# Patient Record
Sex: Female | Born: 1945 | Race: White | Hispanic: No | Smoking: Never smoker
Health system: Southern US, Community
[De-identification: ages and names within clinical notes are randomized; demographics above are authoritative.]

## PROBLEM LIST (undated history)

## (undated) DIAGNOSIS — Z8719 Personal history of other diseases of the digestive system: Secondary | ICD-10-CM

## (undated) DIAGNOSIS — F32A Depression, unspecified: Secondary | ICD-10-CM

## (undated) DIAGNOSIS — F329 Major depressive disorder, single episode, unspecified: Secondary | ICD-10-CM

## (undated) DIAGNOSIS — M810 Age-related osteoporosis without current pathological fracture: Secondary | ICD-10-CM

## (undated) DIAGNOSIS — K219 Gastro-esophageal reflux disease without esophagitis: Secondary | ICD-10-CM

## (undated) DIAGNOSIS — Z86718 Personal history of other venous thrombosis and embolism: Secondary | ICD-10-CM

## (undated) DIAGNOSIS — T7840XA Allergy, unspecified, initial encounter: Secondary | ICD-10-CM

## (undated) DIAGNOSIS — Z860100 Personal history of colon polyps, unspecified: Secondary | ICD-10-CM

## (undated) DIAGNOSIS — R011 Cardiac murmur, unspecified: Secondary | ICD-10-CM

## (undated) DIAGNOSIS — E785 Hyperlipidemia, unspecified: Secondary | ICD-10-CM

## (undated) HISTORY — DX: Allergy, unspecified, initial encounter: T78.40XA

## (undated) HISTORY — DX: Hyperlipidemia, unspecified: E78.5

## (undated) HISTORY — DX: Age-related osteoporosis without current pathological fracture: M81.0

## (undated) HISTORY — DX: Depression, unspecified: F32.A

## (undated) HISTORY — DX: Major depressive disorder, single episode, unspecified: F32.9

## (undated) HISTORY — DX: Personal history of other diseases of the digestive system: Z87.19

## (undated) HISTORY — DX: Personal history of other venous thrombosis and embolism: Z86.718

## (undated) HISTORY — DX: Cardiac murmur, unspecified: R01.1

## (undated) HISTORY — PX: DILATION AND CURETTAGE OF UTERUS: SHX78

## (undated) HISTORY — PX: KNEE SURGERY: SHX244

## (undated) HISTORY — DX: Gastro-esophageal reflux disease without esophagitis: K21.9

## (undated) HISTORY — DX: Personal history of colon polyps, unspecified: Z86.0100

## (undated) HISTORY — PX: SHOULDER SURGERY: SHX246

## (undated) HISTORY — DX: Personal history of colonic polyps: Z86.010

---

## 1970-04-19 HISTORY — PX: APPENDECTOMY: SHX54

## 1999-04-20 HISTORY — PX: LEG SURGERY: SHX1003

## 2011-01-06 DIAGNOSIS — M79604 Pain in right leg: Secondary | ICD-10-CM | POA: Insufficient documentation

## 2011-01-06 DIAGNOSIS — I872 Venous insufficiency (chronic) (peripheral): Secondary | ICD-10-CM | POA: Insufficient documentation

## 2014-04-19 HISTORY — PX: HIATAL HERNIA REPAIR: SHX195

## 2015-02-20 ENCOUNTER — Ambulatory Visit (INDEPENDENT_AMBULATORY_CARE_PROVIDER_SITE_OTHER): Payer: Medicare Other | Admitting: Medical

## 2015-02-20 ENCOUNTER — Encounter: Payer: Self-pay | Admitting: Medical

## 2015-02-20 VITALS — BP 120/80 | HR 88 | Temp 98.3°F | Ht 63.0 in | Wt 213.0 lb

## 2015-02-20 DIAGNOSIS — S0101XD Laceration without foreign body of scalp, subsequent encounter: Secondary | ICD-10-CM | POA: Diagnosis not present

## 2015-02-20 DIAGNOSIS — Z4802 Encounter for removal of sutures: Secondary | ICD-10-CM | POA: Diagnosis not present

## 2015-02-20 NOTE — Progress Notes (Signed)
Subjective:    Patient ID: Suszanne ConnersShelia Valadez, female    DOB: 02/11/1946, 69 y.o.   MRN: [id number]  HPI   I have reviewed pt PMH, PSH, FH, Social History and Surgical History  Pt states moved here 2 yrs ago from [place].  States has some food allergies but not tested by allergist.  Depression- Pt is on Wellbutrin and Celexa. Pt states old primary care doctor has refilled past 2 years.   Hyperlipidemia- not on any meds. Never took any medications for. Pt thinks she does not need med.  Genella RifeGerd- very rare reflux. Under control for 5 yrs. Will get very rare occasional reflux.  Both parents died car accidents. Dad ate age 71 yo. Mom age 71 yo.   Pt had scalp laceration Tuesday a week ago. Pt fell in her bathroom. Cause of fall not known. She was little dizzy and work up for this. Kept overnight. Pt states work up in hospital was negative.  Pt her to follow up for removal of sutures. Pt was not clear how many days she was told to remove sutures. Pt states this was not on discharge.      Review of Systems  Constitutional: Negative for fever, chills, diaphoresis, activity change and fatigue.  Respiratory: Negative for cough, chest tightness and shortness of breath.   Cardiovascular: Negative for chest pain, palpitations and leg swelling.  Gastrointestinal: Negative for nausea, vomiting and abdominal pain.  Musculoskeletal: Negative for back pain, neck pain and neck stiffness.  Skin:       Scalp laceration.  Neurological: Negative for dizziness, syncope, facial asymmetry, speech difficulty, weakness and headaches.  Psychiatric/Behavioral: Negative for behavioral problems, confusion and agitation. The patient is not nervous/anxious.     Past Medical History  Diagnosis Date  . Allergy     food allergies and some additives. But she does not know what will cause.  . Depression   . GERD (gastroesophageal reflux disease)     had in past but now controlled. Has hiatal hernia.  .  Hyperlipidemia     Social History   Social History  . Marital Status: Divorced    Spouse Name: N/A  . Number of Children: N/A  . Years of Education: N/A   Occupational History  . Not on file.   Social History Main Topics  . Smoking status: Never Smoker   . Smokeless tobacco: Never Used  . Alcohol Use: 0.0 oz/week    0 Standard drinks or equivalent per week     Comment: pt  drinks one glass hard cidar at dinner each   . Drug Use: No  . Sexual Activity: No   Other Topics Concern  . Not on file   Social History Narrative  . No narrative on file    Past Surgical History  Procedure Laterality Date  . Knee surgery Bilateral   . Shoulder surgery Right     Family History  Problem Relation Age of Onset  . Diabetes Mother     No Known Allergies  No current outpatient prescriptions on file prior to visit.   No current facility-administered medications on file prior to visit.    BP 120/80 mmHg  Pulse 88  Temp(Src) 98.3 F (36.8 C) (Oral)  Ht 5\' 3"  (1.6 m)  Wt 213 lb (96.616 kg)  BMI 37.74 kg/m2  SpO2 98%       Objective:   Physical Exam   General Mental Status- Alert. General Appearance- Not in acute distress.  Skin General: Color- Normal Color. Moisture- Normal Moisture.  Neck Carotid Arteries- Normal color. Moisture- Normal Moisture. No carotid bruits. No JVD.  Chest and Lung Exam Auscultation: Breath Sounds:-Normal.  Cardiovascular Auscultation:Rythm- Regular. Murmurs & Other Heart Sounds:Auscultation of the heart reveals- No Murmurs.  Abdomen Inspection:-Inspeection Normal. Palpation/Percussion:Note:No mass. Palpation and Percussion of the abdomen reveal- Non Tender, Non Distended + BS, no rebound or guarding.    Neurologic Cranial Nerve exam:- CN III-XII intact(No nystagmus), symmetric smile. Finger to Nose:- Normal/Intact Strength:- 5/5 equal and symmetric strength both upper and lower extremities.     Assessment & Plan:    Removed your sutures today.(8 total)  Pat wash and dry area over next 3 days.  Area should not open up. But since I did not place sutures be cautious.  Follow up in 2-3 wks for and recommend come fasting in the am will get lipid panel and cmp.

## 2015-02-20 NOTE — Progress Notes (Signed)
Pre visit review using our clinic review tool, if applicable. No additional management support is needed unless otherwise documented below in the visit note. 

## 2015-02-20 NOTE — Patient Instructions (Signed)
Removed your sutures today.(8 total)  Pat wash and dry area over next 3 days.  Area should not open up. But since I did not place sutures be cautious.  Follow up in 2-3 wks for and recommend come fasting in the am will get lipid panel and cmp.

## 2015-02-21 ENCOUNTER — Ambulatory Visit: Payer: Self-pay | Admitting: Family

## 2015-03-07 ENCOUNTER — Ambulatory Visit: Payer: Self-pay | Admitting: Family

## 2015-04-23 ENCOUNTER — Telehealth: Payer: Self-pay | Admitting: *Deleted

## 2015-04-23 ENCOUNTER — Other Ambulatory Visit: Payer: Self-pay | Admitting: Family

## 2015-04-23 ENCOUNTER — Ambulatory Visit: Payer: Medicare Other | Admitting: Family

## 2015-04-23 MED ORDER — BUPROPION HCL ER (XL) 300 MG PO TB24: 300.0000 mg | ORAL_TABLET | ORAL | Status: DC

## 2015-04-23 MED ORDER — CITALOPRAM HYDROBROMIDE 40 MG PO TABS: 40.0000 mg | ORAL_TABLET | Freq: Every day | ORAL | Status: DC

## 2015-04-23 NOTE — Telephone Encounter (Signed)
OK 

## 2015-04-23 NOTE — Telephone Encounter (Signed)
30 days supply sent. Notified pt.

## 2015-04-23 NOTE — Telephone Encounter (Signed)
Pt came in to the office for her appointment today and did not realize that she cancelled her appt through the automated reminder system (has done this 3 times now). Pt has r/s to establish care with Peggyann Juba'Sullivan, NP for 05/01/14 but is need of refills on wellbutrin and celexa. Pt saw Ramon Dredgedward for an acute visit on 02/20/15. Ok to send 30 day supply to pharmacy?

## 2015-05-02 ENCOUNTER — Encounter: Payer: Self-pay | Admitting: Family

## 2015-05-02 ENCOUNTER — Ambulatory Visit (INDEPENDENT_AMBULATORY_CARE_PROVIDER_SITE_OTHER): Payer: Medicare Other | Admitting: Family

## 2015-05-02 VITALS — BP 143/73 | HR 78 | Temp 98.6°F | Resp 18 | Ht 63.0 in | Wt 213.0 lb

## 2015-05-02 DIAGNOSIS — E785 Hyperlipidemia, unspecified: Secondary | ICD-10-CM | POA: Insufficient documentation

## 2015-05-02 DIAGNOSIS — F32A Depression, unspecified: Secondary | ICD-10-CM

## 2015-05-02 DIAGNOSIS — F329 Major depressive disorder, single episode, unspecified: Secondary | ICD-10-CM

## 2015-05-02 DIAGNOSIS — Z86718 Personal history of other venous thrombosis and embolism: Secondary | ICD-10-CM | POA: Diagnosis not present

## 2015-05-02 DIAGNOSIS — L900 Lichen sclerosus et atrophicus: Secondary | ICD-10-CM | POA: Diagnosis not present

## 2015-05-02 DIAGNOSIS — K219 Gastro-esophageal reflux disease without esophagitis: Secondary | ICD-10-CM | POA: Insufficient documentation

## 2015-05-02 LAB — CBC WITH DIFFERENTIAL/PLATELET
BASOS ABS: 0 10*3/uL (ref 0.0–0.1)
Basophils Relative: 1 % (ref 0–1)
Eosinophils Absolute: 0.1 10*3/uL (ref 0.0–0.7)
Eosinophils Relative: 3 % (ref 0–5)
HEMATOCRIT: 39.5 % (ref 36.0–46.0)
HEMOGLOBIN: 12.9 g/dL (ref 12.0–15.0)
LYMPHS ABS: 1.6 10*3/uL (ref 0.7–4.0)
LYMPHS PCT: 34 % (ref 12–46)
MCH: 27.5 pg (ref 26.0–34.0)
MCHC: 32.7 g/dL (ref 30.0–36.0)
MCV: 84.2 fL (ref 78.0–100.0)
MONOS PCT: 6 % (ref 3–12)
MPV: 8.9 fL (ref 8.6–12.4)
Monocytes Absolute: 0.3 10*3/uL (ref 0.1–1.0)
NEUTROS ABS: 2.7 10*3/uL (ref 1.7–7.7)
NEUTROS PCT: 56 % (ref 43–77)
Platelets: 237 10*3/uL (ref 150–400)
RBC: 4.69 MIL/uL (ref 3.87–5.11)
RDW: 14.3 % (ref 11.5–15.5)
WBC: 4.8 10*3/uL (ref 4.0–10.5)

## 2015-05-02 LAB — LIPID PANEL
CHOLESTEROL: 234 mg/dL — AB (ref 125–200)
HDL: 52 mg/dL (ref >=46)
LDL Cholesterol: 157 mg/dL — ABNORMAL HIGH (ref <130)
TRIGLYCERIDES: 124 mg/dL (ref <150)
Total CHOL/HDL Ratio: 4.5 Ratio (ref <=5.0)
VLDL: 25 mg/dL (ref <30)

## 2015-05-02 MED ORDER — ASPIRIN EC 81 MG PO TBEC: 81.0000 mg | DELAYED_RELEASE_TABLET | Freq: Every day | ORAL | Status: DC

## 2015-05-02 MED ORDER — RANITIDINE HCL 150 MG PO TABS: 150.0000 mg | ORAL_TABLET | Freq: Two times a day (BID) | ORAL | Status: DC

## 2015-05-02 NOTE — Patient Instructions (Addendum)
Please complete lab work prior to leaving. Call your insurance and ask them if it would be less expensive for you to take the standard release wellbutrin 150mg  twice daily rather than the 300mg  of the extended release once daily.  Add aspirin 81mg  once daily to help prevent heart attack and stroke. Add zantac 150mg  twice daily (this is available over the counter).

## 2015-05-02 NOTE — Progress Notes (Signed)
Subjective:    Patient ID: Judy Weiss, female    DOB: 04/26/1945, 70 y.o.   MRN: [id number]  HPI  Judy Weiss is a 70 yr old female who presents today to establish care.  1) Depression- she is on citalopram and wellbutrin for "years."  She reports good mood on this regimen.  Wellbutrin became more expensive.  She denies anxiety. Recently moved from [place] and reports that she is adjusting.  She uses ambien or sleep prn.  Insurance will only pay for 90 tabs/12 months. Usually only takes 1/2 tab.    2) GERD- Reports hx of hiatal hernia.  Only bothers her if she overeats.  Tries to eat small amounts.  She describes some cough, upper airway wheeze and intermittent hoarseness.   3) Hyperlipidemia- she has never taken meds.    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  Later had vein stripping in that leg.  Also had remote episode of L leg cellulitis.  Food intolerances- Reports HA with MSG, swelling with too much salt.  Spices can maker flush and cause HA.  Reports occasional welts but not sure what causes the welts .  Denies hx of tongue/lip swelling. She has not seen allergist recently, declines referral at this time.   Lichen sclerosis- seeing dermatology. She is being treated intermittently with a steroid cream.    Review of Systems See HPI  Past Medical History  Diagnosis Date  . Allergy     food allergies and some additives. But she does not know what will cause.  . Depression   . GERD (gastroesophageal reflux disease)     had in past but now controlled. Has hiatal hernia.  . Hyperlipidemia   . Heart murmur   . History of colon polyps   . History of blood clots 1996?    left leg    Social History   Social History  . Marital Status: Divorced    Spouse Name: N/A  . Number of Children: N/A  . Years of Education: N/A   Occupational History  . Not on file.   Social History Main Topics  . Smoking status: Never Smoker   . Smokeless tobacco: Never Used  . Alcohol  Use: 4.2 oz/week    7 Standard drinks or equivalent per week     Comment: pt  drinks one glass of hard cidar at dinner each day  . Drug Use: No  . Sexual Activity: No   Other Topics Concern  . 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   Divorced   4 cats and a dog   Enjoys antiquing.  Buys and sells.  Enjoys shopping    Past Surgical History  Procedure Laterality Date  . Knee surgery Bilateral     hx of torn meniscus (arthroscopic surgery)  . Shoulder surgery Right     reports hx arthroscopic surgery  . Leg surgery Left 2001    Had veins stripped   . Appendectomy  1972    Family History  Problem Relation Age of Onset  . Diabetes Mother   . Hypothyroidism Mother     No Known Allergies  Current Outpatient Prescriptions on File Prior to Visit  Medication  Sig Dispense Refill  . buPROPion (WELLBUTRIN XL) 300 MG 24 hr tablet Take 1 tablet (300 mg total) by mouth every morning. 30 tablet 0  . citalopram (CELEXA) 40 MG tablet Take 1 tablet (40 mg total) by mouth daily. 30 tablet 0   No current facility-administered medications on file prior to visit.    BP 143/73 mmHg  Pulse 78  Temp(Src) 98.6 F (37 C) (Oral)  Resp 18  Ht 5\' 3"  (1.6 m)  Wt 213 lb (96.616 kg)  BMI 37.74 kg/m2  SpO2 97%       Objective:   Physical Exam  Constitutional: She is oriented to person, place, and time. She appears well-developed and well-nourished.  HENT:  Head: Normocephalic and atraumatic.  Cardiovascular: Normal rate, regular rhythm and normal heart sounds.   No murmur heard. Pulmonary/Chest: Effort normal and breath sounds normal. No respiratory distress. She has no wheezes.  Musculoskeletal: She exhibits no edema.  1+ bilateral LE edema  Neurological: She is alert  and oriented to person, place, and time.  Psychiatric: She has a normal mood and affect. Her behavior is normal. Judgment and thought content normal.          Assessment & Plan:  Pt does report hx of anemia and recent PIC (ice). Check CBC.

## 2015-05-02 NOTE — Progress Notes (Signed)
Pre visit review using our clinic review tool, if applicable. No additional management support is needed unless otherwise documented below in the visit note. 

## 2015-05-02 NOTE — Assessment & Plan Note (Signed)
She reports that this is now stable and is being treated by dermatology.

## 2015-05-02 NOTE — Assessment & Plan Note (Signed)
Occurred while on OCP.

## 2015-05-02 NOTE — Assessment & Plan Note (Signed)
Stable on current meds.  Continue same. 

## 2015-05-02 NOTE — Assessment & Plan Note (Signed)
Obtain lipid panel

## 2015-05-02 NOTE — Assessment & Plan Note (Signed)
Trial of zantac.  Hopefully this will help wheeze/cough and hoarsenss.

## 2015-05-03 ENCOUNTER — Encounter: Payer: Self-pay | Admitting: Family

## 2015-05-13 ENCOUNTER — Telehealth: Payer: Self-pay | Admitting: Family

## 2015-05-13 MED ORDER — ZOLPIDEM TARTRATE 10 MG PO TABS: 10.0000 mg | ORAL_TABLET | Freq: Every evening | ORAL | Status: DC | PRN

## 2015-05-13 NOTE — Telephone Encounter (Signed)
Rx called to pharmacy voicemail. 

## 2015-05-13 NOTE — Telephone Encounter (Signed)
OK to call in ambien 30 tabs.

## 2015-05-13 NOTE — Telephone Encounter (Signed)
Relation to ZO:XWRU Call back number: [phone] Pharmacy:  [id number] - [place], [place] - [address] [phone] (Phone) [phone] (Fax)         Reason for call:  Patient requesting a refill zolpidem (AMBIEN) 10 MG tablet. Patient states she contacted insurance as you advised and there is no difference in the cost of  buPROPion (WELLBUTRIN XL) 300 MG 24 hr tablet if its taking twice a day.

## 2015-05-13 NOTE — Telephone Encounter (Signed)
Spoke with pt. She is needing zolpidem now. She is new to [place] and we have not filled Rx before. Pt states she doesn't need wellbutrin at this time but checked with her insurance and co-pay is the same if she takes it once a day or twice a day.  Please advise?

## 2015-05-22 ENCOUNTER — Telehealth: Payer: Self-pay | Admitting: Family

## 2015-05-22 NOTE — Telephone Encounter (Signed)
Documented 05/22/15.  

## 2015-05-22 NOTE — Telephone Encounter (Signed)
Pt states she does not take the flu shot °

## 2015-06-05 ENCOUNTER — Other Ambulatory Visit: Payer: Self-pay | Admitting: Family

## 2015-06-05 NOTE — Telephone Encounter (Signed)
[id number] - [place], [place] - [address]  Pt would like 3 month supply of each med. Out of citalopram. Has 4 left of buPROPion .

## 2015-06-05 NOTE — Telephone Encounter (Signed)
Last OV 05/02/15  Zolpidem, wellbutrin, and citalopram were all last filled 05/13/15 #30 with 0. Pt is requesting for each med to be filled for 90 days.   Please advise if ok?

## 2015-06-06 MED ORDER — BUPROPION HCL ER (XL) 300 MG PO TB24: 300.0000 mg | ORAL_TABLET | ORAL | Status: DC

## 2015-06-06 MED ORDER — CITALOPRAM HYDROBROMIDE 40 MG PO TABS: 40.0000 mg | ORAL_TABLET | Freq: Every day | ORAL | Status: DC

## 2015-06-06 MED ORDER — ZOLPIDEM TARTRATE 10 MG PO TABS: 10.0000 mg | ORAL_TABLET | Freq: Every evening | ORAL | Status: DC | PRN

## 2015-06-06 NOTE — Telephone Encounter (Signed)
90 day of wellbutrin and citalopram sent, unable to provid 90 day of ambien due to controlled substance status.

## 2015-06-06 NOTE — Telephone Encounter (Signed)
Rx faxed to local pharmacy. Notified pt.

## 2015-08-11 DIAGNOSIS — K219 Gastro-esophageal reflux disease without esophagitis: Secondary | ICD-10-CM | POA: Diagnosis not present

## 2015-08-11 DIAGNOSIS — R1010 Upper abdominal pain, unspecified: Secondary | ICD-10-CM | POA: Diagnosis not present

## 2015-08-11 DIAGNOSIS — K449 Diaphragmatic hernia without obstruction or gangrene: Secondary | ICD-10-CM | POA: Diagnosis not present

## 2015-08-13 DIAGNOSIS — R0689 Other abnormalities of breathing: Secondary | ICD-10-CM | POA: Diagnosis not present

## 2015-08-13 DIAGNOSIS — K449 Diaphragmatic hernia without obstruction or gangrene: Secondary | ICD-10-CM | POA: Diagnosis not present

## 2015-08-14 DIAGNOSIS — Z01419 Encounter for gynecological examination (general) (routine) without abnormal findings: Secondary | ICD-10-CM | POA: Diagnosis not present

## 2015-08-14 DIAGNOSIS — Z6837 Body mass index (BMI) 37.0-37.9, adult: Secondary | ICD-10-CM | POA: Diagnosis not present

## 2015-08-14 DIAGNOSIS — Z1231 Encounter for screening mammogram for malignant neoplasm of breast: Secondary | ICD-10-CM | POA: Diagnosis not present

## 2015-08-28 DIAGNOSIS — K449 Diaphragmatic hernia without obstruction or gangrene: Secondary | ICD-10-CM | POA: Diagnosis not present

## 2015-09-09 ENCOUNTER — Telehealth: Payer: Self-pay | Admitting: Family

## 2015-09-09 ENCOUNTER — Ambulatory Visit (INDEPENDENT_AMBULATORY_CARE_PROVIDER_SITE_OTHER): Payer: Medicare Other | Admitting: Family

## 2015-09-09 ENCOUNTER — Other Ambulatory Visit: Payer: Self-pay | Admitting: Emergency Medicine

## 2015-09-09 ENCOUNTER — Encounter: Payer: Self-pay | Admitting: Family

## 2015-09-09 VITALS — BP 119/60 | HR 76 | Temp 98.6°F | Ht 63.0 in | Wt 209.8 lb

## 2015-09-09 DIAGNOSIS — M26629 Arthralgia of temporomandibular joint, unspecified side: Secondary | ICD-10-CM | POA: Insufficient documentation

## 2015-09-09 DIAGNOSIS — R0602 Shortness of breath: Secondary | ICD-10-CM

## 2015-09-09 DIAGNOSIS — I8390 Asymptomatic varicose veins of unspecified lower extremity: Secondary | ICD-10-CM | POA: Diagnosis not present

## 2015-09-09 DIAGNOSIS — R609 Edema, unspecified: Secondary | ICD-10-CM | POA: Diagnosis not present

## 2015-09-09 DIAGNOSIS — M79605 Pain in left leg: Secondary | ICD-10-CM | POA: Diagnosis not present

## 2015-09-09 DIAGNOSIS — M26621 Arthralgia of right temporomandibular joint: Secondary | ICD-10-CM

## 2015-09-09 DIAGNOSIS — Z86718 Personal history of other venous thrombosis and embolism: Secondary | ICD-10-CM | POA: Diagnosis not present

## 2015-09-09 DIAGNOSIS — M79604 Pain in right leg: Secondary | ICD-10-CM | POA: Diagnosis not present

## 2015-09-09 DIAGNOSIS — R600 Localized edema: Secondary | ICD-10-CM | POA: Diagnosis not present

## 2015-09-09 LAB — BRAIN NATRIURETIC PEPTIDE: Pro B Natriuretic peptide (BNP): 57 pg/mL (ref 0.0–100.0)

## 2015-09-09 LAB — TROPONIN I: TNIDX: 0 ug/L (ref 0.00–0.06)

## 2015-09-09 LAB — D-DIMER, QUANTITATIVE: D-Dimer, Quant: 1.57 ug/mL-FEU — ABNORMAL HIGH (ref 0.00–0.48)

## 2015-09-09 MED ORDER — ZOLPIDEM TARTRATE 10 MG PO TABS: 10.0000 mg | ORAL_TABLET | Freq: Every evening | ORAL | Status: DC | PRN

## 2015-09-09 MED ORDER — CITALOPRAM HYDROBROMIDE 40 MG PO TABS: 40.0000 mg | ORAL_TABLET | Freq: Every day | ORAL | Status: DC

## 2015-09-09 MED ORDER — BUPROPION HCL ER (XL) 300 MG PO TB24: 300.0000 mg | ORAL_TABLET | ORAL | Status: DC

## 2015-09-09 NOTE — Telephone Encounter (Deleted)
Received rx request for Zolpidem from [facility].

## 2015-09-09 NOTE — Assessment & Plan Note (Signed)
Will address further next visit after above work up is complete.

## 2015-09-09 NOTE — Progress Notes (Signed)
Subjective:    Patient ID: Judy Weiss, female    DOB: 1945-10-25, 70 y.o.   MRN: [id number]  HPI  Judy Weiss is a 70 yr old female who presents today with chief complaint of SOB.  SOB has been present x 3 months.  SOB is intermittent but occurs on a daily basis. Worse after she eats.  SOB is also worsened by exertion.  She denies CP but reports that she has a sensation of chest tightness.  This is worst at night after she lays down.  She denies recent GERD symptoms.  She reports that she has chronic LLE swelling but has been worse in the AM's. Reports that she has not had any recent long travel. She has hx of hiatal hernia.   Jaw pain- Reports that she wakes up at night with right sided jaw pain.  She denies hx of clenching, has "no teeth" to grind in the back.      Review of Systems    see HPI  Past Medical History  Diagnosis Date  . Allergy     food allergies and some additives. But she does not know what will cause.  . Depression   . GERD (gastroesophageal reflux disease)     had in past but now controlled. Has hiatal hernia.  . Hyperlipidemia   . Heart murmur   . History of colon polyps   . History of blood clots 1996?    left leg     Social History   Social History  . Marital Status: Divorced    Spouse Name: N/A  . Number of Children: N/A  . Years of Education: N/A   Occupational History  . Not on file.   Social History Main Topics  . Smoking status: Never Smoker   . Smokeless tobacco: Never Used  . Alcohol Use: 4.2 oz/week    7 Standard drinks or equivalent per week     Comment: pt  drinks one glass of hard cidar at dinner each day  . Drug Use: No  . Sexual Activity: No   Other Topics Concern  . 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   Divorced   4 cats and a dog   Enjoys antiquing.  Buys and sells.  Enjoys shopping    Past Surgical History  Procedure Laterality Date  . Knee surgery Bilateral     hx of torn meniscus (arthroscopic surgery)  . Shoulder surgery Right     reports hx arthroscopic surgery  . Leg surgery Left 2001    Had veins stripped   . Appendectomy  1972    Family History  Problem Relation Age of Onset  . Diabetes Mother   . Hypothyroidism Mother     No Known Allergies  Current Outpatient Prescriptions on File Prior to Visit  Medication Sig Dispense Refill  . aspirin EC 81 MG tablet Take 1 tablet (81 mg total) by mouth daily. (Patient taking differently: Take 2 tablets by mouth daily.)    . buPROPion (WELLBUTRIN XL) 300 MG 24 hr tablet Take 1 tablet (300 mg total) by mouth every morning. 90 tablet 1  . citalopram (CELEXA) 40 MG tablet Take 1 tablet (40 mg total) by  mouth daily. 90 tablet 1  . zolpidem (AMBIEN) 10 MG tablet Take 1 tablet (10 mg total) by mouth at bedtime as needed. 30 tablet 0   No current facility-administered medications on file prior to visit.    BP 119/60 mmHg  Pulse 76  Temp(Src) 98.6 F (37 C) (Oral)  Ht  (1.6 m)  Wt 209 lb 12.8 oz (95.165 kg)  BMI 37.17 kg/m2  SpO2 100%    Objective:   Physical Exam  Constitutional: She is oriented to person, place, and time. She appears well-developed and well-nourished.  HENT:  Head: Normocephalic and atraumatic.  Cardiovascular: Normal rate, regular rhythm and normal heart sounds.   No murmur heard. Pulmonary/Chest: Effort normal and breath sounds normal. No respiratory distress. She has no wheezes.  Musculoskeletal:  2-3+ bilateral LE edema  + jaw click noted left  Neurological: She is alert and oriented to person, place, and time.  Psychiatric: She has a normal mood and affect. Her behavior is normal. Judgment and thought content normal.          Assessment & Plan:  SOB-  Possible etiology includes  volume overload, PE, cardiac etiology.  EKG is performed and personally reviewed.  No old EKG available for comparison.  EKG shows normal sinus rhythm without acute ischemic changes. Will obtain stat troponin, D dimer ( to rule out PE). Will also send for ETT, 2D echo to further evaluate and a bilateral LE doppler to rule out dvt given prior hx of DVT and increased swelling. LE doppler to be done this afternoon. Case reviewed with Dr. Abner Greenspan.

## 2015-09-09 NOTE — Telephone Encounter (Signed)
Received message from [facility] re: neg bilateral LE doppler.  Reviewed elevated d dimer.  Spoke with pt, [facility] [place].Pt agrees to proceed now to ED.  Report given to ED physician on duty at the [facility].

## 2015-09-09 NOTE — Patient Instructions (Addendum)
Please complete lab work prior to leaving. We will contact you with your results this evening.  Complete US of the legs on the first floor to rule out blood clot in your legs.  You will be contacted about your exercise stress test and echocardiogram.  Go to the ER if you develop severe/worsening sob or chest tightness.

## 2015-09-10 ENCOUNTER — Encounter (HOSPITAL_BASED_OUTPATIENT_CLINIC_OR_DEPARTMENT_OTHER): Payer: Self-pay

## 2015-09-10 ENCOUNTER — Ambulatory Visit (HOSPITAL_BASED_OUTPATIENT_CLINIC_OR_DEPARTMENT_OTHER)
Admission: RE | Admit: 2015-09-10 | Discharge: 2015-09-10 | Disposition: A | Discharge status: Home / Self Care | Payer: Medicare Other | Source: Ambulatory Visit | Attending: Family | Admitting: Family

## 2015-09-10 ENCOUNTER — Other Ambulatory Visit: Payer: Self-pay | Admitting: Family

## 2015-09-10 ENCOUNTER — Telehealth: Payer: Self-pay | Admitting: Family

## 2015-09-10 ENCOUNTER — Ambulatory Visit (HOSPITAL_BASED_OUTPATIENT_CLINIC_OR_DEPARTMENT_OTHER): Admission: RE | Admit: 2015-09-10 | Payer: Medicare Other | Source: Ambulatory Visit

## 2015-09-10 ENCOUNTER — Other Ambulatory Visit (INDEPENDENT_AMBULATORY_CARE_PROVIDER_SITE_OTHER): Payer: Medicare Other

## 2015-09-10 DIAGNOSIS — K449 Diaphragmatic hernia without obstruction or gangrene: Secondary | ICD-10-CM | POA: Insufficient documentation

## 2015-09-10 DIAGNOSIS — R791 Abnormal coagulation profile: Secondary | ICD-10-CM | POA: Insufficient documentation

## 2015-09-10 DIAGNOSIS — R7989 Other specified abnormal findings of blood chemistry: Secondary | ICD-10-CM

## 2015-09-10 DIAGNOSIS — R0602 Shortness of breath: Secondary | ICD-10-CM | POA: Diagnosis not present

## 2015-09-10 LAB — BASIC METABOLIC PANEL
BUN: 14 mg/dL (ref 6–23)
CHLORIDE: 106 meq/L (ref 96–112)
CO2: 31 meq/L (ref 19–32)
Calcium: 9.4 mg/dL (ref 8.4–10.5)
Creatinine, Ser: 0.82 mg/dL (ref 0.40–1.20)
GFR: 72.76 mL/min (ref >60.00)
GLUCOSE: 115 mg/dL — AB (ref 70–99)
POTASSIUM: 4.8 meq/L (ref 3.5–5.1)
SODIUM: 140 meq/L (ref 135–145)

## 2015-09-10 MED ORDER — IOPAMIDOL (ISOVUE-370) INJECTION 76%
100.0000 mL | Freq: Once | INTRAVENOUS | Status: AC | PRN
  Administered 2015-09-10: 100 mL via INTRAVENOUS

## 2015-09-10 NOTE — Telephone Encounter (Signed)
Spoke to patient. Advised her of CT findings + for severe hiatal hernia. Neg for PE. Advised pt this is likely cause for her SOB.  Advised pt that I would like to get her in to see surgeon ASAP to discuss repair and that in the meantime, should SOB worsen she is to go directly to the ED.  Pt verbalizes understanding.   Victorino DikeJennifer, can you please try to get her in to see surgery tomorrow (thursday) Friday or Monday at the latest?  Let me know if you have trouble getting her in.

## 2015-09-10 NOTE — Telephone Encounter (Signed)
Received call from radiology that pt did not show for CT angio at 2pm. Called pt. She states she didn't think she could still have test at 2pm today since she was late getting to the lab (12:30) and was previously told it would take us 3 hours to get the result. Spoke with imaging and r/s CT angio for 6pm today, they will do venous doppler at 6:30. Notified pt to be in [facility]ology at [facility] at 6pm today and she voices understanding.

## 2015-09-10 NOTE — Telephone Encounter (Signed)
Zolpidem Rx faxed to pharmacy.

## 2015-09-10 NOTE — Telephone Encounter (Signed)
Please contact pt and see if she went to the ED last night. I do not see any records that she went.  If not, I would like her to come do a stat BMET, then a stat CTA to rule out PE today.

## 2015-09-10 NOTE — Telephone Encounter (Signed)
Notified pt of below. She states she did go to the ER at the [facility] but they didn't have anyone there that could do the test because there was no order in the system for the CT angio. Called the oncall doctor and was told to go to another ER but pt states she "did not do that as she had already had an evaluation and did not need to see another doctor, just needed to have the CT".  Apologized to pt for the misunderstanding and advised her that we need to have her complete STAT lab and do CT this afternoon. Pt states she is going to sleep for a couple of hours and will return to our lab around 10 / 11am. CT scheduled for 2pm. Pt aware.

## 2015-09-11 NOTE — Telephone Encounter (Signed)
Hernia's are not considered urgent dx with [facility], I will refer to [facility]. Their office can usually get in rather quickly.

## 2015-09-11 NOTE — Telephone Encounter (Signed)
Informaton forwarded to Jackelyn KnifeJ Sebastian for referral appointment.

## 2015-09-11 NOTE — Telephone Encounter (Signed)
If that is ok with you, please advise.

## 2015-09-11 NOTE — Telephone Encounter (Addendum)
That is fine.  This is urgent because it is a hiatal hernia that is up inside her chest cavity so it is causing her respiratory issues.  Whichever office can get her in sooner if fine. Thanks.

## 2015-09-16 ENCOUNTER — Telehealth: Payer: Self-pay | Admitting: Family

## 2015-09-16 NOTE — Telephone Encounter (Signed)
What is the status of her surgical referral please?

## 2015-09-17 NOTE — Telephone Encounter (Signed)
Patient is scheduled for 6/12 with Dr Duane Lopeeppra

## 2015-09-18 NOTE — Telephone Encounter (Signed)
Left message for Dr. Duane Lopeeppra to discuss patient case and try to move up her appointment sooner.

## 2015-09-19 NOTE — Telephone Encounter (Signed)
Left message on pt's home # to call re: change in surgical consultation as below. [address] [facility] [address], [place], [place] [id number]

## 2015-09-19 NOTE — Telephone Encounter (Signed)
Notified pt and she states that Dr Chancy Hurtereppara's office called and has scheduled her for consultation on Monday, 09/22/15 at 4pm. Pt concerned that PCP may prefer [facility]. Notified PCP and she states she is ok for pt to keep appt with [facility] since they can see her earlier. Notified pt. Per PCP, confirm with [facility] that they will be seeing pt on Monday then call [facility] to cancel appt.

## 2015-09-19 NOTE — Telephone Encounter (Signed)
Please contact patient and let her know that I spoke to [facility] and was able to move her appointment  Up to 6/9 2:50 needs to arrive at 2:30.  This is a different location than Dr. Buzzy Haneppara.  Please give her address and cancel apt with Dr. Buzzy Haneppara in [place]. Thanks.

## 2015-09-19 NOTE — Telephone Encounter (Signed)
Attempted to reach [facility] at [phone] and received message that the office is closed. Will try on Monday.

## 2015-09-22 NOTE — Telephone Encounter (Signed)
Spoke with [facility] and states Dr Buzzy Haneppara no longer with them and to call [phone] [facility]). Verified that pt has appt with Dr Buzzy Haneppara today at 4pm. Cancelled appt with [facility] on 09/26/15.

## 2015-09-25 ENCOUNTER — Ambulatory Visit (HOSPITAL_COMMUNITY): Payer: Medicare Other

## 2015-10-14 ENCOUNTER — Other Ambulatory Visit: Payer: Self-pay

## 2015-10-14 ENCOUNTER — Ambulatory Visit (HOSPITAL_COMMUNITY): Payer: Medicare Other | Attending: Cardiology

## 2015-10-14 DIAGNOSIS — I358 Other nonrheumatic aortic valve disorders: Secondary | ICD-10-CM | POA: Insufficient documentation

## 2015-10-14 DIAGNOSIS — R0602 Shortness of breath: Secondary | ICD-10-CM | POA: Insufficient documentation

## 2015-10-14 DIAGNOSIS — E785 Hyperlipidemia, unspecified: Secondary | ICD-10-CM | POA: Diagnosis not present

## 2015-10-14 LAB — ECHOCARDIOGRAM COMPLETE
AOASC: 40 cm
CHL CUP DOP CALC LVOT VTI: 29.9 cm
CHL CUP MV DEC (S): 215
CHL CUP TV REG PEAK VELOCITY: 273 cm/s
E/e' ratio: 12.01
EWDT: 215 ms
FS: 19 % — AB (ref 28–44)
IVS/LV PW RATIO, ED: 1.13
LA diam end sys: 30 mm
LA diam index: 1.43 cm/m2
LA vol A4C: 47.2 ml
LA vol: 52.3 mL
LASIZE: 30 mm
LAVOLIN: 24.9 mL/m2
LDCA: 3.14 cm2
LV E/e' medial: 12.01
LV E/e'average: 12.01
LV e' LATERAL: 8.16 cm/s
LVOTD: 20 mm
LVOTPV: 108 cm/s
LVOTSV: 94 mL
Lateral S' vel: 14.5 cm/s
MV Peak grad: 4 mmHg
MV pk A vel: 102 m/s
MVPKEVEL: 98 m/s
PW: 8.31 mm — AB (ref 0.6–1.1)
RV sys press: 38 mmHg
TAPSE: 19.8 mm
TDI e' lateral: 8.16
TDI e' medial: 6.2
TRMAXVEL: 273 cm/s

## 2015-10-15 ENCOUNTER — Telehealth: Payer: Self-pay | Admitting: *Deleted

## 2015-10-15 NOTE — Telephone Encounter (Signed)
Notified pt and she states she does not have history of hypertension and reports that her BP always runs low. Wants to know if there are any other causes?  Please advise.

## 2015-10-15 NOTE — Telephone Encounter (Signed)
-----   Message from Sandford CrazeMelissa O'Sullivan, NP sent at 10/15/2015  2:40 PM EDT ----- Echocardiogram shows strong heart.  Does note diastolic dysfunction. This occurs when ventricles do not properly relax and become stiff.  This is a common finding in people with long standing history of hypertension.

## 2015-10-15 NOTE — Telephone Encounter (Signed)
Age can sometimes be a factor.  It is a common finding and hers is mild.

## 2015-10-16 NOTE — Telephone Encounter (Signed)
Notified pt and she voices understanding. 

## 2015-10-17 DIAGNOSIS — K449 Diaphragmatic hernia without obstruction or gangrene: Secondary | ICD-10-CM | POA: Diagnosis not present

## 2015-11-24 ENCOUNTER — Other Ambulatory Visit (HOSPITAL_COMMUNITY): Payer: Self-pay | Admitting: General Surgery

## 2015-11-24 DIAGNOSIS — K449 Diaphragmatic hernia without obstruction or gangrene: Secondary | ICD-10-CM

## 2015-12-02 ENCOUNTER — Ambulatory Visit (HOSPITAL_COMMUNITY): Payer: Medicare Other

## 2015-12-02 ENCOUNTER — Telehealth: Payer: Self-pay | Admitting: Family

## 2015-12-02 ENCOUNTER — Encounter (HOSPITAL_COMMUNITY)
Admission: RE | Admit: 2015-12-02 | Discharge: 2015-12-02 | Disposition: A | Discharge status: Home / Self Care | Payer: Medicare Other | Source: Ambulatory Visit | Attending: General Surgery | Admitting: General Surgery

## 2015-12-02 DIAGNOSIS — K449 Diaphragmatic hernia without obstruction or gangrene: Secondary | ICD-10-CM

## 2015-12-02 DIAGNOSIS — R1010 Upper abdominal pain, unspecified: Secondary | ICD-10-CM | POA: Diagnosis not present

## 2015-12-02 MED ORDER — TECHNETIUM TC 99M SULFUR COLLOID
2.0000 | Freq: Once | INTRAVENOUS | Status: AC | PRN
  Administered 2015-12-02: 2 via INTRAVENOUS

## 2015-12-02 NOTE — Telephone Encounter (Signed)
Caller name: Relationship to patient: Self Can be reached: [phone] Pharmacy:  [id number] - [place], [place] - [address] [facility] [address] [phone] (Phone) [phone] (Fax)     Reason for call: Request refill on Ambien

## 2015-12-03 MED ORDER — ZOLPIDEM TARTRATE 10 MG PO TABS: 10.0000 mg | ORAL_TABLET | Freq: Every evening | ORAL | 0 refills | Status: DC | PRN

## 2015-12-03 NOTE — Telephone Encounter (Signed)
Last refill:  09/09/15, #30 Last OV: 09/09/15 Next OV:  None scheduled. UDS:  none  Rx printed and forwarded to PCP for signature.  Please advise when pt is due for follow up.

## 2015-12-03 NOTE — Telephone Encounter (Signed)
Pt will need a 6 month follow up.

## 2015-12-03 NOTE — Telephone Encounter (Signed)
Shiquita-- please call pt to schedule a 6 month f/u with Melissa around 03/11/16.  Thanks! Rx faxed to pharmacy at 12:20pm.

## 2015-12-04 NOTE — Telephone Encounter (Signed)
Informed patient of need for OV in November. Patient states she will call back to schedule

## 2015-12-25 ENCOUNTER — Ambulatory Visit: Payer: Self-pay | Admitting: General Surgery

## 2015-12-25 DIAGNOSIS — K449 Diaphragmatic hernia without obstruction or gangrene: Secondary | ICD-10-CM | POA: Diagnosis not present

## 2015-12-25 NOTE — H&P (Signed)
History of Present Illness Judy Filler MD; 12/25/2015 10:43 AM) The patient is a 70 year old female who presents with a hiatal hernia. Patient returns today for follow-up after a Gastrografin study., Gastric emptying study reveals normal gastric emptying. I re-last studied her previous CT scan in 09/10/2015 which revealed a large type 4 hiatal hernia. Patient states that she continue with symptoms. She states that she has some shortness of breath, pressure sensation to her bilateral subcostal regions. Patient states she has a large amount reflux.  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 She states that she's had some vein stripping recently. She also has a history of GERD. _______________________________________________________ The patient is a 70 year old female who is referred by Judy Gerald, NP for evaluation of a large hiatal hernia. Patient states that she's had a history of I had a hernia for approximately 25 years. This is gotte more severe over time. Patient recently seen secondary to increased shortness of breath. Patient cardiac and pulmonary studies to evaluate any acute events. These were all about. Patient was CT scan which revealed a large hiatal hernia with entire stomach and what appears to be the pancreas in the left chest cavity.  Patient states that she seen Dr. Amil Weiss in [place]. She's undergone multiple procedures. She states that she had the surgery scheduled twice before but backed out.   Allergies (Judy Weiss, CMA; 12/25/2015 9:58 AM) No Known Drug Allergies06/30/2017  Medication History (Judy Weiss, CMA; 12/25/2015 9:58 AM) BuPROPion HCl ER (XL) (300MG  Tablet ER 24HR, Oral) Active. Citalopram Hydrobromide (40MG  Tablet, Oral) Active. Zolpidem Tartrate (10MG  Tablet, Oral) Active. Vitamin D (Cholecalciferol) (1000UNIT Capsule, Oral) Active. Vitamin B12 ( Tablet, Oral) Active. Aspirin (81MG  Tablet Chewable,  Oral) Active. DiphenhydrAMINE HCl (12.5MG /5ML Elixir, Oral) Active. Medications Reconciled    Review of Systems Judy Filler, MD; 12/25/2015 10:45 AM) General Not Present- Appetite Loss, Chills, Fatigue, Fever, Night Sweats, Weight Gain and Weight Loss. Skin Not Present- Change in Wart/Mole, Dryness, Hives, Jaundice, New Lesions, Non-Healing Wounds, Rash and Ulcer. HEENT Present- Hearing Loss, Hoarseness, Ringing in the Ears and Wears glasses/contact lenses. Not Present- Earache, Nose Bleed, Oral Ulcers, Seasonal Allergies, Sinus Pain, Sore Throat, Visual Disturbances and Yellow Eyes. Respiratory Present- Difficulty Breathing and Wheezing. Not Present- Bloody sputum, Chronic Cough and Snoring. Breast Not Present- Breast Mass, Breast Pain, Nipple Discharge and Skin Changes. Cardiovascular Present- Leg Cramps, Shortness of Breath and Swelling of Extremities. Not Present- Chest Pain, Difficulty Breathing Lying Down, Palpitations and Rapid Heart Rate. Gastrointestinal Present- Bloating, Excessive gas and Hemorrhoids. Not Present- Abdominal Pain, Bloody Stool, Change in Bowel Habits, Chronic diarrhea, Constipation, Difficulty Swallowing, Gets full quickly at meals, Indigestion, Nausea, Rectal Pain and Vomiting. Female Genitourinary Not Present- Frequency, Nocturia, Painful Urination, Pelvic Pain and Urgency. Musculoskeletal Present- Back Pain and Joint Pain. Not Present- Joint Stiffness, Muscle Pain, Muscle Weakness and Swelling of Extremities. Neurological Not Present- Decreased Memory, Fainting, Headaches, Numbness, Seizures, Tingling, Tremor, Trouble walking and Weakness. Psychiatric Present- Depression. Not Present- Anxiety, Bipolar, Change in Sleep Pattern, Fearful and Frequent crying. Endocrine Present- Hair Changes. Not Present- Cold Intolerance, Excessive Hunger, Heat Intolerance, Hot flashes and New Diabetes. Hematology Not Present- Easy Bruising, Excessive bleeding, Gland problems, HIV  and Persistent Infections.  Vitals (Judy Weiss CMA; 12/25/2015 9:58 AM) 12/25/2015 9:58 AM Weight: 206 lb Height: 63in Body Surface Area: 1.96 m Body Mass Index: 36.49 kg/m  Temp.: 97.15F(Temporal)  Pulse: 76 (Regular)  BP: 130/82 (Sitting, Left Arm, Standard)  Physical Exam Judy Weiss(Judy Sorto MD; 12/25/2015 10:41 AM) General Mental Status-Alert. General Appearance-Consistent with stated age. Hydration-Well hydrated. Voice-Normal.  Chest and Lung Exam Chest and lung exam reveals -quiet, even and easy respiratory effort with no use of accessory muscles and on auscultation, normal breath sounds, no adventitious sounds and normal vocal resonance. Inspection Chest Wall - Normal. Back - normal.  Cardiovascular Cardiovascular examination reveals -normal heart sounds, regular rate and rhythm with no murmurs and normal pedal pulses bilaterally.  Abdomen Inspection Inspection of the abdomen reveals - No Hernias. Skin - Scar - no surgical scars. Palpation/Percussion Palpation and Percussion of the abdomen reveal - Soft, Non Tender, No Rebound tenderness, No Rigidity (guarding) and No hepatosplenomegaly. Auscultation Auscultation of the abdomen reveals - Bowel sounds normal.  Musculoskeletal Note: Scoliosis     Assessment & Plan Judy Weiss(Judy Cedar MD; 12/25/2015 10:44 AM) HIATAL HERNIA (K44.9) Impression: Patient is a 70 year old female with a very large type IV hiatal hernia.  1. The patient will like to proceed to the operating for a robotic hiatal hernia repair, Nissen fundoplication, gastropexy. 2. I discussed with her the risks and benefits of the procedure to include but not limited to: Infection, bleeding, damage to structures, possible pneumothorax, possible drain placement, possible recurrence, possible esophageal injury as well as extended MPO time.  Patient was understanding and wished to proceed.

## 2016-01-06 ENCOUNTER — Encounter (HOSPITAL_COMMUNITY): Payer: Self-pay

## 2016-01-06 ENCOUNTER — Encounter (INDEPENDENT_AMBULATORY_CARE_PROVIDER_SITE_OTHER): Payer: Self-pay

## 2016-01-06 ENCOUNTER — Encounter (HOSPITAL_COMMUNITY)
Admission: RE | Admit: 2016-01-06 | Discharge: 2016-01-06 | Disposition: A | Discharge status: Home / Self Care | Payer: Medicare Other | Source: Ambulatory Visit | Attending: General Surgery | Admitting: General Surgery

## 2016-01-06 DIAGNOSIS — Z01812 Encounter for preprocedural laboratory examination: Secondary | ICD-10-CM

## 2016-01-06 DIAGNOSIS — K449 Diaphragmatic hernia without obstruction or gangrene: Secondary | ICD-10-CM | POA: Insufficient documentation

## 2016-01-06 HISTORY — DX: Personal history of other diseases of the digestive system: Z87.19

## 2016-01-06 LAB — BASIC METABOLIC PANEL
ANION GAP: 4 — AB (ref 5–15)
BUN: 14 mg/dL (ref 6–20)
CALCIUM: 9.4 mg/dL (ref 8.9–10.3)
CO2: 29 mmol/L (ref 22–32)
CREATININE: 0.91 mg/dL (ref 0.44–1.00)
Chloride: 109 mmol/L (ref 101–111)
GFR calc Af Amer: >60 mL/min (ref >60)
GFR calc non Af Amer: >60 mL/min (ref >60)
Glucose, Bld: 120 mg/dL — ABNORMAL HIGH (ref 65–99)
Potassium: 4.9 mmol/L (ref 3.5–5.1)
SODIUM: 142 mmol/L (ref 135–145)

## 2016-01-06 LAB — CBC
HCT: 40.7 % (ref 36.0–46.0)
HEMOGLOBIN: 12.7 g/dL (ref 12.0–15.0)
MCH: 27.1 pg (ref 26.0–34.0)
MCHC: 31.2 g/dL (ref 30.0–36.0)
MCV: 87 fL (ref 78.0–100.0)
PLATELETS: 241 10*3/uL (ref 150–400)
RBC: 4.68 MIL/uL (ref 3.87–5.11)
RDW: 13.9 % (ref 11.5–15.5)
WBC: 4.9 10*3/uL (ref 4.0–10.5)

## 2016-01-06 NOTE — Patient Instructions (Addendum)
Judy ConnersShelia Weiss  01/06/2016   Your procedure is scheduled on: Friday 01/09/2016  [facility]  Entrance take [place]  elevators to 3rd floor to  Short Stay Center at  0945   AM.  Call this number if you have problems the morning of surgery [phone]   Remember: ONLY 1 PERSON MAY GO WITH YOU TO SHORT STAY TO GET  READY MORNING OF YOUR SURGERY.   Do not eat food or drink liquids :After Midnight.     Take these medicines the morning of surgery with A SIP OF WATER: Bupropion (Wellbutrin XL), Citalopram (Celexa)                                 You may not have any metal on your body including hair pins and              piercings  Do not wear jewelry, make-up, lotions, powders or perfumes, deodorant             Do not wear nail polish.  Do not shave  48 hours prior to surgery.              Men may shave face and neck.   Do not bring valuables to the hospital. [facility] IS NOT             RESPONSIBLE   FOR VALUABLES.  Contacts, dentures or bridgework may not be worn into surgery.  Leave suitcase in the car. After surgery it may be brought to your room.                  Please read over the following fact sheets you were given: _____________________________________________________________________[facility] - Preparing for Surgery Before surgery, you can play an important role.  Because skin is not sterile, your skin needs to be as free of germs as possible.  You can reduce the number of germs on your skin by washing with CHG (chlorahexidine gluconate) soap before surgery.  CHG is an antiseptic cleaner which kills germs and bonds with the skin to continue killing germs even after washing. Please DO NOT use if you have an allergy to CHG or antibacterial soaps.  If your skin becomes reddened/irritated stop using the CHG and inform your nurse when you arrive at Short Stay. Do not shave (including legs and underarms) for at least 48 hours prior to the first  CHG shower.  You may shave your face/neck. Please follow these instructions carefully:  1.  Shower with CHG Soap the night before surgery and the  morning of Surgery.  2.  If you choose to wash your hair, wash your hair first as usual with your  normal  shampoo.  3.  After you shampoo, rinse your hair and body thoroughly to remove the  shampoo.                           4.  Use CHG as you would any other liquid soap.  You can apply chg directly  to the skin and wash                       Gently with a scrungie or clean washcloth.  5.  Apply the CHG Soap to your body ONLY  FROM THE NECK DOWN.   Do not use on face/ open                           Wound or open sores. Avoid contact with eyes, ears mouth and genitals (private parts).                       Wash face,  Genitals (private parts) with your normal soap.             6.  Wash thoroughly, paying special attention to the area where your surgery  will be performed.  7.  Thoroughly rinse your body with warm water from the neck down.  8.  DO NOT shower/wash with your normal soap after using and rinsing off  the CHG Soap.                9.  Pat yourself dry with a clean towel.            10.  Wear clean pajamas.            11.  Place clean sheets on your bed the night of your first shower and do not  sleep with pets. Day of Surgery : Do not apply any lotions/deodorants the morning of surgery.  Please wear clean clothes to the hospital/surgery center.  FAILURE TO FOLLOW THESE INSTRUCTIONS MAY RESULT IN THE CANCELLATION OF YOUR SURGERY PATIENT SIGNATURE_________________________________  NURSE SIGNATURE__________________________________  ________________________________________________________________________

## 2016-01-06 NOTE — Progress Notes (Signed)
09/09/2015- noted EKG in EPIC. 05/24/2017noted CXR in EPIC.

## 2016-01-09 ENCOUNTER — Telehealth: Payer: Self-pay | Admitting: Family

## 2016-01-09 ENCOUNTER — Inpatient Hospital Stay (HOSPITAL_COMMUNITY)
Admission: AD | Admit: 2016-01-09 | Discharge: 2016-01-16 | DRG: 326 | Disposition: A | Discharge status: Home Health | Payer: Medicare Other | Source: Ambulatory Visit | Attending: General Surgery | Admitting: General Surgery

## 2016-01-09 ENCOUNTER — Encounter (HOSPITAL_COMMUNITY): Payer: Self-pay

## 2016-01-09 ENCOUNTER — Ambulatory Visit (HOSPITAL_COMMUNITY): Payer: Medicare Other | Admitting: Certified Registered Nurse Anesthetist

## 2016-01-09 ENCOUNTER — Encounter (HOSPITAL_COMMUNITY)
Admission: AD | Disposition: A | Discharge status: Home Health | Payer: Self-pay | Source: Ambulatory Visit | Attending: General Surgery

## 2016-01-09 DIAGNOSIS — J939 Pneumothorax, unspecified: Secondary | ICD-10-CM

## 2016-01-09 DIAGNOSIS — F1020 Alcohol dependence, uncomplicated: Secondary | ICD-10-CM

## 2016-01-09 DIAGNOSIS — J690 Pneumonitis due to inhalation of food and vomit: Secondary | ICD-10-CM | POA: Diagnosis not present

## 2016-01-09 DIAGNOSIS — Z86718 Personal history of other venous thrombosis and embolism: Secondary | ICD-10-CM

## 2016-01-09 DIAGNOSIS — F419 Anxiety disorder, unspecified: Secondary | ICD-10-CM | POA: Diagnosis present

## 2016-01-09 DIAGNOSIS — E669 Obesity, unspecified: Secondary | ICD-10-CM | POA: Diagnosis present

## 2016-01-09 DIAGNOSIS — R410 Disorientation, unspecified: Secondary | ICD-10-CM

## 2016-01-09 DIAGNOSIS — K219 Gastro-esophageal reflux disease without esophagitis: Secondary | ICD-10-CM | POA: Diagnosis present

## 2016-01-09 DIAGNOSIS — F32A Depression, unspecified: Secondary | ICD-10-CM | POA: Diagnosis present

## 2016-01-09 DIAGNOSIS — M419 Scoliosis, unspecified: Secondary | ICD-10-CM | POA: Diagnosis present

## 2016-01-09 DIAGNOSIS — Z931 Gastrostomy status: Secondary | ICD-10-CM

## 2016-01-09 DIAGNOSIS — F329 Major depressive disorder, single episode, unspecified: Secondary | ICD-10-CM | POA: Diagnosis not present

## 2016-01-09 DIAGNOSIS — E785 Hyperlipidemia, unspecified: Secondary | ICD-10-CM | POA: Diagnosis present

## 2016-01-09 DIAGNOSIS — K449 Diaphragmatic hernia without obstruction or gangrene: Principal | ICD-10-CM | POA: Diagnosis present

## 2016-01-09 DIAGNOSIS — J930 Spontaneous tension pneumothorax: Secondary | ICD-10-CM | POA: Diagnosis not present

## 2016-01-09 DIAGNOSIS — Z833 Family history of diabetes mellitus: Secondary | ICD-10-CM | POA: Diagnosis not present

## 2016-01-09 DIAGNOSIS — Z6833 Body mass index (BMI) 33.0-33.9, adult: Secondary | ICD-10-CM

## 2016-01-09 DIAGNOSIS — R0902 Hypoxemia: Secondary | ICD-10-CM | POA: Diagnosis not present

## 2016-01-09 DIAGNOSIS — Z7982 Long term (current) use of aspirin: Secondary | ICD-10-CM

## 2016-01-09 DIAGNOSIS — Z79899 Other long term (current) drug therapy: Secondary | ICD-10-CM

## 2016-01-09 DIAGNOSIS — K224 Dyskinesia of esophagus: Secondary | ICD-10-CM | POA: Diagnosis not present

## 2016-01-09 DIAGNOSIS — F411 Generalized anxiety disorder: Secondary | ICD-10-CM | POA: Diagnosis not present

## 2016-01-09 DIAGNOSIS — R079 Chest pain, unspecified: Secondary | ICD-10-CM | POA: Diagnosis not present

## 2016-01-09 DIAGNOSIS — R0602 Shortness of breath: Secondary | ICD-10-CM | POA: Diagnosis not present

## 2016-01-09 HISTORY — PX: INSERTION OF MESH: SHX5868

## 2016-01-09 SURGERY — FUNDOPLICATION, NISSEN, ROBOT-ASSISTED, LAPAROSCOPIC
Anesthesia: General

## 2016-01-09 MED ORDER — ONDANSETRON HCL 4 MG/2ML IJ SOLN
INTRAMUSCULAR | Status: DC | PRN
  Administered 2016-01-09: 4 mg via INTRAVENOUS

## 2016-01-09 MED ORDER — FENTANYL CITRATE (PF) 100 MCG/2ML IJ SOLN
INTRAMUSCULAR | Status: DC | PRN
  Administered 2016-01-09: 50 ug via INTRAVENOUS
  Administered 2016-01-09: 25 ug via INTRAVENOUS
  Administered 2016-01-09: 75 ug via INTRAVENOUS
  Administered 2016-01-09: 25 ug via INTRAVENOUS
  Administered 2016-01-09: 50 ug via INTRAVENOUS
  Administered 2016-01-09 (×3): 25 ug via INTRAVENOUS

## 2016-01-09 MED ORDER — ROCURONIUM BROMIDE 10 MG/ML (PF) SYRINGE
PREFILLED_SYRINGE | INTRAVENOUS | Status: AC
  Filled 2016-01-09: qty 10

## 2016-01-09 MED ORDER — BUPIVACAINE-EPINEPHRINE (PF) 0.5% -1:200000 IJ SOLN
INTRAMUSCULAR | Status: DC | PRN
  Administered 2016-01-09: 20 mL via PERINEURAL

## 2016-01-09 MED ORDER — CHLORHEXIDINE GLUCONATE CLOTH 2 % EX PADS: 6.0000 | MEDICATED_PAD | Freq: Once | CUTANEOUS | Status: DC

## 2016-01-09 MED ORDER — HYDROMORPHONE HCL 1 MG/ML IJ SOLN
0.2500 mg | INTRAMUSCULAR | Status: DC | PRN
  Administered 2016-01-09 (×2): 0.25 mg via INTRAVENOUS
  Administered 2016-01-09: 0.5 mg via INTRAVENOUS
  Administered 2016-01-09: 0.25 mg via INTRAVENOUS
  Administered 2016-01-09: 0.5 mg via INTRAVENOUS
  Administered 2016-01-09: 0.25 mg via INTRAVENOUS

## 2016-01-09 MED ORDER — DEXTROSE-NACL 5-0.9 % IV SOLN
INTRAVENOUS | Status: DC
  Administered 2016-01-10: 13:00:00 via INTRAVENOUS
  Administered 2016-01-10 (×2): 1000 mL via INTRAVENOUS
  Administered 2016-01-11: 05:00:00 via INTRAVENOUS

## 2016-01-09 MED ORDER — SUGAMMADEX SODIUM 200 MG/2ML IV SOLN
INTRAVENOUS | Status: AC
  Filled 2016-01-09: qty 2

## 2016-01-09 MED ORDER — CEFAZOLIN SODIUM-DEXTROSE 2-4 GM/100ML-% IV SOLN
INTRAVENOUS | Status: AC
  Filled 2016-01-09: qty 100

## 2016-01-09 MED ORDER — FENTANYL CITRATE (PF) 100 MCG/2ML IJ SOLN
25.0000 ug | INTRAMUSCULAR | Status: DC | PRN
  Administered 2016-01-09: 50 ug via INTRAVENOUS

## 2016-01-09 MED ORDER — PROPOFOL 10 MG/ML IV BOLUS
INTRAVENOUS | Status: DC | PRN
  Administered 2016-01-09: 180 mg via INTRAVENOUS

## 2016-01-09 MED ORDER — SUCCINYLCHOLINE CHLORIDE 20 MG/ML IJ SOLN
INTRAMUSCULAR | Status: DC | PRN
  Administered 2016-01-09: 50 mg via INTRAVENOUS

## 2016-01-09 MED ORDER — PROMETHAZINE HCL 25 MG/ML IJ SOLN
6.2500 mg | INTRAMUSCULAR | Status: DC | PRN
Start: 2016-01-09 — End: 2016-01-09

## 2016-01-09 MED ORDER — HYDROMORPHONE HCL 1 MG/ML IJ SOLN
INTRAMUSCULAR | Status: AC
  Administered 2016-01-09: 0.25 mg via INTRAVENOUS
  Filled 2016-01-09: qty 1

## 2016-01-09 MED ORDER — 0.9 % SODIUM CHLORIDE (POUR BTL) OPTIME
TOPICAL | Status: DC | PRN
Start: 2016-01-09 — End: 2016-01-09
  Administered 2016-01-09: 1000 mL

## 2016-01-09 MED ORDER — DEXAMETHASONE SODIUM PHOSPHATE 10 MG/ML IJ SOLN
INTRAMUSCULAR | Status: DC | PRN
  Administered 2016-01-09: 10 mg via INTRAVENOUS

## 2016-01-09 MED ORDER — HYDROCODONE-ACETAMINOPHEN 7.5-325 MG/15ML PO SOLN: 10.0000 mL | Freq: Four times a day (QID) | ORAL | 0 refills | Status: DC | PRN

## 2016-01-09 MED ORDER — PHENYLEPHRINE HCL 10 MG/ML IJ SOLN
INTRAVENOUS | Status: DC | PRN
  Administered 2016-01-09: 25 ug/min via INTRAVENOUS

## 2016-01-09 MED ORDER — CEFAZOLIN SODIUM-DEXTROSE 2-4 GM/100ML-% IV SOLN
2.0000 g | INTRAVENOUS | Status: AC
  Administered 2016-01-09: 2 g via INTRAVENOUS
  Filled 2016-01-09: qty 100

## 2016-01-09 MED ORDER — FENTANYL CITRATE (PF) 250 MCG/5ML IJ SOLN
INTRAMUSCULAR | Status: AC
  Filled 2016-01-09: qty 5

## 2016-01-09 MED ORDER — ACETAMINOPHEN 10 MG/ML IV SOLN
INTRAVENOUS | Status: AC
  Filled 2016-01-09: qty 100

## 2016-01-09 MED ORDER — LACTATED RINGERS IR SOLN
Status: DC | PRN
  Administered 2016-01-09: 1000 mL

## 2016-01-09 MED ORDER — ONDANSETRON 4 MG PO TBDP: 4.0000 mg | ORAL_TABLET | Freq: Four times a day (QID) | ORAL | Status: DC | PRN

## 2016-01-09 MED ORDER — ENOXAPARIN SODIUM 40 MG/0.4ML ~~LOC~~ SOLN
40.0000 mg | SUBCUTANEOUS | Status: DC
  Administered 2016-01-09: 40 mg via SUBCUTANEOUS
  Filled 2016-01-09: qty 0.4

## 2016-01-09 MED ORDER — BUPIVACAINE-EPINEPHRINE 0.5% -1:200000 IJ SOLN
INTRAMUSCULAR | Status: AC
  Filled 2016-01-09: qty 1

## 2016-01-09 MED ORDER — EPHEDRINE SULFATE 50 MG/ML IJ SOLN
INTRAMUSCULAR | Status: DC | PRN
  Administered 2016-01-09: 10 mg via INTRAVENOUS
  Administered 2016-01-09: 5 mg via INTRAVENOUS
  Administered 2016-01-09 (×3): 10 mg via INTRAVENOUS

## 2016-01-09 MED ORDER — ROCURONIUM BROMIDE 100 MG/10ML IV SOLN
INTRAVENOUS | Status: DC | PRN
  Administered 2016-01-09: 10 mg via INTRAVENOUS
  Administered 2016-01-09 (×2): 20 mg via INTRAVENOUS
  Administered 2016-01-09: 5 mg via INTRAVENOUS
  Administered 2016-01-09: 30 mg via INTRAVENOUS
  Administered 2016-01-09: 5 mg via INTRAVENOUS
  Administered 2016-01-09: 10 mg via INTRAVENOUS

## 2016-01-09 MED ORDER — PHENYLEPHRINE HCL 10 MG/ML IJ SOLN
INTRAMUSCULAR | Status: AC
  Filled 2016-01-09: qty 1

## 2016-01-09 MED ORDER — FENTANYL CITRATE (PF) 100 MCG/2ML IJ SOLN
INTRAMUSCULAR | Status: AC
  Filled 2016-01-09: qty 2

## 2016-01-09 MED ORDER — LACTATED RINGERS IV SOLN
INTRAVENOUS | Status: DC
  Administered 2016-01-09 (×3): via INTRAVENOUS

## 2016-01-09 MED ORDER — PROPOFOL 10 MG/ML IV BOLUS
INTRAVENOUS | Status: AC
  Filled 2016-01-09: qty 20

## 2016-01-09 MED ORDER — ONDANSETRON HCL 4 MG/2ML IJ SOLN: 4.0000 mg | Freq: Four times a day (QID) | INTRAMUSCULAR | Status: DC | PRN

## 2016-01-09 MED ORDER — HYDROMORPHONE HCL 1 MG/ML IJ SOLN
1.0000 mg | INTRAMUSCULAR | Status: DC | PRN
  Administered 2016-01-09 – 2016-01-10 (×9): 1 mg via INTRAVENOUS
  Administered 2016-01-11 (×2): 2 mg via INTRAVENOUS
  Filled 2016-01-09: qty 2
  Filled 2016-01-09 (×2): qty 1
  Filled 2016-01-09: qty 2
  Filled 2016-01-09 (×9): qty 1

## 2016-01-09 MED ORDER — DEXAMETHASONE SODIUM PHOSPHATE 10 MG/ML IJ SOLN
INTRAMUSCULAR | Status: AC
  Filled 2016-01-09: qty 1

## 2016-01-09 MED ORDER — LIDOCAINE HCL (CARDIAC) 20 MG/ML IV SOLN
INTRAVENOUS | Status: DC | PRN
  Administered 2016-01-09: 100 mg via INTRAVENOUS

## 2016-01-09 MED ORDER — ACETAMINOPHEN 10 MG/ML IV SOLN
1000.0000 mg | Freq: Once | INTRAVENOUS | Status: AC
  Administered 2016-01-09: 1000 mg via INTRAVENOUS

## 2016-01-09 MED ORDER — LIDOCAINE 2% (20 MG/ML) 5 ML SYRINGE
INTRAMUSCULAR | Status: AC
  Filled 2016-01-09: qty 5

## 2016-01-09 MED ORDER — ONDANSETRON HCL 4 MG/2ML IJ SOLN
INTRAMUSCULAR | Status: AC
  Filled 2016-01-09: qty 2

## 2016-01-09 MED ORDER — ARTIFICIAL TEARS OP OINT
TOPICAL_OINTMENT | OPHTHALMIC | Status: AC
  Filled 2016-01-09: qty 3.5

## 2016-01-09 MED ORDER — SUGAMMADEX SODIUM 200 MG/2ML IV SOLN
INTRAVENOUS | Status: DC | PRN
  Administered 2016-01-09: 200 mg via INTRAVENOUS

## 2016-01-09 SURGICAL SUPPLY — 62 items
APPLIER CLIP 5 13 M/L LIGAMAX5 (MISCELLANEOUS)
APPLIER CLIP ROT 10 11.4 M/L (STAPLE)
BIOPATCH BLUE 3/4IN DISK W/1.5 (GAUZE/BANDAGES/DRESSINGS) ×2 IMPLANT
BLADE SURG SZ11 CARB STEEL (BLADE) ×2 IMPLANT
CHLORAPREP W/TINT 26ML (MISCELLANEOUS) ×2 IMPLANT
CLIP APPLIE 5 13 M/L LIGAMAX5 (MISCELLANEOUS) IMPLANT
CLIP APPLIE ROT 10 11.4 M/L (STAPLE) IMPLANT
CLIP LIGATING HEM O LOK PURPLE (MISCELLANEOUS) IMPLANT
CLIP LIGATING HEMO O LOK GREEN (MISCELLANEOUS) IMPLANT
COVER TIP SHEARS 8 DVNC (MISCELLANEOUS) IMPLANT
COVER TIP SHEARS 8MM DA VINCI (MISCELLANEOUS)
DECANTER SPIKE VIAL GLASS SM (MISCELLANEOUS) ×2 IMPLANT
DEVICE TROCAR PUNCTURE CLOSURE (ENDOMECHANICALS) IMPLANT
DRAIN CHANNEL 19F RND (DRAIN) ×2 IMPLANT
DRAIN PENROSE 18X1/2 LTX STRL (DRAIN) ×2 IMPLANT
DRAPE ARM DVNC X/XI (DISPOSABLE) ×4 IMPLANT
DRAPE COLUMN DVNC XI (DISPOSABLE) ×1 IMPLANT
DRAPE DA VINCI XI ARM (DISPOSABLE) ×4
DRAPE DA VINCI XI COLUMN (DISPOSABLE) ×1
DRAPE SHEET LG 3/4 BI-LAMINATE (DRAPES) IMPLANT
ELECT REM PT RETURN 9FT ADLT (ELECTROSURGICAL) ×2
ELECTRODE REM PT RTRN 9FT ADLT (ELECTROSURGICAL) ×1 IMPLANT
ENDOLOOP SUT PDS II  0 18 (SUTURE)
ENDOLOOP SUT PDS II 0 18 (SUTURE) IMPLANT
EVACUATOR SILICONE 100CC (DRAIN) ×2 IMPLANT
GAUZE SPONGE 4X4 16PLY XRAY LF (GAUZE/BANDAGES/DRESSINGS) ×2 IMPLANT
GLOVE BIO SURGEON STRL SZ7.5 (GLOVE) ×4 IMPLANT
GOWN STRL REUS W/TWL XL LVL3 (GOWN DISPOSABLE) ×8 IMPLANT
IRRIG SUCT STRYKERFLOW 2 WTIP (MISCELLANEOUS) ×2
IRRIGATION SUCT STRKRFLW 2 WTP (MISCELLANEOUS) ×1 IMPLANT
KIT BASIN OR (CUSTOM PROCEDURE TRAY) ×2 IMPLANT
LIQUID BAND (GAUZE/BANDAGES/DRESSINGS) ×4 IMPLANT
MARKER SKIN DUAL TIP RULER LAB (MISCELLANEOUS) ×2 IMPLANT
MESH HERNIA 7X10 (Mesh General) ×2 IMPLANT
NEEDLE HYPO 22GX1.5 SAFETY (NEEDLE) ×2 IMPLANT
NEEDLE INSUFFLATION 14GA 120MM (NEEDLE) ×2 IMPLANT
PACK CARDIOVASCULAR III (CUSTOM PROCEDURE TRAY) ×2 IMPLANT
PAD POSITIONING PINK XL (MISCELLANEOUS) ×2 IMPLANT
SCISSORS LAP 5X35 DISP (ENDOMECHANICALS) ×2 IMPLANT
SEAL CANN UNIV 5-8 DVNC XI (MISCELLANEOUS) ×4 IMPLANT
SEAL XI 5MM-8MM UNIVERSAL (MISCELLANEOUS) ×4
SEALER VESSEL DA VINCI XI (MISCELLANEOUS) ×1
SEALER VESSEL EXT DVNC XI (MISCELLANEOUS) ×1 IMPLANT
SET BI-LUMEN FLTR TB AIRSEAL (TUBING) ×2 IMPLANT
SLEEVE XCEL OPT CAN 5 100 (ENDOMECHANICALS) IMPLANT
SOLUTION ANTI FOG 6CC (MISCELLANEOUS) ×2 IMPLANT
SOLUTION ELECTROLUBE (MISCELLANEOUS) ×2 IMPLANT
SPONGE DRAIN TRACH 4X4 STRL 2S (GAUZE/BANDAGES/DRESSINGS) ×2 IMPLANT
STAPLER VISISTAT 35W (STAPLE) IMPLANT
SUT ETHIBOND 0 36 GRN (SUTURE) ×6 IMPLANT
SUT ETHILON 2 0 PS N (SUTURE) ×2 IMPLANT
SUT MNCRL AB 4-0 PS2 18 (SUTURE) ×2 IMPLANT
SUT SILK 0 SH 30 (SUTURE) ×6 IMPLANT
SUT SILK 2 0 SH (SUTURE) IMPLANT
SYR 20CC LL (SYRINGE) ×2 IMPLANT
SYRINGE 10CC LL (SYRINGE) ×2 IMPLANT
TOWEL OR 17X26 10 PK STRL BLUE (TOWEL DISPOSABLE) ×2 IMPLANT
TOWEL OR NON WOVEN STRL DISP B (DISPOSABLE) ×2 IMPLANT
TRAY FOLEY W/METER SILVER 16FR (SET/KITS/TRAYS/PACK) IMPLANT
TROCAR ADV FIXATION 5X100MM (TROCAR) ×2 IMPLANT
TUBING CONNECTING 10 (TUBING) ×2 IMPLANT
TUBING ENDO SMARTCAP PENTAX (MISCELLANEOUS) ×2 IMPLANT

## 2016-01-09 NOTE — Telephone Encounter (Signed)
°  Caller name: Judeth CornfieldStephanie Relationship to patient: [employer] Can be reached: [phone]   Reason for call:  FYI: PA approved for 1 year effective today for zolpidem (AMBIEN) 10 MG tablet [[id number]

## 2016-01-09 NOTE — Anesthesia Postprocedure Evaluation (Signed)
Anesthesia Post Note  Patient: Suszanne ConnersShelia Kallstrom  Procedure(s) Performed: Procedure(s) (LRB): XI ROBOTIC HIATAL HERNIA NISSEN FUNDOPLICATION GASTROPEXY (N/A) INSERTION OF MESH (N/A)  Patient location during evaluation: PACU Anesthesia Type: General Level of consciousness: awake and alert Pain management: pain level controlled Vital Signs Assessment: post-procedure vital signs reviewed and stable Respiratory status: spontaneous breathing, nonlabored ventilation, respiratory function stable and patient connected to nasal cannula oxygen Cardiovascular status: blood pressure returned to baseline and stable Postop Assessment: no signs of nausea or vomiting Anesthetic complications: no    Last Vitals:  Vitals:   01/09/16 1715 01/09/16 1740  BP: 119/69 131/69  Pulse: 96 93  Resp: 17 16  Temp: 36.5 C 36.7 C    Last Pain:  Vitals:   01/09/16 1033  TempSrc:   PainSc: 3                  Eva Griffo J

## 2016-01-09 NOTE — Anesthesia Procedure Notes (Signed)
Procedure Name: Intubation Date/Time: 01/09/2016 12:08 PM Performed by: Orest DikesPETERS, Jahara Dail J Pre-anesthesia Checklist: Patient identified, Emergency Drugs available, Suction available and Patient being monitored Patient Re-evaluated:Patient Re-evaluated prior to inductionOxygen Delivery Method: Circle system utilized Preoxygenation: Pre-oxygenation with 100% oxygen Intubation Type: IV induction Laryngoscope Size: Miller and 2 Grade View: Grade I Tube type: Oral Tube size: 7.0 mm Number of attempts: 1 Airway Equipment and Method: Stylet Placement Confirmation: ETT inserted through vocal cords under direct vision,  positive ETCO2 and breath sounds checked- equal and bilateral Secured at: 22 cm Tube secured with: Tape Dental Injury: Teeth and Oropharynx as per pre-operative assessment and Injury to lip  Comments: RSI due to hiatal hernia. Small laceration to upper left lip, lubricant applied. Darl Pikes. Huggins, SRNA

## 2016-01-09 NOTE — Telephone Encounter (Signed)
PA initiated for Zolpidem 10 mg today Key: [id number] and has been approved from 01/09/2016 through 01/08/2017. I called and made the pharmacy aware.      KP

## 2016-01-09 NOTE — Transfer of Care (Signed)
Immediate Anesthesia Transfer of Care Note  Patient: Judy Weiss  Procedure(s) Performed: Procedure(s): XI ROBOTIC HIATAL HERNIA NISSEN FUNDOPLICATION GASTROPEXY (N/A) INSERTION OF MESH (N/A)  Patient Location: PACU  Anesthesia Type:General  Level of Consciousness:  sedated, patient cooperative and responds to stimulation  Airway & Oxygen Therapy:Patient Spontanous Breathing and Patient connected to face mask oxgen  Post-op Assessment:  Report given to PACU RN and Post -op Vital signs reviewed and stable  Post vital signs:  Reviewed and stable  Last Vitals:  Vitals:   01/09/16 1014  BP: (!) 141/91  Pulse: 86  Resp: 16  Temp: 36.7 C    Complications: No apparent anesthesia complications

## 2016-01-09 NOTE — Discharge Instructions (Signed)
EATING AFTER YOUR ESOPHAGEAL SURGERY °(Stomach Fundoplication, Hiatal Hernia repair, Achalasia surgery, etc) ° °###################################################################### ° °EAT °Start with a pureed / full liquid diet (see below) °Gradually transition to a high fiber diet with a fiber supplement over the next month after discharge.   ° °WALK °Walk an hour a day.  Control your pain to do that.   ° °CONTROL PAIN °Control pain so that you can walk, sleep, tolerate sneezing/coughing, go up/down stairs. ° °HAVE A BOWEL MOVEMENT DAILY °Keep your bowels regular to avoid problems.  OK to try a laxative to override constipation.  OK to use an antidairrheal to slow down diarrhea.  Call if not better after 2 tries ° °CALL IF YOU HAVE PROBLEMS/CONCERNS °Call if you are still struggling despite following these instructions. °Call if you have concerns not answered by these instructions ° °###################################################################### ° ° °After your esophageal surgery, expect some sticking with swallowing over the next 1-2 months.   ° °If food sticks when you eat, it is called "dysphagia".  This is due to swelling around your esophagus at the wrap & hiatal diaphragm repair.  It will gradually ease off over the next few months.  To help you through this temporary phase, we start you out on a pureed (blenderized) diet. ° °Your first meal in the hospital was thin liquids.  You should have been given a pureed diet by the time you left the hospital.  We ask patients to stay on a pureed diet for the first 2-3 weeks to avoid anything getting "stuck" near your recent surgery.  Don't be alarmed if your ability to swallow doesn't progress according to this plan.  Everyone is different and some diets can advance more or less quickly.   ° ° °Some BASIC RULES to follow are: °· Maintain an upright position whenever eating or drinking. °· Take small bites - just a teaspoon size bite at a time. °· Eat slowly.   It may also help to eat only one food at a time. °· Consider nibbling through smaller, more frequent meals & avoid the urge to eat BIG meals °· Do not push through feelings of fullness, nausea, or bloatedness °· Do not mix solid foods and liquids in the same mouthful °· Try not to "wash foods down" with large gulps of liquids. °· Avoid carbonated (bubbly/fizzy) drinks.   °· Avoid foods that make you feel gassy or bloated.  Start with bland foods first.  Wait on trying greasy, fried, or spicy meals until you are tolerating more bland solids well. °· Understand that it will be hard to burp and belch at first.  This gradually improves with time.  Expect to be more gassy/flatulent/bloated initially.  Walking will help your body manage it better. °· Consider using medications for bloating that contain simethicone such as  Maalox or Gas-X  °· Eat in a relaxed atmosphere & minimize distractions. °· Avoid talking while eating.   °· Do not use straws. °· Following each meal, sit in an upright position (90 degree angle) for 60 to 90 minutes.  Going for a short walk can help as well °· If food does stick, don't panic.  Try to relax and let the food pass on its own.  Sipping WARM LIQUID such as strong hot black tea can also help slide it down. ° ° °Be gradual in changes & use common sense: ° °-If you easily tolerating a certain "level" of foods, advance to the next level gradually °-If you are   having trouble swallowing a particular food, then avoid it.   °-If food is sticking when you advance your diet, go back to thinner previous diet (the lower LEVEL) for 1-2 days. ° °LEVEL 1 = PUREED DIET ° °Do for the first 2 WEEKS AFTER SURGERY ° °-Foods in this group are pureed or blenderized to a smooth, mashed potato-like consistency.  °-If necessary, the pureed foods can keep their shape with the addition of a thickening agent.   °-Meat should be pureed to a smooth, pasty consistency.  Hot broth or gravy may be added to the pureed  meat, approximately 1 oz. of liquid per 3 oz. serving of meat. °-CAUTION:  If any foods do not puree into a smooth consistency, swallowing will be more difficult.  (For example, nuts or seeds sometimes do not blend well.) ° °Hot Foods Cold Foods  °Pureed scrambled eggs and cheese Pureed cottage cheese  °Baby cereals Thickened juices and nectars  °Thinned cooked cereals (no lumps) Thickened milk or eggnog  °Pureed French toast or pancakes Ensure  °Mashed potatoes Ice cream  °Pureed parsley, au gratin, scalloped potatoes, candied sweet potatoes Fruit or Italian ice, sherbet  °Pureed buttered or alfredo noodles Plain yogurt  °Pureed vegetables (no corn or peas) Instant breakfast  °Pureed soups and creamed soups Smooth pudding, mousse, custard  °Pureed scalloped apples Whipped gelatin  °Gravies Sugar, syrup, honey, jelly  °Sauces, cheese, tomato, barbecue, white, creamed Cream  °Any baby food Creamer  °Alcohol in moderation (not beer or champagne) Margarine  °Coffee or tea Mayonnaise  ° Ketchup, mustard  ° Apple sauce  ° °SAMPLE MENU:  PUREED DIET °Breakfast Lunch Dinner  °· Orange juice, 1/2 cup °· Cream of wheat, 1/2 cup · Pineapple juice, 1/2 cup · Pureed turkey, barley soup, 3/4 cup °· Pureed Hawaiian chicken, 3 oz  °· Scrambled eggs, mashed or blended with cheese, 1/2 cup °· Tea or coffee, 1 cup  °· Whole milk, 1 cup  °· Non-dairy creamer, 2 Tbsp. · Mashed potatoes, 1/2 cup °· Pureed cooled broccoli, 1/2 cup °· Apple sauce, 1/2 cup °· Coffee or tea · Mashed potatoes, 1/2 cup °· Pureed spinach, 1/2 cup °· Frozen yogurt, 1/2 cup °· Tea or coffee  ° ° ° ° °LEVEL 2 = SOFT DIET ° °After your first 2 weeks, you can advance to a soft diet.   °Keep on this diet until everything goes down easily. ° °Hot Foods Cold Foods  °White fish Cottage cheese  °Stuffed fish Junior baby fruit  °Baby food meals Semi thickened juices  °Minced soft cooked, scrambled, poached eggs nectars  °Souffle & omelets Ripe mashed bananas  °Cooked  cereals Canned fruit, pineapple sauce, milk  °potatoes Milkshake  °Buttered or Alfredo noodles Custard  °Cooked cooled vegetable Puddings, including tapioca  °Sherbet Yogurt  °Vegetable soup or alphabet soup Fruit ice, Italian ice  °Gravies Whipped gelatin  °Sugar, syrup, honey, jelly Junior baby desserts  °Sauces:  Cheese, creamed, barbecue, tomato, white Cream  °Coffee or tea Margarine  ° °SAMPLE MENU:  LEVEL 2 °Breakfast Lunch Dinner  °· Orange juice, 1/2 cup °· Oatmeal, 1/2 cup °· Scrambled eggs with cheese, 1/2 cup °· Decaffeinated tea, 1 cup °· Whole milk, 1 cup °· Non-dairy creamer, 2 Tbsp · Pineapple juice, 1/2 cup °· Minced beef, 3 oz °· Gravy, 2 Tbsp °· Mashed potatoes, 1/2 cup °· Minced fresh broccoli, 1/2 cup °· Applesauce, 1/2 cup °· Coffee, 1 cup · Turkey, barley soup, 3/4 cup °·   Minced Hawaiian chicken, 3 oz °· Mashed potatoes, 1/2 cup °· Cooked spinach, 1/2 cup °· Frozen yogurt, 1/2 cup °· Non-dairy creamer, 2 Tbsp  ° ° ° ° °LEVEL 3 = CHOPPED DIET ° °-After all the foods in level 2 (soft diet) are passing through well you should advance up to more chopped foods.  °-It is still important to cut these foods into small pieces and eat slowly. ° °Hot Foods Cold Foods  °Poultry Cottage cheese  °Chopped Swedish meatballs Yogurt  °Meat salads (ground or flaked meat) Milk  °Flaked fish (tuna) Milkshakes  °Poached or scrambled eggs Soft, cold, dry cereal  °Souffles and omelets Fruit juices or nectars  °Cooked cereals Chopped canned fruit  °Chopped French toast or pancakes Canned fruit cocktail  °Noodles or pasta (no rice) Pudding, mousse, custard  °Cooked vegetables (no frozen peas, corn, or mixed vegetables) Green salad  °Canned small sweet peas Ice cream  °Creamed soup or vegetable soup Fruit ice, Italian ice  °Pureed vegetable soup or alphabet soup Non-dairy creamer  °Ground scalloped apples Margarine  °Gravies Mayonnaise  °Sauces:  Cheese, creamed, barbecue, tomato, white Ketchup  °Coffee or tea Mustard   ° °SAMPLE MENU:  LEVEL 3 °Breakfast Lunch Dinner  °· Orange juice, 1/2 cup °· Oatmeal, 1/2 cup °· Scrambled eggs with cheese, 1/2 cup °· Decaffeinated tea, 1 cup °· Whole milk, 1 cup °· Non-dairy creamer, 2 Tbsp °· Ketchup, 1 Tbsp °· Margarine, 1 tsp °· Salt, 1/4 tsp °· Sugar, 2 tsp · Pineapple juice, 1/2 cup °· Ground beef, 3 oz °· Gravy, 2 Tbsp °· Mashed potatoes, 1/2 cup °· Cooked spinach, 1/2 cup °· Applesauce, 1/2 cup °· Decaffeinated coffee °· Whole milk °· Non-dairy creamer, 2 Tbsp °· Margarine, 1 tsp °· Salt, 1/4 tsp · Pureed turkey, barley soup, 3/4 cup °· Barbecue chicken, 3 oz °· Mashed potatoes, 1/2 cup °· Ground fresh broccoli, 1/2 cup °· Frozen yogurt, 1/2 cup °· Decaffeinated tea, 1 cup °· Non-dairy creamer, 2 Tbsp °· Margarine, 1 tsp °· Salt, 1/4 tsp °· Sugar, 1 tsp  ° ° °LEVEL 4:  REGULAR FOODS ° °-Foods in this group are soft, moist, regularly textured foods.   °-This level includes meat and breads, which tend to be the hardest things to swallow.   °-Eat very slowly, chew well and continue to avoid carbonated drinks. °-most people are at this level in 4-6 weeks ° °Hot Foods Cold Foods  °Baked fish or skinned Soft cheeses - cottage cheese  °Souffles and omelets Cream cheese  °Eggs Yogurt  °Stuffed shells Milk  °Spaghetti with meat sauce Milkshakes  °Cooked cereal Cold dry cereals (no nuts, dried fruit, coconut)  °French toast or pancakes Crackers  °Buttered toast Fruit juices or nectars  °Noodles or pasta (no rice) Canned fruit  °Potatoes (all types) Ripe bananas  °Soft, cooked vegetables (no corn, lima, or baked beans) Peeled, ripe, fresh fruit  °Creamed soups or vegetable soup Cakes (no nuts, dried fruit, coconut)  °Canned chicken noodle soup Plain doughnuts  °Gravies Ice cream  °Bacon dressing Pudding, mousse, custard  °Sauces:  Cheese, creamed, barbecue, tomato, white Fruit ice, Italian ice, sherbet  °Decaffeinated tea or coffee Whipped gelatin  °Pork chops Regular gelatin  ° Canned fruited  gelatin molds  ° Sugar, syrup, honey, jam, jelly  ° Cream  ° Non-dairy  ° Margarine  ° Oil  ° Mayonnaise  ° Ketchup  ° Mustard  ° °TROUBLESHOOTING IRREGULAR BOWELS  °1) Avoid extremes of bowel   movements (no bad constipation/diarrhea)  °2) Miralax 17gm mixed in 8oz. water or juice-daily. May use BID as needed.  °3) Gas-x,Phazyme, etc. as needed for gas & bloating.  °4) Soft,bland diet. No spicy,greasy,fried foods.  °5) Prilosec over-the-counter as needed  °6) May hold gluten/wheat products from diet to see if symptoms improve.  °7) May try probiotics (Align, Activa, etc) to help calm the bowels down  °7) If symptoms become worse call back immediately. ° ° ° °If you have any questions please call our office at [facility]: [phone]. ° °

## 2016-01-09 NOTE — Telephone Encounter (Signed)
Selena BattenKim-- is this the pt you were working on for us?

## 2016-01-09 NOTE — Interval H&P Note (Signed)
History and Physical Interval Note:  01/09/2016 11:32 AM  Suszanne ConnersShelia Lloyd  has presented today for surgery, with the diagnosis of hiatal hernia  The various methods of treatment have been discussed with the patient and family. After consideration of risks, benefits and other options for treatment, the patient has consented to  Procedure(s): XI ROBOTIC HIATAL HERNIA NISSEN FUNDOPLICATION GASTROPEXY (N/A) INSERTION OF MESH (N/A) as a surgical intervention .  The patient's history has been reviewed, patient examined, no change in status, stable for surgery.  I have reviewed the patient's chart and labs.  Questions were answered to the patient's satisfaction.     Marigene Ehlersamirez Jr., Jed LimerickArmando

## 2016-01-09 NOTE — H&P (View-Only) (Signed)
History of Present Illness (Avry Roedl MD; 12/25/2015 10:43 AM) The patient is a 70 year old female who presents with a hiatal hernia. Patient returns today for follow-up after a Gastrografin study., Gastric emptying study reveals normal gastric emptying. I re-last studied her previous CT scan in 09/10/2015 which revealed a large type 4 hiatal hernia. Patient states that she continue with symptoms. She states that she has some shortness of breath, pressure sensation to her bilateral subcostal regions. Patient states she has a large amount reflux.  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 She states that she's had some vein stripping recently. She also has a history of GERD. _______________________________________________________ The patient is a 70-year-old female who is referred by Melissa Sullivan, NP for evaluation of a large hiatal hernia. Patient states that she's had a history of I had a hernia for approximately 25 years. This is gotte more severe over time. Patient recently seen secondary to increased shortness of breath. Patient cardiac and pulmonary studies to evaluate any acute events. These were all about. Patient was CT scan which revealed a large hiatal hernia with entire stomach and what appears to be the pancreas in the left chest cavity.  Patient states that she seen Dr. William Brey in [place]. She's undergone multiple procedures. She states that she had the surgery scheduled twice before but backed out.   Allergies ([name], CMA; 12/25/2015 9:58 AM) No Known Drug Allergies06/30/2017  Medication History ([name], CMA; 12/25/2015 9:58 AM) BuPROPion HCl ER (XL) (300MG Tablet ER 24HR, Oral) Active. Citalopram Hydrobromide (40MG Tablet, Oral) Active. Zolpidem Tartrate (10MG Tablet, Oral) Active. Vitamin D (Cholecalciferol) (1000UNIT Capsule, Oral) Active. Vitamin B12 (100MCG Tablet, Oral) Active. Aspirin (81MG Tablet Chewable,  Oral) Active. DiphenhydrAMINE HCl (12.5MG/5ML Elixir, Oral) Active. Medications Reconciled    Review of Systems (Mikie Misner, MD; 12/25/2015 10:45 AM) General Not Present- Appetite Loss, Chills, Fatigue, Fever, Night Sweats, Weight Gain and Weight Loss. Skin Not Present- Change in Wart/Mole, Dryness, Hives, Jaundice, New Lesions, Non-Healing Wounds, Rash and Ulcer. HEENT Present- Hearing Loss, Hoarseness, Ringing in the Ears and Wears glasses/contact lenses. Not Present- Earache, Nose Bleed, Oral Ulcers, Seasonal Allergies, Sinus Pain, Sore Throat, Visual Disturbances and Yellow Eyes. Respiratory Present- Difficulty Breathing and Wheezing. Not Present- Bloody sputum, Chronic Cough and Snoring. Breast Not Present- Breast Mass, Breast Pain, Nipple Discharge and Skin Changes. Cardiovascular Present- Leg Cramps, Shortness of Breath and Swelling of Extremities. Not Present- Chest Pain, Difficulty Breathing Lying Down, Palpitations and Rapid Heart Rate. Gastrointestinal Present- Bloating, Excessive gas and Hemorrhoids. Not Present- Abdominal Pain, Bloody Stool, Change in Bowel Habits, Chronic diarrhea, Constipation, Difficulty Swallowing, Gets full quickly at meals, Indigestion, Nausea, Rectal Pain and Vomiting. Female Genitourinary Not Present- Frequency, Nocturia, Painful Urination, Pelvic Pain and Urgency. Musculoskeletal Present- Back Pain and Joint Pain. Not Present- Joint Stiffness, Muscle Pain, Muscle Weakness and Swelling of Extremities. Neurological Not Present- Decreased Memory, Fainting, Headaches, Numbness, Seizures, Tingling, Tremor, Trouble walking and Weakness. Psychiatric Present- Depression. Not Present- Anxiety, Bipolar, Change in Sleep Pattern, Fearful and Frequent crying. Endocrine Present- Hair Changes. Not Present- Cold Intolerance, Excessive Hunger, Heat Intolerance, Hot flashes and New Diabetes. Hematology Not Present- Easy Bruising, Excessive bleeding, Gland problems, HIV  and Persistent Infections.  Vitals ([name] CMA; 12/25/2015 9:58 AM) 12/25/2015 9:58 AM Weight: 206 lb Height: 63in Body Surface Area: 1.96 m Body Mass Index: 36.49 kg/m  Temp.: 97.6F(Temporal)  Pulse: 76 (Regular)  BP: 130/82 (Sitting, Left Arm, Standard)         Physical Exam (Epifania Littrell MD; 12/25/2015 10:41 AM) General Mental Status-Alert. General Appearance-Consistent with stated age. Hydration-Well hydrated. Voice-Normal.  Chest and Lung Exam Chest and lung exam reveals -quiet, even and easy respiratory effort with no use of accessory muscles and on auscultation, normal breath sounds, no adventitious sounds and normal vocal resonance. Inspection Chest Wall - Normal. Back - normal.  Cardiovascular Cardiovascular examination reveals -normal heart sounds, regular rate and rhythm with no murmurs and normal pedal pulses bilaterally.  Abdomen Inspection Inspection of the abdomen reveals - No Hernias. Skin - Scar - no surgical scars. Palpation/Percussion Palpation and Percussion of the abdomen reveal - Soft, Non Tender, No Rebound tenderness, No Rigidity (guarding) and No hepatosplenomegaly. Auscultation Auscultation of the abdomen reveals - Bowel sounds normal.  Musculoskeletal Note: Scoliosis     Assessment & Plan (Kharter Brew MD; 12/25/2015 10:44 AM) HIATAL HERNIA (K44.9) Impression: Patient is a 70-year-old female with a very large type IV hiatal hernia.  1. The patient will like to proceed to the operating for a robotic hiatal hernia repair, Nissen fundoplication, gastropexy. 2. I discussed with her the risks and benefits of the procedure to include but not limited to: Infection, bleeding, damage to structures, possible pneumothorax, possible drain placement, possible recurrence, possible esophageal injury as well as extended MPO time.  Patient was understanding and wished to proceed. 

## 2016-01-09 NOTE — Op Note (Signed)
01/09/2016  3:55 PM  PATIENT:  Judy Weiss  70 y.o. female  PRE-OPERATIVE DIAGNOSIS:  hiatal hernia  POST-OPERATIVE DIAGNOSIS:  hiatal hernia  PROCEDURE:  Procedure(s): XI ROBOTIC HIATAL HERNIA NISSEN FUNDOPLICATION GASTROPEXY (N/A) INSERTION OF MESH (N/A)  SURGEON:  Surgeon(s) and Role:    * Axel Filler, MD - Primary    * Karie Soda, MD - Assisting  ANESTHESIA:   local and general  EBL:  Total I/O In: 1000 [I.V.:1000] Out: 50 [Blood:50]  BLOOD ADMINISTERED:none  DRAINS: (18Fr) Jackson-Pratt drain(s) with closed bulb suction in the mediastinum x1   LOCAL MEDICATIONS USED:  BUPIVICAINE   SPECIMEN:  No Specimen  DISPOSITION OF SPECIMEN:  N/A  COUNTS:  YES  TOURNIQUET:  * No tourniquets in log *  DICTATION: .Dragon Dictation  The patient was taken back to the operating room and placed in the supine position with bilateral SCDs in place. The patient was prepped and draped in the usual sterile fashion. After appropriate antibiotics were confirmed a timeout was called and all facts were verified.   A Veress needle technique was used to insufflate the abdomen to 15 mm of mercury the paramedian stab incision. Subsequent to this an 8 mm trocar was introduced as was a 8 millimeter camera. At this time the subsequent robotic trochars x3, were then placed adjacent to this trocar approximately 8-10 cm away. Each trocar was inserted under direct visualization, there were total of 4 trochars. The assistant trocar was then placed in the right lower quadrant under direct visualization. The Nathanson retractor was then visualized inserted into the abdomen and the incision just to the left of the falciform ligament. This was then placed to retract the liver appropriately. At this time the patient was positioned in reverse Trendelenburg.   At this time the robot patient cart was brought to the bedside and placed in good position and the arms were docked to the trochars appropriately.  At this time I proceeded to incised the gastrohepatic ligament.  At this time I proceeded to mobilize the stomach inferiorly and visualize the right crus. The peritoneum over the right crus was incised and right crus was identified. There was a large amount of the stomach within the hernia sac in the chest.  We worked to incise the anterior hernia sac.  Once this was taken down anteriorly, we turned our to the left crus which was dissected away. This required traction of the stomach to the right side. Once this was visualized we then proceeded to circumferentially dissect the esophagus away from the surrounding tissue. There was a large hernia sac and left chest component.  At this time a Penrose drain was placed around the esophagus to help with retraction. At this time the phrenoesophageal fat pad was dissected away from the esophagus. There was a 5-6cm hiatal hernia seen. I mobilized the esophagus cephalad approximately 3-4 cm, clearing away the surrounding tissue.   At this time we turned our attention to the greater curvature the stomach and the omentum was mobilized using the robotic vessel sealer. This was taken up to the greater curvature to the hiatus. This mobilized the entire greater curvature to allow mobilization and the wrap. I then proceeded to bring the greater curvature the stomach posterior to the esophagus, and a shoeshine technique was used to evaluate the mobilization of the greater curvature.   At this time I proceeded to close the hiatus using 4 interrupted 0 Ehthibond stitches in  An interrupted fashion. This brought together  the hiatal closure without undue stricture to the esophagus.   A piece of BioA U shaped mesh was placed over the hiatal closure and sutured to the crus using 0 silk sutures x 5. There were at each anterior corners and the posterior crus closure.  At this time the greater curvature was brought around the esophagus and sutured using 0 Ethibond sutures interrupted  fashion approximately 1 cm apart x3. The middle suture was sutured to the esophagus. A left collar stitch was then used to gastropexy the stomach from the wrap to the diaphragm just lateral to the left crus as.  A second collar stitch was placed from the wrap to the right crus. The wrap lay at approximately 11:00 on its own with undue tension.  The wrap lay loose with no strangulation of the esophagus.  At this time the anterior Stomach was pexied to the anterior abdominal wall x 2.  A 19Fr JP blake drain was placed within the mediastinum, and anchored to the anterior abdominal wall with a 2-0 nylon.  At this time the robot was undocked. The liver trocar was removed. At this time insufflation was evacuated. Skin was reapproximated for 4-0 Monocryl subcuticular fashion. The skin was then dressed with Dermabond. The patient tolerated the procedure well and was taken to the recovery room in stable condition.   PLAN OF CARE: Admit for overnight observation  PATIENT DISPOSITION:  PACU - hemodynamically stable.   Delay start of Pharmacological VTE agent (>24hrs) due to surgical blood loss or risk of bleeding: yes

## 2016-01-09 NOTE — Anesthesia Preprocedure Evaluation (Addendum)
Anesthesia Evaluation  Patient identified by MRN, date of birth, ID band Patient awake    Reviewed: Allergy & Precautions, NPO status , Patient's Chart, lab work & pertinent test results  Airway Mallampati: II  TM Distance: >3 FB Neck ROM: Full    Dental no notable dental hx.    Pulmonary neg pulmonary ROS,    Pulmonary exam normal breath sounds clear to auscultation       Cardiovascular negative cardio ROS Normal cardiovascular exam Rhythm:Regular Rate:Normal     Neuro/Psych PSYCHIATRIC DISORDERS Depression negative neurological ROS     GI/Hepatic Neg liver ROS, hiatal hernia, GERD  Medicated,  Endo/Other  Obesity  Renal/GU negative Renal ROS  negative genitourinary   Musculoskeletal negative musculoskeletal ROS (+)   Abdominal   Peds negative pediatric ROS (+)  Hematology negative hematology ROS (+)   Anesthesia Other Findings   Reproductive/Obstetrics negative OB ROS                            Anesthesia Physical Anesthesia Plan  ASA: II  Anesthesia Plan: General   Post-op Pain Management:    Induction: Intravenous  Airway Management Planned: Oral ETT  Additional Equipment:   Intra-op Plan:   Post-operative Plan: Extubation in OR  Informed Consent: I have reviewed the patients History and Physical, chart, labs and discussed the procedure including the risks, benefits and alternatives for the proposed anesthesia with the patient or authorized representative who has indicated his/her understanding and acceptance.   Dental advisory given  Plan Discussed with:   Anesthesia Plan Comments:         Anesthesia Quick Evaluation

## 2016-01-10 ENCOUNTER — Inpatient Hospital Stay (HOSPITAL_COMMUNITY): Payer: Medicare Other

## 2016-01-10 LAB — CBC
HCT: 39.8 % (ref 36.0–46.0)
HEMOGLOBIN: 12 g/dL (ref 12.0–15.0)
MCH: 27 pg (ref 26.0–34.0)
MCHC: 30.2 g/dL (ref 30.0–36.0)
MCV: 89.4 fL (ref 78.0–100.0)
PLATELETS: 233 10*3/uL (ref 150–400)
RBC: 4.45 MIL/uL (ref 3.87–5.11)
RDW: 14.1 % (ref 11.5–15.5)
WBC: 11.8 10*3/uL — AB (ref 4.0–10.5)

## 2016-01-10 LAB — BASIC METABOLIC PANEL
ANION GAP: 5 (ref 5–15)
BUN: 20 mg/dL (ref 6–20)
CALCIUM: 8.4 mg/dL — AB (ref 8.9–10.3)
CO2: 27 mmol/L (ref 22–32)
CREATININE: 0.84 mg/dL (ref 0.44–1.00)
Chloride: 108 mmol/L (ref 101–111)
Glucose, Bld: 167 mg/dL — ABNORMAL HIGH (ref 65–99)
Potassium: 4.4 mmol/L (ref 3.5–5.1)
SODIUM: 140 mmol/L (ref 135–145)

## 2016-01-10 LAB — MRSA PCR SCREENING: MRSA BY PCR: NEGATIVE

## 2016-01-10 LAB — TROPONIN I

## 2016-01-10 MED ORDER — SODIUM CHLORIDE 0.9 % IV SOLN
Freq: Once | INTRAVENOUS | Status: AC
  Administered 2016-01-10: 500 mL via INTRAVENOUS

## 2016-01-10 MED ORDER — IOPAMIDOL (ISOVUE-300) INJECTION 61%
30.0000 mL | Freq: Once | INTRAVENOUS | Status: AC | PRN
  Administered 2016-01-10: 15 mL via ORAL

## 2016-01-10 MED ORDER — IOPAMIDOL (ISOVUE-370) INJECTION 76%
100.0000 mL | Freq: Once | INTRAVENOUS | Status: AC | PRN
Start: 2016-01-10 — End: 2016-01-10
  Administered 2016-01-10: 100 mL via INTRAVENOUS

## 2016-01-10 MED ORDER — METHOCARBAMOL 1000 MG/10ML IJ SOLN
500.0000 mg | Freq: Four times a day (QID) | INTRAVENOUS | Status: AC
  Administered 2016-01-10 – 2016-01-11 (×4): 500 mg via INTRAVENOUS
  Filled 2016-01-10: qty 550
  Filled 2016-01-10: qty 5
  Filled 2016-01-10 (×3): qty 550

## 2016-01-10 NOTE — Progress Notes (Signed)
Arrived to xray with bed and O2 tank. Accompanied by NT and pt's daughter. Pt lying flat on xray table, clammy with response to touch only. O2 sat on RA 56%. O2 @ 4L applied via O2 tank and head of xray table elevated. Head supported. Verbally and tactically aroused with quick return of sats into upper 80's low 90's. Verbally encouraged deep breathing  w/demonstrated understanding. Dr. Abbey Chattersosenbower near  radiology and in to see pt. Reassurance given to pt and daughter. MD revealed results of UGI and advised her on how to proceed with clears.

## 2016-01-10 NOTE — Progress Notes (Addendum)
1 Day Post-Op  Subjective: She c/o severe sharp pain when breathing in her left lower chest and LUQ.  Also has pain in left shoulder blade.  Objective: Vital signs in last 24 hours: Temp:  [97.7 F (36.5 C)-98.9 F (37.2 C)] 98.7 F (37.1 C) (09/23 0530) Pulse Rate:  [86-96] 90 (09/23 0530) Resp:  [16-22] 16 (09/23 0530) BP: (91-173)/(58-95) 91/59 (09/23 0530) SpO2:  [91 %-98 %] 97 % (09/23 0530)    Intake/Output from previous day: 09/22 0701 - 09/23 0700 In: 2986.7 [I.V.:2536.7; IV Piggyback:450] Out: 1330 [Urine:1110; Drains:170; Blood:50] Intake/Output this shift: No intake/output data recorded.  PE: General- Uncomfortable.  Awake and alert Lungs-shallow, equal breath sounds Abdomen-soft, serosanguinous drain output, incisions clean and intact, LUQ tender  Lab Results:  No results for input(s): WBC, HGB, HCT, PLT in the last 72 hours. BMET  Recent Labs  01/10/16 0444  NA 140  K 4.4  CL 108  CO2 27  GLUCOSE 167*  BUN 20  CREATININE 0.84  CALCIUM 8.4*   PT/INR No results for input(s): LABPROT, INR in the last 72 hours. Comprehensive Metabolic Panel:    Component Value Date/Time   NA 140 01/10/2016 0444   NA 142 01/06/2016 1450   K 4.4 01/10/2016 0444   K 4.9 01/06/2016 1450   CL 108 01/10/2016 0444   CL 109 01/06/2016 1450   CO2 27 01/10/2016 0444   CO2 29 01/06/2016 1450   BUN 20 01/10/2016 0444   BUN 14 01/06/2016 1450   CREATININE 0.84 01/10/2016 0444   CREATININE 0.91 01/06/2016 1450   GLUCOSE 167 (H) 01/10/2016 0444   GLUCOSE 120 (H) 01/06/2016 1450   CALCIUM 8.4 (L) 01/10/2016 0444   CALCIUM 9.4 01/06/2016 1450     Studies/Results: No results found.  Anti-infectives: Anti-infectives    Start     Dose/Rate Route Frequency Ordered Stop   01/09/16 1012  ceFAZolin (ANCEF) IVPB 2g/100 mL premix     2 g 200 mL/hr over 30 Minutes Intravenous On call to O.R. 01/09/16 1012 01/09/16 1214      Assessment Active Problems:   S/P Nissen  fundoplication (with gastrostomy tube placement) (HCC)-having significant pain this AM, no fever or tachycardia; esophagram pending    LOS: 1 day   Plan: Add Robaxin.  Keep NPO.  Check esophagram.   Becki Mccaskill J 01/10/2016

## 2016-01-10 NOTE — Progress Notes (Signed)
Esophagram reviewed with Radiologist-> no leak.  Her pain is better.  Spoke with her daughter.  Will start clear liquids.

## 2016-01-10 NOTE — Progress Notes (Signed)
Transferred via bed to [facility] bed 1239. Accompanied by nurse and NT. Updated report given to Stevphen MeuseMary Gilliam, RN upon arrival.

## 2016-01-10 NOTE — Progress Notes (Signed)
Dr. Abbey Chattersosenbower notified that pt has severe pressure and pain in lt flank. Md also aware increased output in JP drain over last 2 hrs upward of 300 ml of thin, bloody drainage. MD ordered CXR and CBC and report he would be in to see pt soon. Sats currently low 90's with HR 90-100's. Reassurance given to pt and daughter/family.

## 2016-01-10 NOTE — Progress Notes (Signed)
Called to see patient because of severe left sided chest pain and increased drain output.  Pain has been present for about 2 hours now.  PCXR demonstrated a left pneumothorax 17 mm from chest wall.  Will check EKG, CBC, Troponin level, CT of chest and abdomen.  Will transfer to SDU.  Discussed with her and family.

## 2016-01-10 NOTE — Progress Notes (Signed)
In stepdown now and is much more comfortable. Hemoglobin relatively stable and white count minimally elevated.  EKG appears normal. CT of the chest demonstrates a 10 % left pneumothorax and significant bibasilar atelectasis, no pulmonary embolism. There is bilateral subcutaneous air in the chest walls. No evidence of visceral perforation in the mediastinum. CT of the abdomen demonstrates no evidence of organ injury. I suspect most of her pain is from severe pleurisy and should better with time. We'll need to work on some incentive spirometry for her severe atelectasis. We'll repeat chest x-ray in the morning. This was all discussed with her family.

## 2016-01-10 NOTE — Progress Notes (Addendum)
Xray technician called unit to report pt not talking to radiological staff and having difficulty getting pt back to wc. Amy, xray tech, asked nurse to bring pt's bed.

## 2016-01-10 NOTE — Progress Notes (Signed)
With assist of NT, pt transported to CT with O2 at 40% via venturi mask. I stayed with pt and monitored pt w/ continuous pulse ox throughout procedure. Lowest Sats noted 86-87% while CT in progress.Verbally encouraged slow deep breathing and reassurance given. No distress noted. After CT completed, pt back to bed with sats up to upper 90's.

## 2016-01-10 NOTE — Progress Notes (Addendum)
Report given to Stevphen MeuseMary Gilliam, RN regarding pt status and reason for transfer. Dr. Abbey Chattersosenbower recently in to see and review recent portable chest X-ray results. CBC done and EKG in progress. VSS w/sats at 92% with intermittent drops accompanying pain. O2 at 40% via venturi mask. Pt to travel to CT accompanied by primary nurse then to [facility] stepdown bed 1239 per order.

## 2016-01-10 NOTE — Progress Notes (Signed)
Resting quietly with eyes closed. No distress. O2 sats on 4l via [place] 91-92%. Pt in upright position supported by pillows. Daughter at bedside and attentive.

## 2016-01-10 NOTE — Progress Notes (Signed)
Pt breathing easy with sats @ 92% on 4L via [place], O2 tank in use. BP 120/70, HR 92 and respirations 16. Remains sweaty yet resting more comfortably. Moved to personal bed for transport back to unit.

## 2016-01-11 ENCOUNTER — Inpatient Hospital Stay (HOSPITAL_COMMUNITY): Payer: Medicare Other

## 2016-01-11 DIAGNOSIS — R0902 Hypoxemia: Secondary | ICD-10-CM

## 2016-01-11 DIAGNOSIS — J939 Pneumothorax, unspecified: Secondary | ICD-10-CM

## 2016-01-11 LAB — CBC
HEMATOCRIT: 36.3 % (ref 36.0–46.0)
Hemoglobin: 10.9 g/dL — ABNORMAL LOW (ref 12.0–15.0)
MCH: 27.2 pg (ref 26.0–34.0)
MCHC: 30 g/dL (ref 30.0–36.0)
MCV: 90.5 fL (ref 78.0–100.0)
PLATELETS: 189 10*3/uL (ref 150–400)
RBC: 4.01 MIL/uL (ref 3.87–5.11)
RDW: 14.5 % (ref 11.5–15.5)
WBC: 8.1 10*3/uL (ref 4.0–10.5)

## 2016-01-11 LAB — BASIC METABOLIC PANEL
ANION GAP: 5 (ref 5–15)
BUN: 23 mg/dL — ABNORMAL HIGH (ref 6–20)
CO2: 27 mmol/L (ref 22–32)
CREATININE: 0.93 mg/dL (ref 0.44–1.00)
Calcium: 8.6 mg/dL — ABNORMAL LOW (ref 8.9–10.3)
Chloride: 109 mmol/L (ref 101–111)
GFR, EST NON AFRICAN AMERICAN: 60 mL/min — AB (ref >60)
GLUCOSE: 161 mg/dL — AB (ref 65–99)
Potassium: 4.6 mmol/L (ref 3.5–5.1)
Sodium: 141 mmol/L (ref 135–145)

## 2016-01-11 LAB — TROPONIN I: Troponin I: <0.03 ng/mL (ref <0.03)

## 2016-01-11 LAB — AMYLASE: AMYLASE: 21 U/L — AB (ref 28–100)

## 2016-01-11 LAB — LIPASE, BLOOD: Lipase: 19 U/L (ref 11–51)

## 2016-01-11 MED ORDER — PIPERACILLIN-TAZOBACTAM 3.375 G IVPB
3.3750 g | Freq: Three times a day (TID) | INTRAVENOUS | Status: AC
  Administered 2016-01-11 – 2016-01-16 (×15): 3.375 g via INTRAVENOUS
  Filled 2016-01-11 (×13): qty 50

## 2016-01-11 MED ORDER — LORAZEPAM 2 MG/ML IJ SOLN: 0.5000 mg | INTRAMUSCULAR | Status: DC | PRN

## 2016-01-11 MED ORDER — METHOCARBAMOL 1000 MG/10ML IJ SOLN
1000.0000 mg | Freq: Four times a day (QID) | INTRAVENOUS | Status: DC | PRN
  Filled 2016-01-11: qty 10

## 2016-01-11 MED ORDER — MENTHOL 3 MG MT LOZG: 1.0000 | LOZENGE | OROMUCOSAL | Status: DC | PRN

## 2016-01-11 MED ORDER — FOLIC ACID 1 MG PO TABS
1.0000 mg | ORAL_TABLET | Freq: Every day | ORAL | Status: DC
  Administered 2016-01-12 – 2016-01-15 (×4): 1 mg via ORAL
  Filled 2016-01-11 (×5): qty 1

## 2016-01-11 MED ORDER — OXYCODONE HCL 20 MG/ML PO CONC
5.0000 mg | ORAL | Status: DC | PRN
  Administered 2016-01-12: 10 mg via ORAL
  Filled 2016-01-11: qty 1

## 2016-01-11 MED ORDER — FUROSEMIDE 10 MG/ML IJ SOLN
40.0000 mg | Freq: Once | INTRAMUSCULAR | Status: AC
  Filled 2016-01-11: qty 4

## 2016-01-11 MED ORDER — VITAMIN D3 25 MCG (1000 UNIT) PO TABS
2000.0000 [IU] | ORAL_TABLET | Freq: Every day | ORAL | Status: DC
  Administered 2016-01-11 – 2016-01-15 (×5): 2000 [IU] via ORAL
  Filled 2016-01-11 (×6): qty 2

## 2016-01-11 MED ORDER — BISACODYL 10 MG RE SUPP
10.0000 mg | Freq: Every day | RECTAL | Status: DC
  Administered 2016-01-11 – 2016-01-15 (×5): 10 mg via RECTAL
  Filled 2016-01-11 (×6): qty 1

## 2016-01-11 MED ORDER — METHOCARBAMOL 500 MG PO TABS: 1000.0000 mg | ORAL_TABLET | Freq: Four times a day (QID) | ORAL | Status: DC | PRN

## 2016-01-11 MED ORDER — ADULT MULTIVITAMIN W/MINERALS CH
1.0000 | ORAL_TABLET | Freq: Every day | ORAL | Status: DC
  Administered 2016-01-12 – 2016-01-15 (×4): 1 via ORAL
  Filled 2016-01-11 (×7): qty 1

## 2016-01-11 MED ORDER — ACETAMINOPHEN 500 MG PO TABS: 1000.0000 mg | ORAL_TABLET | Freq: Four times a day (QID) | ORAL | Status: DC

## 2016-01-11 MED ORDER — ZOLPIDEM TARTRATE 5 MG PO TABS: 5.0000 mg | ORAL_TABLET | Freq: Every evening | ORAL | Status: DC | PRN

## 2016-01-11 MED ORDER — METHOCARBAMOL 1000 MG/10ML IJ SOLN
1000.0000 mg | Freq: Three times a day (TID) | INTRAVENOUS | Status: AC
  Administered 2016-01-11 (×2): 1000 mg via INTRAVENOUS
  Filled 2016-01-11 (×2): qty 10

## 2016-01-11 MED ORDER — VITAMIN B-1 100 MG PO TABS
100.0000 mg | ORAL_TABLET | Freq: Every day | ORAL | Status: DC
  Administered 2016-01-12 – 2016-01-15 (×4): 100 mg via ORAL
  Filled 2016-01-11 (×5): qty 1

## 2016-01-11 MED ORDER — DIPHENHYDRAMINE HCL 50 MG/ML IJ SOLN: 12.5000 mg | Freq: Four times a day (QID) | INTRAMUSCULAR | Status: DC | PRN

## 2016-01-11 MED ORDER — FENTANYL 40 MCG/ML IV SOLN: INTRAVENOUS | Status: DC

## 2016-01-11 MED ORDER — ALUM & MAG HYDROXIDE-SIMETH 200-200-20 MG/5ML PO SUSP: 30.0000 mL | Freq: Four times a day (QID) | ORAL | Status: DC | PRN

## 2016-01-11 MED ORDER — ALBUTEROL SULFATE (2.5 MG/3ML) 0.083% IN NEBU: 2.5000 mg | INHALATION_SOLUTION | Freq: Four times a day (QID) | RESPIRATORY_TRACT | Status: DC | PRN

## 2016-01-11 MED ORDER — GUAIFENESIN-DM 100-10 MG/5ML PO SYRP: 15.0000 mL | ORAL_SOLUTION | ORAL | Status: DC | PRN

## 2016-01-11 MED ORDER — LACTATED RINGERS IV BOLUS (SEPSIS): 1000.0000 mL | Freq: Three times a day (TID) | INTRAVENOUS | Status: DC | PRN

## 2016-01-11 MED ORDER — VITAMIN C 500 MG PO TABS
500.0000 mg | ORAL_TABLET | Freq: Two times a day (BID) | ORAL | Status: DC
  Administered 2016-01-11 – 2016-01-15 (×9): 500 mg via ORAL
  Filled 2016-01-11 (×10): qty 1

## 2016-01-11 MED ORDER — SIMETHICONE 80 MG PO CHEW
80.0000 mg | CHEWABLE_TABLET | Freq: Four times a day (QID) | ORAL | Status: DC
  Administered 2016-01-11 (×3): 80 mg via ORAL
  Filled 2016-01-11 (×3): qty 1

## 2016-01-11 MED ORDER — ACETAMINOPHEN 500 MG PO TABS: 1000.0000 mg | ORAL_TABLET | Freq: Three times a day (TID) | ORAL | Status: DC

## 2016-01-11 MED ORDER — VITAMIN B-12 2500 MCG SL SUBL: 1.0000 | SUBLINGUAL_TABLET | Freq: Every day | SUBLINGUAL | Status: DC

## 2016-01-11 MED ORDER — BUPROPION HCL ER (XL) 300 MG PO TB24
300.0000 mg | ORAL_TABLET | Freq: Every day | ORAL | Status: DC
  Administered 2016-01-11 – 2016-01-16 (×6): 300 mg via ORAL
  Filled 2016-01-11 (×6): qty 1

## 2016-01-11 MED ORDER — SODIUM CHLORIDE 0.9% FLUSH: 9.0000 mL | INTRAVENOUS | Status: DC | PRN

## 2016-01-11 MED ORDER — METHOCARBAMOL 1000 MG/10ML IJ SOLN
1000.0000 mg | Freq: Four times a day (QID) | INTRAMUSCULAR | Status: DC | PRN
  Filled 2016-01-11: qty 10

## 2016-01-11 MED ORDER — LIP MEDEX EX OINT
1.0000 "application " | TOPICAL_OINTMENT | Freq: Two times a day (BID) | CUTANEOUS | Status: DC
  Administered 2016-01-11 – 2016-01-16 (×11): 1 via TOPICAL
  Filled 2016-01-11 (×3): qty 7

## 2016-01-11 MED ORDER — ASPIRIN EC 81 MG PO TBEC
81.0000 mg | DELAYED_RELEASE_TABLET | Freq: Every day | ORAL | Status: DC
  Administered 2016-01-11 – 2016-01-12 (×2): 81 mg via ORAL
  Filled 2016-01-11 (×3): qty 1

## 2016-01-11 MED ORDER — METOPROLOL TARTRATE 5 MG/5ML IV SOLN: 5.0000 mg | Freq: Four times a day (QID) | INTRAVENOUS | Status: DC | PRN

## 2016-01-11 MED ORDER — LORAZEPAM 1 MG PO TABS: 1.0000 mg | ORAL_TABLET | Freq: Four times a day (QID) | ORAL | Status: AC | PRN

## 2016-01-11 MED ORDER — NALOXONE HCL 0.4 MG/ML IJ SOLN: 0.4000 mg | INTRAMUSCULAR | Status: DC | PRN

## 2016-01-11 MED ORDER — CITALOPRAM HYDROBROMIDE 20 MG PO TABS
40.0000 mg | ORAL_TABLET | Freq: Every day | ORAL | Status: DC
  Administered 2016-01-11 – 2016-01-16 (×6): 40 mg via ORAL
  Filled 2016-01-11 (×7): qty 2

## 2016-01-11 MED ORDER — HYDROCODONE-ACETAMINOPHEN 7.5-325 MG/15ML PO SOLN
10.0000 mL | ORAL | Status: DC | PRN
  Administered 2016-01-11 (×3): 10 mL via ORAL
  Filled 2016-01-11 (×3): qty 15

## 2016-01-11 MED ORDER — HYDROMORPHONE HCL 1 MG/ML IJ SOLN: 1.0000 mg | INTRAMUSCULAR | Status: DC | PRN

## 2016-01-11 MED ORDER — MAGIC MOUTHWASH
15.0000 mL | Freq: Four times a day (QID) | ORAL | Status: DC | PRN
  Filled 2016-01-11: qty 15

## 2016-01-11 MED ORDER — DIPHENHYDRAMINE HCL 12.5 MG/5ML PO ELIX: 12.5000 mg | ORAL_SOLUTION | Freq: Four times a day (QID) | ORAL | Status: DC | PRN

## 2016-01-11 MED ORDER — PHENOL 1.4 % MT LIQD: 2.0000 | OROMUCOSAL | Status: DC | PRN

## 2016-01-11 MED ORDER — PROCHLORPERAZINE EDISYLATE 5 MG/ML IJ SOLN: 5.0000 mg | INTRAMUSCULAR | Status: DC | PRN

## 2016-01-11 MED ORDER — ACETAMINOPHEN 500 MG PO TABS
500.0000 mg | ORAL_TABLET | Freq: Three times a day (TID) | ORAL | Status: DC
  Administered 2016-01-11 (×2): 500 mg via ORAL
  Filled 2016-01-11 (×3): qty 1

## 2016-01-11 MED ORDER — FENTANYL CITRATE (PF) 100 MCG/2ML IJ SOLN
25.0000 ug | INTRAMUSCULAR | Status: DC | PRN
  Administered 2016-01-11 – 2016-01-12 (×10): 100 ug via INTRAVENOUS
  Filled 2016-01-11 (×10): qty 2

## 2016-01-11 MED ORDER — DEXAMETHASONE SODIUM PHOSPHATE 4 MG/ML IJ SOLN
4.0000 mg | Freq: Two times a day (BID) | INTRAMUSCULAR | Status: AC
  Administered 2016-01-11 – 2016-01-13 (×6): 4 mg via INTRAVENOUS
  Filled 2016-01-11 (×6): qty 1

## 2016-01-11 MED ORDER — VITAMIN B-12 1000 MCG PO TABS
2500.0000 ug | ORAL_TABLET | Freq: Every day | ORAL | Status: DC
  Administered 2016-01-11 – 2016-01-16 (×6): 2500 ug via ORAL
  Filled 2016-01-11 (×6): qty 3

## 2016-01-11 MED ORDER — ZOLPIDEM TARTRATE 5 MG PO TABS
5.0000 mg | ORAL_TABLET | Freq: Every evening | ORAL | Status: DC | PRN
Start: 2016-01-11 — End: 2016-01-11

## 2016-01-11 MED ORDER — SODIUM CHLORIDE 0.9 % IV SOLN
INTRAVENOUS | Status: DC
  Administered 2016-01-11: 500 mL via INTRAVENOUS

## 2016-01-11 MED ORDER — ENOXAPARIN SODIUM 40 MG/0.4ML ~~LOC~~ SOLN
40.0000 mg | Freq: Every day | SUBCUTANEOUS | Status: DC
  Administered 2016-01-11 – 2016-01-16 (×6): 40 mg via SUBCUTANEOUS
  Filled 2016-01-11 (×6): qty 0.4

## 2016-01-11 MED ORDER — METHOCARBAMOL 1000 MG/10ML IJ SOLN
1000.0000 mg | Freq: Three times a day (TID) | INTRAVENOUS | Status: DC
  Filled 2016-01-11: qty 10

## 2016-01-11 MED ORDER — LORAZEPAM 2 MG/ML IJ SOLN
1.0000 mg | Freq: Four times a day (QID) | INTRAMUSCULAR | Status: AC | PRN
  Administered 2016-01-11 – 2016-01-13 (×3): 1 mg via INTRAVENOUS
  Filled 2016-01-11 (×3): qty 1

## 2016-01-11 MED ORDER — ACETAMINOPHEN 500 MG PO TABS: 500.0000 mg | ORAL_TABLET | Freq: Four times a day (QID) | ORAL | Status: DC

## 2016-01-11 MED ORDER — POLYETHYLENE GLYCOL 3350 17 G PO PACK
17.0000 g | PACK | Freq: Every day | ORAL | Status: DC
  Administered 2016-01-11 – 2016-01-16 (×6): 17 g via ORAL
  Filled 2016-01-11 (×6): qty 1

## 2016-01-11 MED ORDER — THIAMINE HCL 100 MG/ML IJ SOLN
100.0000 mg | Freq: Every day | INTRAMUSCULAR | Status: DC
  Filled 2016-01-11 (×2): qty 2

## 2016-01-11 MED ORDER — LACTATED RINGERS IV BOLUS (SEPSIS): 1000.0000 mL | Freq: Three times a day (TID) | INTRAVENOUS | Status: AC | PRN

## 2016-01-11 NOTE — Progress Notes (Signed)
Pharmacy Antibiotic Note  Judy Weiss is a 70 y.o. female admitted on 01/09/2016 with possible aspiration PNA.  Pharmacy has been consulted for Zosyn dosing.  Plan: Zosyn 3.375g IV q8h (4 hour infusion).  Monitor renal function and cx data  Noted MD plan for 72h of antibiotics  Height: 5\' 6"  (167.6 cm) Weight: 208 lb 12.4 oz (94.7 kg) IBW/kg (Calculated) : 59.3  Temp (24hrs), Avg:98.2 F (36.8 C), Min:97 F (36.1 C), Max:98.9 F (37.2 C)   Recent Labs Lab 01/06/16 1450 01/10/16 0444 01/10/16 1501 01/11/16 0509  WBC 4.9  --  11.8* 8.1  CREATININE 0.91 0.84  --  0.93    Estimated Creatinine Clearance: 63.4 mL/min (by C-G formula based on SCr of 0.93 mg/dL).    No Known Allergies  Antimicrobials this admission: 9/24 Zosyn >>   Dose adjustments this admission:  Microbiology results: 9/23 MRSA PCR: negative  Thank you for allowing pharmacy to be a part of this patient's care.  Junita PushMichelle Antinio Sanderfer, PharmD, BCPS Pager: [phone] 01/11/2016 8:53 AM

## 2016-01-11 NOTE — Progress Notes (Addendum)
Judy Weiss  April 11, 1946 [id number]  Patient Care Team: Sandford Craze, NP as PCP - General (Internal Medicine)  Patient intermittently anxious and confused.  Usually following commands.  Was able to get in to chair.  Pulled mask off.  Likes IV narcotics.  Family concern that she may be getting confused/hallucinating.  Will switch to PO oxycodone orally.  Nurses feel patient would not be able to follow PCA.  We will continue when necessary fentanyl for now.  Tylenol/Robaxin around-the-clock.  Uses naproxen at home bvut wait & try in AM if Cr OK.  She did diurese a liter after Lasix.  See if that helps improve things.  Vital signs otherwise okay.  Serum amylase normal.  Drain amylase not back yet.  I asked the nurse make sure that the dayshift scented properlyand that the lab is going to evaluate it.  Apparently she does drink some hard cider at night.  We will do CIWA alcohol withdrawal protocol since hjer symptoms are not pain is much as anxiety, confusion, and agitation.  Follow-up chest x-ray.  Follow exam closely    Patient Active Problem List   Diagnosis Date Noted  . Hypoxia 01/11/2016  . Pneumothorax, left 01/11/2016  . 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 Depression 05/02/2015  . GERD (gastroesophageal reflux disease) s/p Nissen fundoplication 01/09/2016 05/02/2015  . History of DVT (deep vein thrombosis) 05/02/2015  . Lichen sclerosus 05/02/2015  . Hyperlipidemia 05/02/2015    Past Medical History:  Diagnosis Date  . Allergy    food allergies and some additives. But she does not know what will cause.  . Depression   . GERD (gastroesophageal reflux disease)    had in past but now controlled. Has hiatal hernia.  Marland Kitchen Heart murmur   . History of blood clots 1996?   left leg  . History of colon polyps   . History of hiatal hernia   . Hyperlipidemia     Past Surgical History:   Procedure Laterality Date  . APPENDECTOMY  1972  . DILATION AND CURETTAGE OF UTERUS    . KNEE SURGERY Bilateral    hx of torn meniscus (arthroscopic surgery)  . LEG SURGERY Left 2001   Had veins stripped   . SHOULDER SURGERY Right    reports hx arthroscopic surgery    Social History   Social History  . Marital status: Divorced    Spouse name: N/A  . Number of children: N/A  . Years of education: N/A   Occupational History  . Not on file.   Social History Main Topics  . Smoking status: Never Smoker  . Smokeless tobacco: Never Used  . Alcohol use 4.2 oz/week    7 Standard drinks or equivalent per week     Comment: pt  drinks one glass of hard cidar at dinner each day  . Drug use: No  . Sexual activity: No   Other Topics Concern  . 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   Divorced   4 cats and a dog   Enjoys antiquing.  Buys and sells.  Enjoys shopping    Family History  Problem Relation Age of Onset  .  Diabetes Mother   . Hypothyroidism Mother     Current Facility-Administered Medications  Medication Dose Route Frequency Provider Last Rate Last Dose  . 0.9 %  sodium chloride infusion   Intravenous Continuous Karie Soda, MD 10 mL/hr at 01/11/16 2000    . [START ON 01/12/2016] acetaminophen (TYLENOL) tablet 1,000 mg  1,000 mg Oral QID Karie Soda, MD      . albuterol (PROVENTIL) (2.5 MG/3ML) 0.083% nebulizer solution 2.5 mg  2.5 mg Nebulization Q6H PRN Karie Soda, MD      . alum & mag hydroxide-simeth (MAALOX/MYLANTA) 200-200-20 MG/5ML suspension 30 mL  30 mL Oral Q6H PRN Karie Soda, MD      . aspirin EC tablet 81 mg  81 mg Oral Daily Karie Soda, MD   81 mg at 01/11/16 1418  . bisacodyl (DULCOLAX) suppository 10 mg  10 mg Rectal Daily Karie Soda, MD   10 mg at 01/11/16 1422  . buPROPion (WELLBUTRIN XL) 24 hr tablet 300 mg  300 mg Oral Daily Karie Soda, MD   300 mg at 01/11/16 1414  . cholecalciferol (VITAMIN D) tablet 2,000 Units  2,000 Units Oral Daily Karie Soda, MD   2,000 Units at 01/11/16 1714  . citalopram (CELEXA) tablet 40 mg  40 mg Oral Daily Karie Soda, MD   40 mg at 01/11/16 1411  . dexamethasone (DECADRON) injection 4 mg  4 mg Intravenous Q12H Karie Soda, MD   4 mg at 01/11/16 2118  . enoxaparin (LOVENOX) injection 40 mg  40 mg Subcutaneous Daily Karie Soda, MD   40 mg at 01/11/16 1027  . fentaNYL (SUBLIMAZE) injection 25-100 mcg  25-100 mcg Intravenous Q1H PRN Karie Soda, MD   100 mcg at 01/11/16 2240  . [START ON 01/12/2016] folic acid (FOLVITE) tablet 1 mg  1 mg Oral Daily Karie Soda, MD      . guaiFENesin-dextromethorphan (ROBITUSSIN DM) 100-10 MG/5ML syrup 15 mL  15 mL Oral Q4H PRN Karie Soda, MD      . lactated ringers bolus 1,000 mL  1,000 mL Intravenous Q8H PRN Karie Soda, MD      . lip balm (CARMEX) ointment 1 application  1 application Topical BID Karie Soda, MD   1 application at 01/11/16 2200  . LORazepam (ATIVAN) tablet 1 mg  1 mg Oral Q6H PRN Karie Soda, MD       Or  . LORazepam (ATIVAN) injection 1 mg  1 mg Intravenous Q6H PRN Karie Soda, MD      . magic mouthwash  15 mL Oral QID PRN Karie Soda, MD      . menthol-cetylpyridinium (CEPACOL) lozenge 3 mg  1 lozenge Oral PRN Karie Soda, MD      . Melene Muller ON 01/12/2016] methocarbamol (ROBAXIN) 1,000 mg in dextrose 5 % 50 mL IVPB  1,000 mg Intravenous Q6H PRN Karie Soda, MD      . metoprolol (LOPRESSOR) injection 5 mg  5 mg Intravenous Q6H PRN Karie Soda, MD      . Melene Muller ON 01/12/2016] multivitamin with minerals tablet 1 tablet  1 tablet Oral Daily Karie Soda, MD      . ondansetron (ZOFRAN-ODT) disintegrating tablet 4 mg  4 mg Oral Q6H PRN Axel Filler, MD       Or  . ondansetron [facility]) injection 4 mg  4 mg Intravenous Q6H  PRN Axel Filler, MD      . oxyCODONE (ROXICODONE INTENSOL) 20 MG/ML concentrated solution 5-10 mg  5-10 mg Oral Q3H  PRN Karie Soda, MD      . phenol [facility]) mouth spray 2 spray  2 spray Mouth/Throat PRN Karie Soda, MD      . piperacillin-tazobactam (ZOSYN) IVPB 3.375 g  3.375 g Intravenous Q8H Loleta Rose Lilliston, RPH   3.375 g at 01/11/16 1729  . polyethylene glycol (MIRALAX / GLYCOLAX) packet 17 g  17 g Oral Daily Karie Soda, MD   17 g at 01/11/16 1716  . prochlorperazine (COMPAZINE) injection 5-10 mg  5-10 mg Intravenous Q4H PRN Karie Soda, MD      . simethicone [facility]) chewable tablet 80 mg  80 mg Oral QID Karie Soda, MD   80 mg at 01/11/16 2118  . [START ON 01/12/2016] thiamine (VITAMIN B-1) tablet 100 mg  100 mg Oral Daily Karie Soda, MD       Or  . Melene Muller ON 01/12/2016] thiamine (B-1) injection 100 mg  100 mg Intravenous Daily Karie Soda, MD      . vitamin B-12 (CYANOCOBALAMIN) tablet 2,500 mcg  2,500 mcg Oral Daily Karie Soda, MD   2,500 mcg at 01/11/16 1714  . vitamin C (ASCORBIC ACID) tablet 500 mg  500 mg Oral BID Karie Soda, MD   500 mg at 01/11/16 1715  . zolpidem (AMBIEN) tablet 5 mg  5 mg Oral QHS PRN Karie Soda, MD         No Known Allergies  BP 101/85   Pulse 95   Temp 99.4 F (37.4 C) (Oral)   Resp (!) 22   Ht 5\' 6"  (1.676 m)   Wt 94.7 kg (208 lb 12.4 oz)   SpO2 98%   BMI 33.70 kg/m   Ct Angio Chest Pe W Or Wo Contrast  Result Date: 01/10/2016 CLINICAL DATA:  Inpatient. Hypoxia. Left pneumothorax. Status post robotic hiatal hernia repair with Nissen fundoplication 1 day prior. EXAM: CT ANGIOGRAPHY CHEST CT ABDOMEN WITH CONTRAST TECHNIQUE: Multidetector CT imaging of the chest was performed using the standard protocol during bolus administration of intravenous contrast. Multiplanar CT image reconstructions and MIPs were obtained to evaluate the vascular anatomy. Multidetector CT imaging of the abdomen was performed using the standard  protocol during bolus administration of intravenous contrast. CONTRAST:  100 cc Isovue 370 IV. COMPARISON:  09/10/2015 chest CT angiogram. Esophagram from earlier today. FINDINGS: CTA CHEST FINDINGS Cardiovascular: The study is moderate quality for the evaluation of pulmonary embolism, limited by motion artifact and limited contrast opacification, which limits evaluation of the subsegmental pulmonary arteries. There are no filling defects in the central, lobar, segmental or subsegmental pulmonary artery branches to suggest acute pulmonary embolism. Mildly atherosclerotic tortuous nonaneurysmal thoracic aorta. Top-normal caliber main pulmonary artery (3.1 cm diameter). Normal heart size. No significant pericardial fluid/thickening. Mediastinum/Nodes: There is minimal scattered pneumomediastinum in the anterior mid to lower mediastinum. There is a small amount of pneumomediastinum abutting the lower third of the thoracic esophagus, with no mediastinal fluid collections and no extraluminal oral contrast. No discrete thyroid nodules. Mildly patulous thoracic esophagus with oral contrast layering in the lower thoracic esophagus, with no appreciable esophageal wall thickening or pneumatosis. No pathologically enlarged axillary, mediastinal or hilar lymph nodes. Lungs/Pleura: Small anterior/apical left pneumothorax (approximately 10%). No mediastinal shift. No right pneumothorax. No pleural effusion. There is moderate to severe atelectasis in the lingula, bilateral lower lobes and dependent and central right upper lobe. No lung masses or significant pulmonary nodules in the aerated portions of the lungs. Mosaic attenuation throughout both lungs. Musculoskeletal: No aggressive appearing focal osseous  lesions. Moderate long segment dextrocurvature of the thoracic spine with associated moderate spondylosis. Large amount of subcutaneous emphysema throughout the left anterior lower neck and entire left chest wall. Tiny amount  of subcutaneous emphysema in the deep lower right chest wall. Review of the MIP images confirms the above findings. CT ABDOMEN FINDINGS Hepatobiliary: Normal liver with no liver mass. Normal gallbladder with no radiopaque cholelithiasis. No biliary ductal dilatation. Pancreas: Normal, with no mass or duct dilation. Spleen: Normal size. No mass. Adrenals/Urinary Tract: Normal adrenals. No hydronephrosis. No renal mass. Symmetrically delayed contrast nephrograms on the delayed sequence. Stomach/Bowel: There are expected postsurgical changes status post Nissen fundoplication in the very proximal stomach with associated mild wall thickening at the fundoplication site. Otherwise no significant gastric distention, gastric wall thickening or pneumatosis. Visualized small and large bowel is normal caliber, with no bowel wall thickening. Vascular/Lymphatic: Atherosclerotic nonaneurysmal abdominal aorta. Patent portal, splenic, hepatic and renal veins. No pathologically enlarged lymph nodes in the abdomen. Other: Minimal pneumoperitoneum in the anterior upper peritoneal cavity bilaterally. No abdominal peritoneal fluid collections. No abdominal ascites. Percutaneous surgical drain terminates under the left hemidiaphragm. Musculoskeletal: No aggressive appearing focal osseous lesions. Long segment prominent levocurvature of the lumbar spine with associated moderate lumbar spondylosis. Extensive subcutaneous emphysema throughout the bilateral abdominal wall. No superficial fluid collections. Review of the MIP images confirms the above findings. IMPRESSION: 1. No evidence of pulmonary embolism, with limitations as described. 2. Small volume pneumomediastinum, probably due to expected postsurgical change. No extraluminal oral contrast or mediastinal fluid collections to suggest an esophageal leak. No esophageal wall thickening or pneumatosis. 3. Moderate to severe bilateral atelectasis as described. 4. Small 10% left  pneumothorax, stable from chest radiograph from earlier today. No mediastinal shift. 5. Expected postsurgical changes in the proximal stomach on postoperative day 1 status post Nissen fundoplication. Minimal pneumoperitoneum in the anterior upper peritoneal cavity is within expected recent postoperative limits. No abdominal fluid collections. No evidence of bowel obstruction or acute visceral abdominal injury. 6. Extensive subcutaneous emphysema in the bilateral abdominal and lower chest wall. 7. Long segment moderate to severe S shaped thoracolumbar scoliosis. 8. Aortic atherosclerosis. These results were called by telephone at the time of interpretation on 01/10/2016 at 5:16 pm to Dr. Avel Peace , who verbally acknowledged these results. Electronically Signed   By: Delbert Phenix M.D.   On: 01/10/2016 17:19   Ct Abdomen W Contrast  Result Date: 01/10/2016 CLINICAL DATA:  Inpatient. Hypoxia. Left pneumothorax. Status post robotic hiatal hernia repair with Nissen fundoplication 1 day prior. EXAM: CT ANGIOGRAPHY CHEST CT ABDOMEN WITH CONTRAST TECHNIQUE: Multidetector CT imaging of the chest was performed using the standard protocol during bolus administration of intravenous contrast. Multiplanar CT image reconstructions and MIPs were obtained to evaluate the vascular anatomy. Multidetector CT imaging of the abdomen was performed using the standard protocol during bolus administration of intravenous contrast. CONTRAST:  100 cc Isovue 370 IV. COMPARISON:  09/10/2015 chest CT angiogram. Esophagram from earlier today. FINDINGS: CTA CHEST FINDINGS Cardiovascular: The study is moderate quality for the evaluation of pulmonary embolism, limited by motion artifact and limited contrast opacification, which limits evaluation of the subsegmental pulmonary arteries. There are no filling defects in the central, lobar, segmental or subsegmental pulmonary artery branches to suggest acute pulmonary embolism. Mildly  atherosclerotic tortuous nonaneurysmal thoracic aorta. Top-normal caliber main pulmonary artery (3.1 cm diameter). Normal heart size. No significant pericardial fluid/thickening. Mediastinum/Nodes: There is minimal scattered pneumomediastinum in the anterior mid to lower mediastinum. There  is a small amount of pneumomediastinum abutting the lower third of the thoracic esophagus, with no mediastinal fluid collections and no extraluminal oral contrast. No discrete thyroid nodules. Mildly patulous thoracic esophagus with oral contrast layering in the lower thoracic esophagus, with no appreciable esophageal wall thickening or pneumatosis. No pathologically enlarged axillary, mediastinal or hilar lymph nodes. Lungs/Pleura: Small anterior/apical left pneumothorax (approximately 10%). No mediastinal shift. No right pneumothorax. No pleural effusion. There is moderate to severe atelectasis in the lingula, bilateral lower lobes and dependent and central right upper lobe. No lung masses or significant pulmonary nodules in the aerated portions of the lungs. Mosaic attenuation throughout both lungs. Musculoskeletal: No aggressive appearing focal osseous lesions. Moderate long segment dextrocurvature of the thoracic spine with associated moderate spondylosis. Large amount of subcutaneous emphysema throughout the left anterior lower neck and entire left chest wall. Tiny amount of subcutaneous emphysema in the deep lower right chest wall. Review of the MIP images confirms the above findings. CT ABDOMEN FINDINGS Hepatobiliary: Normal liver with no liver mass. Normal gallbladder with no radiopaque cholelithiasis. No biliary ductal dilatation. Pancreas: Normal, with no mass or duct dilation. Spleen: Normal size. No mass. Adrenals/Urinary Tract: Normal adrenals. No hydronephrosis. No renal mass. Symmetrically delayed contrast nephrograms on the delayed sequence. Stomach/Bowel: There are expected postsurgical changes status post Nissen  fundoplication in the very proximal stomach with associated mild wall thickening at the fundoplication site. Otherwise no significant gastric distention, gastric wall thickening or pneumatosis. Visualized small and large bowel is normal caliber, with no bowel wall thickening. Vascular/Lymphatic: Atherosclerotic nonaneurysmal abdominal aorta. Patent portal, splenic, hepatic and renal veins. No pathologically enlarged lymph nodes in the abdomen. Other: Minimal pneumoperitoneum in the anterior upper peritoneal cavity bilaterally. No abdominal peritoneal fluid collections. No abdominal ascites. Percutaneous surgical drain terminates under the left hemidiaphragm. Musculoskeletal: No aggressive appearing focal osseous lesions. Long segment prominent levocurvature of the lumbar spine with associated moderate lumbar spondylosis. Extensive subcutaneous emphysema throughout the bilateral abdominal wall. No superficial fluid collections. Review of the MIP images confirms the above findings. IMPRESSION: 1. No evidence of pulmonary embolism, with limitations as described. 2. Small volume pneumomediastinum, probably due to expected postsurgical change. No extraluminal oral contrast or mediastinal fluid collections to suggest an esophageal leak. No esophageal wall thickening or pneumatosis. 3. Moderate to severe bilateral atelectasis as described. 4. Small 10% left pneumothorax, stable from chest radiograph from earlier today. No mediastinal shift. 5. Expected postsurgical changes in the proximal stomach on postoperative day 1 status post Nissen fundoplication. Minimal pneumoperitoneum in the anterior upper peritoneal cavity is within expected recent postoperative limits. No abdominal fluid collections. No evidence of bowel obstruction or acute visceral abdominal injury. 6. Extensive subcutaneous emphysema in the bilateral abdominal and lower chest wall. 7. Long segment moderate to severe S shaped thoracolumbar scoliosis. 8.  Aortic atherosclerosis. These results were called by telephone at the time of interpretation on 01/10/2016 at 5:16 pm to Dr. Avel PeaceDD ROSENBOWER , who verbally acknowledged these results. Electronically Signed   By: Delbert PhenixJason A Poff M.D.   On: 01/10/2016 17:19   Dg Chest Port 1 View  Result Date: 01/11/2016 CLINICAL DATA:  Left-sided pneumothorax. Left-sided chest pain since yesterday. Gastroesophageal reflux disease. EXAM: PORTABLE CHEST 1 VIEW COMPARISON:  CT and plain films of 1 day prior FINDINGS: Patient rotated minimally right. Cardiomegaly accentuated by AP portable technique. Lucency along the left heart border corresponds to pneumomediastinum on yesterday's CT. Small left pleural effusion. Left apical pneumothorax is decreased, 5%. 7 mm  from the chest wall today versus 17 mm on yesterday's plain film. Slight increase in patchy airspace disease with diminished lung volumes. Persistent subcutaneous emphysema about the left chest wall. IMPRESSION: Decrease in left apical pneumothorax, 5%. Lucency along the left heart border is likely related to pneumomediastinum, when correlated with yesterday's chest CT. Diminished lung volumes with increased airspace disease, favoring atelectasis. Electronically Signed   By: Jeronimo Greaves M.D.   On: 01/11/2016 07:23   Dg Chest Port 1 View  Result Date: 01/10/2016 CLINICAL DATA:  Short of breath.  Status post fundoplication. EXAM: PORTABLE CHEST 1 VIEW COMPARISON:  CT of 09/10/2015 FINDINGS: Patient rotated to the right. Cardiomegaly accentuated by AP portable technique. Mediastinal drain. Possible small left pleural effusion. Approximately 10-15% left apical pneumothorax is identified. Visceral pleural line maximally 1.7 cm from chest wall. Low lung volumes. Bibasilar atelectasis. Subcutaneous emphysema about the upper abdomen bilaterally. No [place] free intraperitoneal air. IMPRESSION: Development of an approximately 10-15% left apical pneumothorax. Low lung volumes with  bibasilar atelectasis. Possible small left pleural effusion. Critical test results telephoned to Dr Abbey Chatters. at the time of interpretation at 3:09 p.m.on 01/10/2016. Electronically Signed   By: Jeronimo Greaves M.D.   On: 01/10/2016 15:13   Dg Esophagus W/water Sol Cm  Result Date: 01/10/2016 CLINICAL DATA:  Status post Nissen fundoplication, abdominal/ chest pain EXAM: ESOPHOGRAM/BARIUM SWALLOW TECHNIQUE: Single contrast examination was performed using water-soluble contrast. FLUOROSCOPY TIME:  Fluoroscopy Time:  1 minutes 6 seconds Radiation Exposure Index (if provided by the fluoroscopic device): 32.7 mGy Number of Acquired Spot Images: 7 COMPARISON:  CTA chest dated 09/10/2015 FINDINGS: Scout radiograph demonstrates a surgical drain in the mediastinum. Moderate esophageal dilatation with esophageal dysmotility. Severe restriction at the site of the Nissen fundoplication wrap. A pocket of contrast flows into the gastric cardia and surrounds the left side of the wrap (series 4/ image 2). On real-time fluoroscopy (for example, series 6/image 32), a portion of of the contrast is believed to flow freely between this pocket and the gastric lumen. As such, this is not considered suspicious for contrast extravasation/contained perforation. Additional contrast layered in the mildly distended stomach. Moderate gaseous distension of nondependent transverse colon. IMPRESSION: Moderate esophageal dilatation with esophageal dysmotility. Severe restriction at the site of the Nissen fundoplication wrap. No findings suspicious for contrast extravasation/perforation. If symptoms persist, consider CT. Electronically Signed   By: Charline Bills M.D.   On: 01/10/2016 11:44    Note: This dictation was prepared with Dragon/digital dictation along with [employer]. Any transcriptional errors that result from this process are unintentional.

## 2016-01-11 NOTE — Progress Notes (Addendum)
[facility]  [address] [place], [place] [address] Phone: [phone] FAX: [phone]   Marijke Guadiana [id number] 30-Dec-1945  CARE TEAM:  PCP: Nance Pear., NP  Outpatient Care Team: Patient Care Team: Debbrah Alar, NP as PCP - General (Internal Medicine)  Inpatient Treatment Team: Treatment Team: Attending Provider: Ralene Ok, MD; Technician: Sueanne Margarita, NT; Registered Nurse: Alfonse Spruce, RN  Problem List:   Active Problems:   S/P Nissen fundoplication (with gastrostomy tube placement) ([place])   2 Days Post-Op  01/09/2016  Procedure(s): XI ROBOTIC HIATAL HERNIA  NISSEN FUNDOPLICATION  GASTROPEXY INSERTION OF MESH   Assessment  GUARDED  Chest pain with hypoxia - probably combination of fluid overload, small PTX, scoliosis w HH repair  (no MI, obvious esoph leak, PE, perforation, etc.  Doubt nec fasciitis)  Plan:  -Keep in SDU  -diuresis -[facility] IVF -pulmonary toilet -follow SQ air - doubt nec fascitis.  Bilateral & nontender on Right -PTX smaller - follow.  Keep oxygen on -add Decadron x 72 hr for NIssen fundo edema & hypoxia. ?Asp pneumonitis - no emesis event.  -add Zosyn x 72hr for now. 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  Hycet OK  -resume home Wellbutrin, etc  -VTE prophylaxis- SCDs, etc  -mobilize as tolerated to help recovery - get in chair  -CCM eval if worse.  No HD compromise / sepsis at this time  D/w pt & ICU RN.  Time = 60 minutes  Adin Hector, M.D., F.A.C.S. Gastrointestinal and Minimally Invasive Surgery [facility] [address] [place], [place] [address] [phone] Main / Paging   01/11/2016  Subjective:  Events noted Pain on chest wall W/u negative for leak, MI, PE, PNA PTX smaller on f/u CXR - 5%  Objective:  Vital signs:  Vitals:   01/11/16 0147 01/11/16 0158 01/11/16 0230 01/11/16 0400  BP: (!) 155/78 (!) 99/52 118/80 134/71  Pulse: (!) 103 94 94 97  Resp: _0 Temp:    98.9 F (37.2 C)  TempSrc:    Oral  SpO2: 90% 91% 92% 92%  Weight:      Height:           Intake/Output   Yesterday:  09/23 0701 - 09/24 0700 In: 2251.7 [I.V.:2091.7; IV Piggyback:160] Out: 1520 [Urine:895; Drains:625] This shift:  No intake/output data recorded.  Bowel function:  Flatus: ?  BM:  ?  Drain: Serosanguinous - thin   Physical Exam:  General: Pt sleepy but awakens in moderate acute distress Eyes: PERRL, normal EOM.  Sclera clear.  No icterus Neuro: CN II-XII intact w/o focal sensory/motor deficits. Lymph: No head/neck/groin lymphadenopathy Psych:  No delerium/psychosis/paranoia.   HENT: Normocephalic, Mucus membranes moist.  No thrush Neck: Supple, No tracheal deviation Chest: Left sided chest wall pain & fair excursion.  BS dec at bases.  SQ crepitus bilaterally - NT on R. No cellulitis, erythema, warmth CV:  Pulses intact.  Regular rhythm MS: Normal AROM mjr joints.  No obvious deformity Abdomen: Soft.  Mildy distended.  Mildly tender at incisions only.  No evidence of peritonitis.  No incarcerated hernias. Ext:  SCDs BLE.  No mjr edema.  No cyanosis Skin: No petechiae / purpura  Results:   Labs: Results for orders placed or performed during the hospital encounter of 01/09/16 (from the past 48 hour(s))  Basic metabolic panel     Status: Abnormal   Collection Time: 01/10/16  4:44 AM  Result Value Ref Range   Sodium 140 135 - 145 mmol/L   Potassium 4.4 3.5 - 5.1 mmol/L   Chloride 108 101 - 111 mmol/L   CO2 27 22 - 32 mmol/L   Glucose, Bld 167 (H) 65 - 99 mg/dL   BUN 20  6 - 20 mg/dL   Creatinine, Ser 0.84 0.44 - 1.00 mg/dL   Calcium 8.4 (L) 8.9 - 10.3 mg/dL   GFR calc non Af Amer >60 >60 mL/min   GFR calc Af Amer >60 >60 mL/min    Comment: (NOTE) The eGFR has been calculated using the CKD EPI equation. This calculation has not been validated in all clinical situations. eGFR's persistently <60 mL/min signify possible Chronic Kidney Disease.    Anion gap 5 5 - 15  CBC     Status: Abnormal   Collection Time: 01/10/16  3:01 PM  Result Value Ref Range   WBC 11.8 (H) 4.0 - 10.5 K/uL   RBC 4.45 3.87 - 5.11 MIL/uL   Hemoglobin 12.0 12.0 - 15.0 g/dL   HCT 39.8 36.0 - 46.0 %   MCV 89.4 78.0 - 100.0 fL   MCH 27.0 26.0 - 34.0 pg   MCHC 30.2 30.0 - 36.0 g/dL   RDW 14.1 11.5 - 15.5 %   Platelets 233 150 - 400 K/uL  MRSA PCR Screening     Status: None   Collection Time: 01/10/16  5:00 PM  Result Value Ref Range   MRSA by PCR NEGATIVE NEGATIVE    Comment:        The GeneXpert MRSA Assay (FDA approved for NASAL specimens only), is one component of a comprehensive MRSA colonization surveillance program. It is not intended to diagnose MRSA infection nor to guide or monitor treatment for MRSA infections.   Troponin I (q 6hr x 3)     Status: None   Collection Time: 01/10/16  5:05 PM  Result Value Ref Range   Troponin I <0.03 <0.03 ng/mL  Troponin I (q 6hr x 3)     Status: None   Collection Time: 01/10/16 10:25 PM  Result Value Ref Range   Troponin I <0.03 <0.03 ng/mL  Troponin I (q 6hr x 3)     Status: None   Collection Time: 01/11/16  5:09 AM  Result Value Ref Range   Troponin I <0.03 <0.03 ng/mL  CBC     Status: Abnormal   Collection Time: 01/11/16  5:09 AM  Result Value Ref Range   WBC 8.1 4.0 - 10.5 K/uL   RBC 4.01 3.87 - 5.11 MIL/uL   Hemoglobin 10.9 (L) 12.0 - 15.0 g/dL   HCT 36.3 36.0 - 46.0 %   MCV 90.5 78.0 - 100.0 fL   MCH 27.2 26.0 - 34.0 pg   MCHC 30.0 30.0 - 36.0 g/dL   RDW 14.5 11.5 - 15.5 %   Platelets 189 150 - 400 K/uL   Basic metabolic panel     Status: Abnormal   Collection Time: 01/11/16  5:09 AM  Result Value Ref Range   Sodium 141 135 - 145 mmol/L   Potassium 4.6 3.5 - 5.1 mmol/L   Chloride 109 101 - 111 mmol/L  CO2 27 22 - 32 mmol/L   Glucose, Bld 161 (H) 65 - 99 mg/dL   BUN 23 (H) 6 - 20 mg/dL   Creatinine, Ser 0.93 0.44 - 1.00 mg/dL   Calcium 8.6 (L) 8.9 - 10.3 mg/dL   GFR calc non Af Amer 60 (L) >60 mL/min   GFR calc Af Amer >60 >60 mL/min    Comment: (NOTE) The eGFR has been calculated using the CKD EPI equation. This calculation has not been validated in all clinical situations. eGFR's persistently <60 mL/min signify possible Chronic Kidney Disease.    Anion gap 5 5 - 15  Amylase     Status: Abnormal   Collection Time: 01/11/16  5:09 AM  Result Value Ref Range   Amylase 21 (L) 28 - 100 U/L    Imaging / Studies: Ct Angio Chest Pe W Or Wo Contrast  Result Date: 01/10/2016 CLINICAL DATA:  Inpatient. Hypoxia. Left pneumothorax. Status post robotic hiatal hernia repair with Nissen fundoplication 1 day prior. EXAM: CT ANGIOGRAPHY CHEST CT ABDOMEN WITH CONTRAST TECHNIQUE: Multidetector CT imaging of the chest was performed using the standard protocol during bolus administration of intravenous contrast. Multiplanar CT image reconstructions and MIPs were obtained to evaluate the vascular anatomy. Multidetector CT imaging of the abdomen was performed using the standard protocol during bolus administration of intravenous contrast. CONTRAST:  100 cc Isovue 370 IV. COMPARISON:  09/10/2015 chest CT angiogram. Esophagram from earlier today. FINDINGS: CTA CHEST FINDINGS Cardiovascular: The study is moderate quality for the evaluation of pulmonary embolism, limited by motion artifact and limited contrast opacification, which limits evaluation of the subsegmental pulmonary arteries. There are no filling defects in the central, lobar, segmental or subsegmental pulmonary artery branches to suggest acute  pulmonary embolism. Mildly atherosclerotic tortuous nonaneurysmal thoracic aorta. Top-normal caliber main pulmonary artery (3.1 cm diameter). Normal heart size. No significant pericardial fluid/thickening. Mediastinum/Nodes: There is minimal scattered pneumomediastinum in the anterior mid to lower mediastinum. There is a small amount of pneumomediastinum abutting the lower third of the thoracic esophagus, with no mediastinal fluid collections and no extraluminal oral contrast. No discrete thyroid nodules. Mildly patulous thoracic esophagus with oral contrast layering in the lower thoracic esophagus, with no appreciable esophageal wall thickening or pneumatosis. No pathologically enlarged axillary, mediastinal or hilar lymph nodes. Lungs/Pleura: Small anterior/apical left pneumothorax (approximately 10%). No mediastinal shift. No right pneumothorax. No pleural effusion. There is moderate to severe atelectasis in the lingula, bilateral lower lobes and dependent and central right upper lobe. No lung masses or significant pulmonary nodules in the aerated portions of the lungs. Mosaic attenuation throughout both lungs. Musculoskeletal: No aggressive appearing focal osseous lesions. Moderate long segment dextrocurvature of the thoracic spine with associated moderate spondylosis. Large amount of subcutaneous emphysema throughout the left anterior lower neck and entire left chest wall. Tiny amount of subcutaneous emphysema in the deep lower right chest wall. Review of the MIP images confirms the above findings. CT ABDOMEN FINDINGS Hepatobiliary: Normal liver with no liver mass. Normal gallbladder with no radiopaque cholelithiasis. No biliary ductal dilatation. Pancreas: Normal, with no mass or duct dilation. Spleen: Normal size. No mass. Adrenals/Urinary Tract: Normal adrenals. No hydronephrosis. No renal mass. Symmetrically delayed contrast nephrograms on the delayed sequence. Stomach/Bowel: There are expected postsurgical  changes status post Nissen fundoplication in the very proximal stomach with associated mild wall thickening at the fundoplication site. Otherwise no significant gastric distention, gastric wall thickening or pneumatosis. Visualized small and large bowel is normal caliber, with no bowel  wall thickening. Vascular/Lymphatic: Atherosclerotic nonaneurysmal abdominal aorta. Patent portal, splenic, hepatic and renal veins. No pathologically enlarged lymph nodes in the abdomen. Other: Minimal pneumoperitoneum in the anterior upper peritoneal cavity bilaterally. No abdominal peritoneal fluid collections. No abdominal ascites. Percutaneous surgical drain terminates under the left hemidiaphragm. Musculoskeletal: No aggressive appearing focal osseous lesions. Long segment prominent levocurvature of the lumbar spine with associated moderate lumbar spondylosis. Extensive subcutaneous emphysema throughout the bilateral abdominal wall. No superficial fluid collections. Review of the MIP images confirms the above findings. IMPRESSION: 1. No evidence of pulmonary embolism, with limitations as described. 2. Small volume pneumomediastinum, probably due to expected postsurgical change. No extraluminal oral contrast or mediastinal fluid collections to suggest an esophageal leak. No esophageal wall thickening or pneumatosis. 3. Moderate to severe bilateral atelectasis as described. 4. Small 10% left pneumothorax, stable from chest radiograph from earlier today. No mediastinal shift. 5. Expected postsurgical changes in the proximal stomach on postoperative day 1 status post Nissen fundoplication. Minimal pneumoperitoneum in the anterior upper peritoneal cavity is within expected recent postoperative limits. No abdominal fluid collections. No evidence of bowel obstruction or acute visceral abdominal injury. 6. Extensive subcutaneous emphysema in the bilateral abdominal and lower chest wall. 7. Long segment moderate to severe S shaped  thoracolumbar scoliosis. 8. Aortic atherosclerosis. These results were called by telephone at the time of interpretation on 01/10/2016 at 5:16 pm to Dr. Jackolyn Confer , who verbally acknowledged these results. Electronically Signed   By: Ilona Sorrel M.D.   On: 01/10/2016 17:19   Ct Abdomen W Contrast  Result Date: 01/10/2016 CLINICAL DATA:  Inpatient. Hypoxia. Left pneumothorax. Status post robotic hiatal hernia repair with Nissen fundoplication 1 day prior. EXAM: CT ANGIOGRAPHY CHEST CT ABDOMEN WITH CONTRAST TECHNIQUE: Multidetector CT imaging of the chest was performed using the standard protocol during bolus administration of intravenous contrast. Multiplanar CT image reconstructions and MIPs were obtained to evaluate the vascular anatomy. Multidetector CT imaging of the abdomen was performed using the standard protocol during bolus administration of intravenous contrast. CONTRAST:  100 cc Isovue 370 IV. COMPARISON:  09/10/2015 chest CT angiogram. Esophagram from earlier today. FINDINGS: CTA CHEST FINDINGS Cardiovascular: The study is moderate quality for the evaluation of pulmonary embolism, limited by motion artifact and limited contrast opacification, which limits evaluation of the subsegmental pulmonary arteries. There are no filling defects in the central, lobar, segmental or subsegmental pulmonary artery branches to suggest acute pulmonary embolism. Mildly atherosclerotic tortuous nonaneurysmal thoracic aorta. Top-normal caliber main pulmonary artery (3.1 cm diameter). Normal heart size. No significant pericardial fluid/thickening. Mediastinum/Nodes: There is minimal scattered pneumomediastinum in the anterior mid to lower mediastinum. There is a small amount of pneumomediastinum abutting the lower third of the thoracic esophagus, with no mediastinal fluid collections and no extraluminal oral contrast. No discrete thyroid nodules. Mildly patulous thoracic esophagus with oral contrast layering in the  lower thoracic esophagus, with no appreciable esophageal wall thickening or pneumatosis. No pathologically enlarged axillary, mediastinal or hilar lymph nodes. Lungs/Pleura: Small anterior/apical left pneumothorax (approximately 10%). No mediastinal shift. No right pneumothorax. No pleural effusion. There is moderate to severe atelectasis in the lingula, bilateral lower lobes and dependent and central right upper lobe. No lung masses or significant pulmonary nodules in the aerated portions of the lungs. Mosaic attenuation throughout both lungs. Musculoskeletal: No aggressive appearing focal osseous lesions. Moderate long segment dextrocurvature of the thoracic spine with associated moderate spondylosis. Large amount of subcutaneous emphysema throughout the left anterior lower neck and entire left chest  wall. Tiny amount of subcutaneous emphysema in the deep lower right chest wall. Review of the MIP images confirms the above findings. CT ABDOMEN FINDINGS Hepatobiliary: Normal liver with no liver mass. Normal gallbladder with no radiopaque cholelithiasis. No biliary ductal dilatation. Pancreas: Normal, with no mass or duct dilation. Spleen: Normal size. No mass. Adrenals/Urinary Tract: Normal adrenals. No hydronephrosis. No renal mass. Symmetrically delayed contrast nephrograms on the delayed sequence. Stomach/Bowel: There are expected postsurgical changes status post Nissen fundoplication in the very proximal stomach with associated mild wall thickening at the fundoplication site. Otherwise no significant gastric distention, gastric wall thickening or pneumatosis. Visualized small and large bowel is normal caliber, with no bowel wall thickening. Vascular/Lymphatic: Atherosclerotic nonaneurysmal abdominal aorta. Patent portal, splenic, hepatic and renal veins. No pathologically enlarged lymph nodes in the abdomen. Other: Minimal pneumoperitoneum in the anterior upper peritoneal cavity bilaterally. No abdominal  peritoneal fluid collections. No abdominal ascites. Percutaneous surgical drain terminates under the left hemidiaphragm. Musculoskeletal: No aggressive appearing focal osseous lesions. Long segment prominent levocurvature of the lumbar spine with associated moderate lumbar spondylosis. Extensive subcutaneous emphysema throughout the bilateral abdominal wall. No superficial fluid collections. Review of the MIP images confirms the above findings. IMPRESSION: 1. No evidence of pulmonary embolism, with limitations as described. 2. Small volume pneumomediastinum, probably due to expected postsurgical change. No extraluminal oral contrast or mediastinal fluid collections to suggest an esophageal leak. No esophageal wall thickening or pneumatosis. 3. Moderate to severe bilateral atelectasis as described. 4. Small 10% left pneumothorax, stable from chest radiograph from earlier today. No mediastinal shift. 5. Expected postsurgical changes in the proximal stomach on postoperative day 1 status post Nissen fundoplication. Minimal pneumoperitoneum in the anterior upper peritoneal cavity is within expected recent postoperative limits. No abdominal fluid collections. No evidence of bowel obstruction or acute visceral abdominal injury. 6. Extensive subcutaneous emphysema in the bilateral abdominal and lower chest wall. 7. Long segment moderate to severe S shaped thoracolumbar scoliosis. 8. Aortic atherosclerosis. These results were called by telephone at the time of interpretation on 01/10/2016 at 5:16 pm to Dr. Jackolyn Confer , who verbally acknowledged these results. Electronically Signed   By: Ilona Sorrel M.D.   On: 01/10/2016 17:19   Dg Chest Port 1 View  Result Date: 01/11/2016 CLINICAL DATA:  Left-sided pneumothorax. Left-sided chest pain since yesterday. Gastroesophageal reflux disease. EXAM: PORTABLE CHEST 1 VIEW COMPARISON:  CT and plain films of 1 day prior FINDINGS: Patient rotated minimally right. Cardiomegaly  accentuated by AP portable technique. Lucency along the left heart border corresponds to pneumomediastinum on yesterday's CT. Small left pleural effusion. Left apical pneumothorax is decreased, 5%. 7 mm from the chest wall today versus 17 mm on yesterday's plain film. Slight increase in patchy airspace disease with diminished lung volumes. Persistent subcutaneous emphysema about the left chest wall. IMPRESSION: Decrease in left apical pneumothorax, 5%. Lucency along the left heart border is likely related to pneumomediastinum, when correlated with yesterday's chest CT. Diminished lung volumes with increased airspace disease, favoring atelectasis. Electronically Signed   By: Abigail Miyamoto M.D.   On: 01/11/2016 07:23   Dg Chest Port 1 View  Result Date: 01/10/2016 CLINICAL DATA:  Short of breath.  Status post fundoplication. EXAM: PORTABLE CHEST 1 VIEW COMPARISON:  CT of 09/10/2015 FINDINGS: Patient rotated to the right. Cardiomegaly accentuated by AP portable technique. Mediastinal drain. Possible small left pleural effusion. Approximately 10-15% left apical pneumothorax is identified. Visceral pleural line maximally 1.7 cm from chest wall. Low lung volumes.  Bibasilar atelectasis. Subcutaneous emphysema about the upper abdomen bilaterally. No Talya Quain free intraperitoneal air. IMPRESSION: Development of an approximately 10-15% left apical pneumothorax. Low lung volumes with bibasilar atelectasis. Possible small left pleural effusion. Critical test results telephoned to Dr Zella Richer. at the time of interpretation at 3:09 p.m.on 01/10/2016. Electronically Signed   By: Abigail Miyamoto M.D.   On: 01/10/2016 15:13   Dg Esophagus W/water Sol Cm  Result Date: 01/10/2016 CLINICAL DATA:  Status post Nissen fundoplication, abdominal/ chest pain EXAM: ESOPHOGRAM/BARIUM SWALLOW TECHNIQUE: Single contrast examination was performed using water-soluble contrast. FLUOROSCOPY TIME:  Fluoroscopy Time:  1 minutes 6 seconds Radiation  Exposure Index (if provided by the fluoroscopic device): 32.7 mGy Number of Acquired Spot Images: 7 COMPARISON:  CTA chest dated 09/10/2015 FINDINGS: Scout radiograph demonstrates a surgical drain in the mediastinum. Moderate esophageal dilatation with esophageal dysmotility. Severe restriction at the site of the Nissen fundoplication wrap. A pocket of contrast flows into the gastric cardia and surrounds the left side of the wrap (series 4/ image 2). On real-time fluoroscopy (for example, series 6/image 32), a portion of of the contrast is believed to flow freely between this pocket and the gastric lumen. As such, this is not considered suspicious for contrast extravasation/contained perforation. Additional contrast layered in the mildly distended stomach. Moderate gaseous distension of nondependent transverse colon. IMPRESSION: Moderate esophageal dilatation with esophageal dysmotility. Severe restriction at the site of the Nissen fundoplication wrap. No findings suspicious for contrast extravasation/perforation. If symptoms persist, consider CT. Electronically Signed   By: Julian Hy M.D.   On: 01/10/2016 11:44    Medications / Allergies: per chart  Antibiotics: Anti-infectives    Start     Dose/Rate Route Frequency Ordered Stop   01/09/16 1012  ceFAZolin (ANCEF) IVPB 2g/100 mL premix     2 g 200 mL/hr over 30 Minutes Intravenous On call to O.R. 01/09/16 1012 01/09/16 1214        Note: Portions of this report may have been transcribed using voice recognition software. Every effort was made to ensure accuracy; however, inadvertent computerized transcription errors may be present.   Any transcriptional errors that result from this process are unintentional.     Adin Hector, M.D., F.A.C.S. Gastrointestinal and Minimally Invasive Surgery [facility] [address], [place], [place] [address] [phone] Main / Paging   01/11/2016

## 2016-01-11 NOTE — Progress Notes (Signed)
Patient has not tolerated CPT secondary to pain, per RT bedside report. In conversation, patient agrees the therapy is too painful. RT assessment completed. Orders changed accordingly.

## 2016-01-11 NOTE — Progress Notes (Signed)
Patient began to have worsening left sided, non-radiating chest pain. VS taken. Prn pain medicine given. EKG done. Dr.Rosenbower called and notified.

## 2016-01-12 ENCOUNTER — Inpatient Hospital Stay (HOSPITAL_COMMUNITY): Payer: Medicare Other

## 2016-01-12 ENCOUNTER — Encounter (HOSPITAL_COMMUNITY): Payer: Self-pay | Admitting: General Surgery

## 2016-01-12 LAB — COMPREHENSIVE METABOLIC PANEL
ALT: 193 U/L — ABNORMAL HIGH (ref 14–54)
ANION GAP: 8 (ref 5–15)
AST: 117 U/L — ABNORMAL HIGH (ref 15–41)
Albumin: 3.7 g/dL (ref 3.5–5.0)
Alkaline Phosphatase: 51 U/L (ref 38–126)
BUN: 21 mg/dL — ABNORMAL HIGH (ref 6–20)
CHLORIDE: 107 mmol/L (ref 101–111)
CO2: 25 mmol/L (ref 22–32)
Calcium: 8.9 mg/dL (ref 8.9–10.3)
Creatinine, Ser: 0.79 mg/dL (ref 0.44–1.00)
Glucose, Bld: 129 mg/dL — ABNORMAL HIGH (ref 65–99)
POTASSIUM: 3.9 mmol/L (ref 3.5–5.1)
SODIUM: 140 mmol/L (ref 135–145)
Total Bilirubin: 1 mg/dL (ref 0.3–1.2)
Total Protein: 7 g/dL (ref 6.5–8.1)

## 2016-01-12 LAB — CBC
HCT: 33 % — ABNORMAL LOW (ref 36.0–46.0)
HEMOGLOBIN: 10.2 g/dL — AB (ref 12.0–15.0)
MCH: 27.5 pg (ref 26.0–34.0)
MCHC: 30.9 g/dL (ref 30.0–36.0)
MCV: 88.9 fL (ref 78.0–100.0)
PLATELETS: 198 10*3/uL (ref 150–400)
RBC: 3.71 MIL/uL — AB (ref 3.87–5.11)
RDW: 14.2 % (ref 11.5–15.5)
WBC: 7.7 10*3/uL (ref 4.0–10.5)

## 2016-01-12 LAB — AMYLASE, PERITONEAL FLUID: AMYLASE, PERITONEAL FLUID: 15 U/L

## 2016-01-12 LAB — LIPASE, BLOOD: LIPASE: 17 U/L (ref 11–51)

## 2016-01-12 MED ORDER — FENTANYL CITRATE (PF) 100 MCG/2ML IJ SOLN
25.0000 ug | INTRAMUSCULAR | Status: DC | PRN
  Administered 2016-01-12 – 2016-01-13 (×6): 50 ug via INTRAVENOUS
  Filled 2016-01-12 (×6): qty 2

## 2016-01-12 MED ORDER — PANTOPRAZOLE SODIUM 40 MG PO TBEC
40.0000 mg | DELAYED_RELEASE_TABLET | Freq: Two times a day (BID) | ORAL | Status: DC
  Administered 2016-01-12 (×2): 40 mg via ORAL
  Filled 2016-01-12 (×4): qty 1

## 2016-01-12 MED ORDER — HYDRALAZINE HCL 20 MG/ML IJ SOLN: 5.0000 mg | Freq: Four times a day (QID) | INTRAMUSCULAR | Status: DC | PRN

## 2016-01-12 MED ORDER — METHOCARBAMOL 500 MG PO TABS
1000.0000 mg | ORAL_TABLET | Freq: Four times a day (QID) | ORAL | Status: DC | PRN
Start: 2016-01-12 — End: 2016-01-13
  Administered 2016-01-12: 1000 mg via ORAL
  Filled 2016-01-12: qty 2

## 2016-01-12 MED ORDER — ENALAPRILAT 1.25 MG/ML IV SOLN
0.6250 mg | Freq: Four times a day (QID) | INTRAVENOUS | Status: DC | PRN
  Filled 2016-01-12: qty 1

## 2016-01-12 MED ORDER — NAPROXEN 500 MG PO TABS
500.0000 mg | ORAL_TABLET | Freq: Two times a day (BID) | ORAL | Status: DC
  Administered 2016-01-12 – 2016-01-16 (×8): 500 mg via ORAL
  Filled 2016-01-12 (×11): qty 1

## 2016-01-12 MED ORDER — METHOCARBAMOL 1000 MG/10ML IJ SOLN
1000.0000 mg | Freq: Four times a day (QID) | INTRAVENOUS | Status: DC | PRN
  Filled 2016-01-12: qty 10

## 2016-01-12 MED ORDER — ACETAMINOPHEN 500 MG PO TABS: 1000.0000 mg | ORAL_TABLET | Freq: Four times a day (QID) | ORAL | Status: DC | PRN

## 2016-01-12 MED ORDER — SIMETHICONE 80 MG PO CHEW: 80.0000 mg | CHEWABLE_TABLET | Freq: Four times a day (QID) | ORAL | Status: DC | PRN

## 2016-01-12 MED ORDER — NAPROXEN 500 MG PO TABS
500.0000 mg | ORAL_TABLET | Freq: Two times a day (BID) | ORAL | Status: DC
  Filled 2016-01-12: qty 1

## 2016-01-12 MED ORDER — METHOCARBAMOL 1000 MG/10ML IJ SOLN
1000.0000 mg | Freq: Three times a day (TID) | INTRAVENOUS | Status: DC
  Filled 2016-01-12: qty 10

## 2016-01-12 NOTE — Progress Notes (Addendum)
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  PTX resolved.  SQ crepitus resolved.  Doubt nec fasciitis)  Agitation/anxiety - better on CIWA  Plan: -CIWA protocol for anxiety, h/o EtOH ingestion -Keep in SDU -Dickson IVF with PRN IVF backup -pulmonary toilet -Wean oxygen - on FM (doesn't like Numa) but sat 98% on 6L now -Decadron x 72 hr for NIssen fundo edema & hypoxia. ?Asp pneumonitis - no emesis event. -Zosyn x 5 d for now. ?Asp event -No leak by normal mediastinal peritoneal amylase, negative by UGI & CT chest/abd negative for leak -no evid pancreatitis - pancreas reduced from giant hiatal hernia  -pain control - reduce IV  fentanyl dose/frequency since sleeping,  Try to use more PO - robaxin/naproxen/tylenol/ice/heat.  PO oxycodone  -resume home Wellbutrin, etc  -adv pureed diet since tol 960 PO w/o cough/dysphagia.  Hold on swallow eval   -mobilize as  tolerated to help recovery - get in chair.  Get up  -CCM eval if worse.  No HD compromise / sepsis at this time  -VTE prophylaxis- SCDs, enoxaparin, etc  -D/w pt & ICU RN.  Time = 40 minutes  Adin Hector, M.D., F.A.C.S. Gastrointestinal and Minimally Invasive Surgery [facility] [address] [place], [place] [address] [phone] Main / Paging   01/12/2016  Subjective:  Wanting pain meds - then sleeps Anxious worsening - placed in CIWA protocol  Objective:  Vital signs:  Vitals:   01/12/16 0400 01/12/16 0439 01/12/16 0500 01/12/16 0600  BP:   (!) 97/57 (!) 88/64  Pulse:      Resp: '16  17 16  ' Temp:  97.5 F (36.4 C)    TempSrc:  Axillary    SpO2: 100%  100% 100%  Weight:      Height:        Last BM Date: 01/11/16  Intake/Output   Yesterday:  09/24 0701 - 09/25 0700 In: 1500.2 [P.O.:960; I.V.:290.2; IV Piggyback:250] Out: 2320 [Urine:1980; Drains:340] This shift:  No intake/output data recorded.  Bowel function:  Flatus: YES  BM:  YES  Drain: Serosanguinous - thin.  Drain amylase 15 (serum 21)   Physical Exam:  General: Pt sleepy but awakens in mild acute distress Eyes: PERRL, normal EOM.  Sclera clear.  No icterus Neuro: CN II-XII intact w/o focal sensory/motor deficits. Lymph: No head/neck/groin lymphadenopathy Psych:  No delerium/psychosis/paranoia.   HENT: Normocephalic, Mucus membranes moist.  No thrush Neck: Supple, No tracheal deviation Chest: No chest wall pain & fair excursion.  BS dec at  bases.  SQ crepitus gone. No cellulitis, erythema, warmth CV:  Pulses intact.  Regular rhythm MS: Normal AROM mjr joints.  No obvious deformity Abdomen: Soft. Obese.  Mildy distended.  Tenderness at LUQ & drain site only.  No evidence of peritonitis.  No incarcerated hernias. Ext:  SCDs BLE.  No mjr edema.  No cyanosis Skin: No petechiae / purpura  Results:   Labs: Results for orders placed or performed during  the hospital encounter of 01/09/16 (from the past 48 hour(s))  CBC     Status: Abnormal   Collection Time: 01/10/16  3:01 PM  Result Value Ref Range   WBC 11.8 (H) 4.0 - 10.5 K/uL   RBC 4.45 3.87 - 5.11 MIL/uL   Hemoglobin 12.0 12.0 - 15.0 g/dL   HCT 39.8 36.0 - 46.0 %   MCV 89.4 78.0 - 100.0 fL   MCH 27.0 26.0 - 34.0 pg   MCHC 30.2 30.0 - 36.0 g/dL   RDW 14.1 11.5 - 15.5 %   Platelets 233 150 - 400 K/uL  MRSA PCR Screening     Status: None   Collection Time: 01/10/16  5:00 PM  Result Value Ref Range   MRSA by PCR NEGATIVE NEGATIVE    Comment:        The GeneXpert MRSA Assay (FDA approved for NASAL specimens only), is one component of a comprehensive MRSA colonization surveillance program. It is not intended to diagnose MRSA infection nor to guide or monitor treatment for MRSA infections.   Troponin I (q 6hr x 3)     Status: None   Collection Time: 01/10/16  5:05 PM  Result Value Ref Range   Troponin I <0.03 <0.03 ng/mL  Troponin I (q 6hr x 3)     Status: None   Collection Time: 01/10/16 10:25 PM  Result Value Ref Range   Troponin I <0.03 <0.03 ng/mL  Troponin I (q 6hr x 3)     Status: None   Collection Time: 01/11/16  5:09 AM  Result Value Ref Range   Troponin I <0.03 <0.03 ng/mL  CBC     Status: Abnormal   Collection Time: 01/11/16  5:09 AM  Result Value Ref Range   WBC 8.1 4.0 - 10.5 K/uL   RBC 4.01 3.87 - 5.11 MIL/uL   Hemoglobin 10.9 (L) 12.0 - 15.0 g/dL   HCT 36.3 36.0 - 46.0 %   MCV 90.5 78.0 - 100.0 fL   MCH 27.2 26.0 - 34.0 pg   MCHC 30.0 30.0 - 36.0 g/dL   RDW 14.5 11.5 - 15.5 %   Platelets 189 150 - 400 K/uL  Basic metabolic panel     Status: Abnormal   Collection Time: 01/11/16  5:09 AM  Result Value Ref Range   Sodium 141 135 - 145 mmol/L   Potassium 4.6 3.5 - 5.1 mmol/L   Chloride 109 101 - 111 mmol/L   CO2 27 22 - 32 mmol/L   Glucose, Bld 161 (H) 65 - 99 mg/dL   BUN 23 (H) 6 - 20 mg/dL   Creatinine, Ser 0.93 0.44 - 1.00 mg/dL   Calcium 8.6  (L) 8.9 - 10.3 mg/dL   GFR calc non Af Amer 60 (L) >60 mL/min   GFR calc Af Amer >60 >60 mL/min    Comment: (NOTE) The eGFR has been calculated using the CKD EPI equation. This calculation has not been validated in all clinical situations. eGFR's persistently <60 mL/min signify possible Chronic Kidney Disease.  Anion gap 5 5 - 15  Amylase     Status: Abnormal   Collection Time: 01/11/16  5:09 AM  Result Value Ref Range   Amylase 21 (L) 28 - 100 U/L  Lipase, blood     Status: None   Collection Time: 01/11/16  5:09 AM  Result Value Ref Range   Lipase 19 11 - 51 U/L  Amylase, Peritoneal Fluid     Status: None   Collection Time: 01/11/16  7:26 AM  Result Value Ref Range   Amylase, peritoneal fluid 15 U/L    Comment: (NOTE) Reference range: Values greater than or equal to three times a simultaneously analyzed serum value are considered abnormal in ascites of pancreatic origin. Other peritoneal and intestional disorders may also cause elevation of amylase. Performed at [facility]   CBC     Status: Abnormal   Collection Time: 01/12/16  3:26 AM  Result Value Ref Range   WBC 7.7 4.0 - 10.5 K/uL   RBC 3.71 (L) 3.87 - 5.11 MIL/uL   Hemoglobin 10.2 (L) 12.0 - 15.0 g/dL   HCT 33.0 (L) 36.0 - 46.0 %   MCV 88.9 78.0 - 100.0 fL   MCH 27.5 26.0 - 34.0 pg   MCHC 30.9 30.0 - 36.0 g/dL   RDW 14.2 11.5 - 15.5 %   Platelets 198 150 - 400 K/uL  Comprehensive metabolic panel     Status: Abnormal   Collection Time: 01/12/16  3:26 AM  Result Value Ref Range   Sodium 140 135 - 145 mmol/L   Potassium 3.9 3.5 - 5.1 mmol/L   Chloride 107 101 - 111 mmol/L   CO2 25 22 - 32 mmol/L   Glucose, Bld 129 (H) 65 - 99 mg/dL   BUN 21 (H) 6 - 20 mg/dL   Creatinine, Ser 0.79 0.44 - 1.00 mg/dL   Calcium 8.9 8.9 - 10.3 mg/dL   Total Protein 7.0 6.5 - 8.1 g/dL   Albumin 3.7 3.5 - 5.0 g/dL   AST 117 (H) 15 - 41 U/L   ALT 193 (H) 14 - 54 U/L   Alkaline Phosphatase 51 38 - 126 U/L   Total  Bilirubin 1.0 0.3 - 1.2 mg/dL   GFR calc non Af Amer >60 >60 mL/min   GFR calc Af Amer >60 >60 mL/min    Comment: (NOTE) The eGFR has been calculated using the CKD EPI equation. This calculation has not been validated in all clinical situations. eGFR's persistently <60 mL/min signify possible Chronic Kidney Disease.    Anion gap 8 5 - 15  Lipase, blood     Status: None   Collection Time: 01/12/16  3:26 AM  Result Value Ref Range   Lipase 17 11 - 51 U/L    Imaging / Studies: Ct Angio Chest Pe W Or Wo Contrast  Result Date: 01/10/2016 CLINICAL DATA:  Inpatient. Hypoxia. Left pneumothorax. Status post robotic hiatal hernia repair with Nissen fundoplication 1 day prior. EXAM: CT ANGIOGRAPHY CHEST CT ABDOMEN WITH CONTRAST TECHNIQUE: Multidetector CT imaging of the chest was performed using the standard protocol during bolus administration of intravenous contrast. Multiplanar CT image reconstructions and MIPs were obtained to evaluate the vascular anatomy. Multidetector CT imaging of the abdomen was performed using the standard protocol during bolus administration of intravenous contrast. CONTRAST:  100 cc Isovue 370 IV. COMPARISON:  09/10/2015 chest CT angiogram. Esophagram from earlier today. FINDINGS: CTA CHEST FINDINGS Cardiovascular: The study is moderate quality for the evaluation of  pulmonary embolism, limited by motion artifact and limited contrast opacification, which limits evaluation of the subsegmental pulmonary arteries. There are no filling defects in the central, lobar, segmental or subsegmental pulmonary artery branches to suggest acute pulmonary embolism. Mildly atherosclerotic tortuous nonaneurysmal thoracic aorta. Top-normal caliber main pulmonary artery (3.1 cm diameter). Normal heart size. No significant pericardial fluid/thickening. Mediastinum/Nodes: There is minimal scattered pneumomediastinum in the anterior mid to lower mediastinum. There is a small amount of pneumomediastinum  abutting the lower third of the thoracic esophagus, with no mediastinal fluid collections and no extraluminal oral contrast. No discrete thyroid nodules. Mildly patulous thoracic esophagus with oral contrast layering in the lower thoracic esophagus, with no appreciable esophageal wall thickening or pneumatosis. No pathologically enlarged axillary, mediastinal or hilar lymph nodes. Lungs/Pleura: Small anterior/apical left pneumothorax (approximately 10%). No mediastinal shift. No right pneumothorax. No pleural effusion. There is moderate to severe atelectasis in the lingula, bilateral lower lobes and dependent and central right upper lobe. No lung masses or significant pulmonary nodules in the aerated portions of the lungs. Mosaic attenuation throughout both lungs. Musculoskeletal: No aggressive appearing focal osseous lesions. Moderate long segment dextrocurvature of the thoracic spine with associated moderate spondylosis. Large amount of subcutaneous emphysema throughout the left anterior lower neck and entire left chest wall. Tiny amount of subcutaneous emphysema in the deep lower right chest wall. Review of the MIP images confirms the above findings. CT ABDOMEN FINDINGS Hepatobiliary: Normal liver with no liver mass. Normal gallbladder with no radiopaque cholelithiasis. No biliary ductal dilatation. Pancreas: Normal, with no mass or duct dilation. Spleen: Normal size. No mass. Adrenals/Urinary Tract: Normal adrenals. No hydronephrosis. No renal mass. Symmetrically delayed contrast nephrograms on the delayed sequence. Stomach/Bowel: There are expected postsurgical changes status post Nissen fundoplication in the very proximal stomach with associated mild wall thickening at the fundoplication site. Otherwise no significant gastric distention, gastric wall thickening or pneumatosis. Visualized small and large bowel is normal caliber, with no bowel wall thickening. Vascular/Lymphatic: Atherosclerotic nonaneurysmal  abdominal aorta. Patent portal, splenic, hepatic and renal veins. No pathologically enlarged lymph nodes in the abdomen. Other: Minimal pneumoperitoneum in the anterior upper peritoneal cavity bilaterally. No abdominal peritoneal fluid collections. No abdominal ascites. Percutaneous surgical drain terminates under the left hemidiaphragm. Musculoskeletal: No aggressive appearing focal osseous lesions. Long segment prominent levocurvature of the lumbar spine with associated moderate lumbar spondylosis. Extensive subcutaneous emphysema throughout the bilateral abdominal wall. No superficial fluid collections. Review of the MIP images confirms the above findings. IMPRESSION: 1. No evidence of pulmonary embolism, with limitations as described. 2. Small volume pneumomediastinum, probably due to expected postsurgical change. No extraluminal oral contrast or mediastinal fluid collections to suggest an esophageal leak. No esophageal wall thickening or pneumatosis. 3. Moderate to severe bilateral atelectasis as described. 4. Small 10% left pneumothorax, stable from chest radiograph from earlier today. No mediastinal shift. 5. Expected postsurgical changes in the proximal stomach on postoperative day 1 status post Nissen fundoplication. Minimal pneumoperitoneum in the anterior upper peritoneal cavity is within expected recent postoperative limits. No abdominal fluid collections. No evidence of bowel obstruction or acute visceral abdominal injury. 6. Extensive subcutaneous emphysema in the bilateral abdominal and lower chest wall. 7. Long segment moderate to severe S shaped thoracolumbar scoliosis. 8. Aortic atherosclerosis. These results were called by telephone at the time of interpretation on 01/10/2016 at 5:16 pm to Dr. Jackolyn Confer , who verbally acknowledged these results. Electronically Signed   By: Ilona Sorrel M.D.   On: 01/10/2016 17:19  Ct Abdomen W Contrast  Result Date: 01/10/2016 CLINICAL DATA:  Inpatient.  Hypoxia. Left pneumothorax. Status post robotic hiatal hernia repair with Nissen fundoplication 1 day prior. EXAM: CT ANGIOGRAPHY CHEST CT ABDOMEN WITH CONTRAST TECHNIQUE: Multidetector CT imaging of the chest was performed using the standard protocol during bolus administration of intravenous contrast. Multiplanar CT image reconstructions and MIPs were obtained to evaluate the vascular anatomy. Multidetector CT imaging of the abdomen was performed using the standard protocol during bolus administration of intravenous contrast. CONTRAST:  100 cc Isovue 370 IV. COMPARISON:  09/10/2015 chest CT angiogram. Esophagram from earlier today. FINDINGS: CTA CHEST FINDINGS Cardiovascular: The study is moderate quality for the evaluation of pulmonary embolism, limited by motion artifact and limited contrast opacification, which limits evaluation of the subsegmental pulmonary arteries. There are no filling defects in the central, lobar, segmental or subsegmental pulmonary artery branches to suggest acute pulmonary embolism. Mildly atherosclerotic tortuous nonaneurysmal thoracic aorta. Top-normal caliber main pulmonary artery (3.1 cm diameter). Normal heart size. No significant pericardial fluid/thickening. Mediastinum/Nodes: There is minimal scattered pneumomediastinum in the anterior mid to lower mediastinum. There is a small amount of pneumomediastinum abutting the lower third of the thoracic esophagus, with no mediastinal fluid collections and no extraluminal oral contrast. No discrete thyroid nodules. Mildly patulous thoracic esophagus with oral contrast layering in the lower thoracic esophagus, with no appreciable esophageal wall thickening or pneumatosis. No pathologically enlarged axillary, mediastinal or hilar lymph nodes. Lungs/Pleura: Small anterior/apical left pneumothorax (approximately 10%). No mediastinal shift. No right pneumothorax. No pleural effusion. There is moderate to severe atelectasis in the lingula,  bilateral lower lobes and dependent and central right upper lobe. No lung masses or significant pulmonary nodules in the aerated portions of the lungs. Mosaic attenuation throughout both lungs. Musculoskeletal: No aggressive appearing focal osseous lesions. Moderate long segment dextrocurvature of the thoracic spine with associated moderate spondylosis. Large amount of subcutaneous emphysema throughout the left anterior lower neck and entire left chest wall. Tiny amount of subcutaneous emphysema in the deep lower right chest wall. Review of the MIP images confirms the above findings. CT ABDOMEN FINDINGS Hepatobiliary: Normal liver with no liver mass. Normal gallbladder with no radiopaque cholelithiasis. No biliary ductal dilatation. Pancreas: Normal, with no mass or duct dilation. Spleen: Normal size. No mass. Adrenals/Urinary Tract: Normal adrenals. No hydronephrosis. No renal mass. Symmetrically delayed contrast nephrograms on the delayed sequence. Stomach/Bowel: There are expected postsurgical changes status post Nissen fundoplication in the very proximal stomach with associated mild wall thickening at the fundoplication site. Otherwise no significant gastric distention, gastric wall thickening or pneumatosis. Visualized small and large bowel is normal caliber, with no bowel wall thickening. Vascular/Lymphatic: Atherosclerotic nonaneurysmal abdominal aorta. Patent portal, splenic, hepatic and renal veins. No pathologically enlarged lymph nodes in the abdomen. Other: Minimal pneumoperitoneum in the anterior upper peritoneal cavity bilaterally. No abdominal peritoneal fluid collections. No abdominal ascites. Percutaneous surgical drain terminates under the left hemidiaphragm. Musculoskeletal: No aggressive appearing focal osseous lesions. Long segment prominent levocurvature of the lumbar spine with associated moderate lumbar spondylosis. Extensive subcutaneous emphysema throughout the bilateral abdominal wall. No  superficial fluid collections. Review of the MIP images confirms the above findings. IMPRESSION: 1. No evidence of pulmonary embolism, with limitations as described. 2. Small volume pneumomediastinum, probably due to expected postsurgical change. No extraluminal oral contrast or mediastinal fluid collections to suggest an esophageal leak. No esophageal wall thickening or pneumatosis. 3. Moderate to severe bilateral atelectasis as described. 4. Small 10% left pneumothorax, stable from chest  radiograph from earlier today. No mediastinal shift. 5. Expected postsurgical changes in the proximal stomach on postoperative day 1 status post Nissen fundoplication. Minimal pneumoperitoneum in the anterior upper peritoneal cavity is within expected recent postoperative limits. No abdominal fluid collections. No evidence of bowel obstruction or acute visceral abdominal injury. 6. Extensive subcutaneous emphysema in the bilateral abdominal and lower chest wall. 7. Long segment moderate to severe S shaped thoracolumbar scoliosis. 8. Aortic atherosclerosis. These results were called by telephone at the time of interpretation on 01/10/2016 at 5:16 pm to Dr. Jackolyn Confer , who verbally acknowledged these results. Electronically Signed   By: Ilona Sorrel M.D.   On: 01/10/2016 17:19   Dg Chest Port 1 View  Result Date: 01/12/2016 CLINICAL DATA:  Pneumothorax. EXAM: PORTABLE CHEST 1 VIEW COMPARISON:  01/11/2016.  CT 01/10/2016. FINDINGS: Stable cardiomegaly. Stable atelectasis right upper lobe and both lung bases. No prominent pleural effusion. No pneumothorax noted on today's exam. Surgical tubing noted over the left upper abdomen/lower chest. IMPRESSION: 1.  Interval resolution of left-sided pneumothorax. 2.  Stable cardiomegaly. 3. Persistent prominent atelectatic changes right upper lobe and both lung bases. Electronically Signed   By: Marcello Moores  Register   On: 01/12/2016 06:33   Dg Chest Port 1 View  Result Date:  01/11/2016 CLINICAL DATA:  Left-sided pneumothorax. Left-sided chest pain since yesterday. Gastroesophageal reflux disease. EXAM: PORTABLE CHEST 1 VIEW COMPARISON:  CT and plain films of 1 day prior FINDINGS: Patient rotated minimally right. Cardiomegaly accentuated by AP portable technique. Lucency along the left heart border corresponds to pneumomediastinum on yesterday's CT. Small left pleural effusion. Left apical pneumothorax is decreased, 5%. 7 mm from the chest wall today versus 17 mm on yesterday's plain film. Slight increase in patchy airspace disease with diminished lung volumes. Persistent subcutaneous emphysema about the left chest wall. IMPRESSION: Decrease in left apical pneumothorax, 5%. Lucency along the left heart border is likely related to pneumomediastinum, when correlated with yesterday's chest CT. Diminished lung volumes with increased airspace disease, favoring atelectasis. Electronically Signed   By: Abigail Miyamoto M.D.   On: 01/11/2016 07:23   Dg Chest Port 1 View  Result Date: 01/10/2016 CLINICAL DATA:  Short of breath.  Status post fundoplication. EXAM: PORTABLE CHEST 1 VIEW COMPARISON:  CT of 09/10/2015 FINDINGS: Patient rotated to the right. Cardiomegaly accentuated by AP portable technique. Mediastinal drain. Possible small left pleural effusion. Approximately 10-15% left apical pneumothorax is identified. Visceral pleural line maximally 1.7 cm from chest wall. Low lung volumes. Bibasilar atelectasis. Subcutaneous emphysema about the upper abdomen bilaterally. No Ohanna Gassert free intraperitoneal air. IMPRESSION: Development of an approximately 10-15% left apical pneumothorax. Low lung volumes with bibasilar atelectasis. Possible small left pleural effusion. Critical test results telephoned to Dr Zella Richer. at the time of interpretation at 3:09 p.m.on 01/10/2016. Electronically Signed   By: Abigail Miyamoto M.D.   On: 01/10/2016 15:13   Dg Esophagus W/water Sol Cm  Result Date:  01/10/2016 CLINICAL DATA:  Status post Nissen fundoplication, abdominal/ chest pain EXAM: ESOPHOGRAM/BARIUM SWALLOW TECHNIQUE: Single contrast examination was performed using water-soluble contrast. FLUOROSCOPY TIME:  Fluoroscopy Time:  1 minutes 6 seconds Radiation Exposure Index (if provided by the fluoroscopic device): 32.7 mGy Number of Acquired Spot Images: 7 COMPARISON:  CTA chest dated 09/10/2015 FINDINGS: Scout radiograph demonstrates a surgical drain in the mediastinum. Moderate esophageal dilatation with esophageal dysmotility. Severe restriction at the site of the Nissen fundoplication wrap. A pocket of contrast flows into the gastric cardia and surrounds the  left side of the wrap (series 4/ image 2). On real-time fluoroscopy (for example, series 6/image 32), a portion of of the contrast is believed to flow freely between this pocket and the gastric lumen. As such, this is not considered suspicious for contrast extravasation/contained perforation. Additional contrast layered in the mildly distended stomach. Moderate gaseous distension of nondependent transverse colon. IMPRESSION: Moderate esophageal dilatation with esophageal dysmotility. Severe restriction at the site of the Nissen fundoplication wrap. No findings suspicious for contrast extravasation/perforation. If symptoms persist, consider CT. Electronically Signed   By: Julian Hy M.D.   On: 01/10/2016 11:44    Medications / Allergies: per chart  Antibiotics: Anti-infectives    Start     Dose/Rate Route Frequency Ordered Stop   01/11/16 1000  piperacillin-tazobactam (ZOSYN) IVPB 3.375 g     3.375 g 12.5 mL/hr over 240 Minutes Intravenous Every 8 hours 01/11/16 0852     01/09/16 1012  ceFAZolin (ANCEF) IVPB 2g/100 mL premix     2 g 200 mL/hr over 30 Minutes Intravenous On call to O.R. 01/09/16 1012 01/09/16 1214        Note: Portions of this report may have been transcribed using voice recognition software. Every effort was  made to ensure accuracy; however, inadvertent computerized transcription errors may be present.   Any transcriptional errors that result from this process are unintentional.     Adin Hector, M.D., F.A.C.S. Gastrointestinal and Minimally Invasive Surgery [facility] [address] [place], [place] [address] [phone] Main / Paging   01/12/2016

## 2016-01-12 NOTE — Progress Notes (Signed)
Date:  January 12, 2016 Chart reviewed for concurrent status and case management needs. Will continue to follow the patient for status change: 6l/min/[place] 02 Discharge Planning: following for needs Expected discharge date: [id number] Marcelle SmilingRhonda Davis, BSN, [place]3, [place]   [phone]

## 2016-01-12 NOTE — Progress Notes (Signed)
PT Cancellation Note  Patient Details Name: Suszanne ConnersShelia Showers MRN: [id number] DOB: 08-Apr-1946   Cancelled Treatment:    Reason Eval/Treat Not Completed: Pain limiting ability to participate (per RN pt recently returned to bed from chair and is having abdominal pain. Will follow. )   Tamala SerUhlenberg, Amerie Beaumont Kistler 01/12/2016, 12:30 PM [phone]

## 2016-01-12 NOTE — Progress Notes (Signed)
Patient very anxious and confused. MD aware, Ativan and CIWA now on board.

## 2016-01-13 DIAGNOSIS — R410 Disorientation, unspecified: Secondary | ICD-10-CM

## 2016-01-13 DIAGNOSIS — F1020 Alcohol dependence, uncomplicated: Secondary | ICD-10-CM

## 2016-01-13 LAB — CBC
HCT: 33.1 % — ABNORMAL LOW (ref 36.0–46.0)
Hemoglobin: 10.4 g/dL — ABNORMAL LOW (ref 12.0–15.0)
MCH: 27.5 pg (ref 26.0–34.0)
MCHC: 31.4 g/dL (ref 30.0–36.0)
MCV: 87.6 fL (ref 78.0–100.0)
Platelets: 230 10*3/uL (ref 150–400)
RBC: 3.78 MIL/uL — ABNORMAL LOW (ref 3.87–5.11)
RDW: 14.2 % (ref 11.5–15.5)
WBC: 5.4 10*3/uL (ref 4.0–10.5)

## 2016-01-13 LAB — BASIC METABOLIC PANEL
Anion gap: 9 (ref 5–15)
BUN: 23 mg/dL — ABNORMAL HIGH (ref 6–20)
CALCIUM: 8.9 mg/dL (ref 8.9–10.3)
CO2: 27 mmol/L (ref 22–32)
CREATININE: 0.72 mg/dL (ref 0.44–1.00)
Chloride: 105 mmol/L (ref 101–111)
Glucose, Bld: 118 mg/dL — ABNORMAL HIGH (ref 65–99)
Potassium: 3.7 mmol/L (ref 3.5–5.1)
SODIUM: 141 mmol/L (ref 135–145)

## 2016-01-13 LAB — MAGNESIUM: MAGNESIUM: 2 mg/dL (ref 1.7–2.4)

## 2016-01-13 MED ORDER — FENTANYL CITRATE (PF) 100 MCG/2ML IJ SOLN: 25.0000 ug | INTRAMUSCULAR | Status: DC | PRN

## 2016-01-13 MED ORDER — CHLORHEXIDINE GLUCONATE 0.12 % MT SOLN
15.0000 mL | Freq: Two times a day (BID) | OROMUCOSAL | Status: DC
  Administered 2016-01-13 – 2016-01-16 (×6): 15 mL via OROMUCOSAL
  Filled 2016-01-13 (×7): qty 15

## 2016-01-13 MED ORDER — ASPIRIN 81 MG PO CHEW
81.0000 mg | CHEWABLE_TABLET | Freq: Every day | ORAL | Status: DC
  Administered 2016-01-13 – 2016-01-16 (×4): 81 mg via ORAL
  Filled 2016-01-13 (×4): qty 1

## 2016-01-13 MED ORDER — METHOCARBAMOL 500 MG PO TABS
750.0000 mg | ORAL_TABLET | Freq: Three times a day (TID) | ORAL | Status: AC
  Administered 2016-01-13 – 2016-01-14 (×6): 750 mg via ORAL
  Filled 2016-01-13 (×6): qty 2

## 2016-01-13 MED ORDER — FAMOTIDINE IN NACL 20-0.9 MG/50ML-% IV SOLN
20.0000 mg | Freq: Two times a day (BID) | INTRAVENOUS | Status: DC
  Administered 2016-01-13 – 2016-01-14 (×2): 20 mg via INTRAVENOUS
  Filled 2016-01-13 (×4): qty 50

## 2016-01-13 MED ORDER — OXYCODONE HCL 5 MG/5ML PO SOLN
5.0000 mg | Freq: Three times a day (TID) | ORAL | Status: DC
  Administered 2016-01-13 – 2016-01-16 (×11): 5 mg via ORAL
  Filled 2016-01-13 (×12): qty 5

## 2016-01-13 MED ORDER — ORAL CARE MOUTH RINSE
15.0000 mL | Freq: Two times a day (BID) | OROMUCOSAL | Status: DC
  Administered 2016-01-13 – 2016-01-15 (×2): 15 mL via OROMUCOSAL

## 2016-01-13 NOTE — Care Management Important Message (Signed)
Important Message  Patient Details  Name: Judy Weiss MRN: [id number] Date of Birth: 1945/05/05   Medicare Important Message Given:  Yes    Haskell FlirtJamison, Jerzy Crotteau 01/13/2016, 3:52 PMImportant Message  Patient Details  Name: Judy Weiss MRN: [id number] Date of Birth: 1945/05/05   Medicare Important Message Given:  Yes    Haskell FlirtJamison, Ranon Coven 01/13/2016, 3:52 PM

## 2016-01-13 NOTE — Progress Notes (Signed)
4 Days Post-Op  Subjective: Pt feeling a little better from oxygen standpoint.   Didn't like the spirometer and threw it away. Still having Left costal pain.  Objective: Vital signs in last 24 hours: Temp:  [97.9 F (36.6 C)-99 F (37.2 C)] 98.1 F (36.7 C) (09/26 0800) Pulse Rate:  [87] 87 (09/26 0100) Resp:  [15-26] 17 (09/26 0339) BP: (90-151)/(55-92) 127/71 (09/26 0339) SpO2:  [74 %-100 %] 94 % (09/26 0339) Last BM Date: 01/11/16  Intake/Output from previous day: 09/25 0701 - 09/26 0700 In: 500 [P.O.:120; I.V.:230; IV Piggyback:150] Out: 425 [Urine:300; Drains:125] Intake/Output this shift: Total I/O In: -  Out: 80 [Drains:80]  General appearance: alert and cooperative GI: soft, non-tender; bowel sounds normal; no masses,  no organomegaly and incision c/d/i , JP SS  Lab Results:   Recent Labs  01/12/16 0326 01/13/16 0414  WBC 7.7 5.4  HGB 10.2* 10.4*  HCT 33.0* 33.1*  PLT 198 230   BMET  Recent Labs  01/12/16 0326 01/13/16 0414  NA 140 141  K 3.9 3.7  CL 107 105  CO2 25 27  GLUCOSE 129* 118*  BUN 21* 23*  CREATININE 0.79 0.72  CALCIUM 8.9 8.9    Studies/Results: Dg Chest Port 1 View  Result Date: 01/12/2016 CLINICAL DATA:  Pneumothorax. EXAM: PORTABLE CHEST 1 VIEW COMPARISON:  01/11/2016.  CT 01/10/2016. FINDINGS: Stable cardiomegaly. Stable atelectasis right upper lobe and both lung bases. No prominent pleural effusion. No pneumothorax noted on today's exam. Surgical tubing noted over the left upper abdomen/lower chest. IMPRESSION: 1.  Interval resolution of left-sided pneumothorax. 2.  Stable cardiomegaly. 3. Persistent prominent atelectatic changes right upper lobe and both lung bases. Electronically Signed   By: Maisie Fushomas  Register   On: 01/12/2016 06:33    Anti-infectives: Anti-infectives    Start     Dose/Rate Route Frequency Ordered Stop   01/11/16 1000  piperacillin-tazobactam (ZOSYN) IVPB 3.375 g     3.375 g 12.5 mL/hr over 240 Minutes  Intravenous Every 8 hours 01/11/16 0852     01/09/16 1012  ceFAZolin (ANCEF) IVPB 2g/100 mL premix     2 g 200 mL/hr over 30 Minutes Intravenous On call to O.R. 01/09/16 1012 01/09/16 1214      Assessment/Plan: s/p Procedure(s): XI ROBOTIC HIATAL HERNIA NISSEN FUNDOPLICATION GASTROPEXY (N/A) INSERTION OF MESH (N/A) Con't dys diet Mobilize Encourage IS All pills must be crushed Hopefully to floor later today. PT  LOS: 4 days    Judy Ehlersamirez Jr., [facility] 01/13/2016

## 2016-01-13 NOTE — Evaluation (Signed)
Clinical/Bedside Swallow Evaluation Patient Details  Name: Judy Weiss MRN: [id number] Date of Birth: [birth year]  Today's Date: 01/13/2016 Time: SLP Start Time (ACUTE ONLY): 1050 SLP Stop Time (ACUTE ONLY): 1125 SLP Time Calculation (min) (ACUTE ONLY): 35 min  Past Medical History:  Past Medical History:  Diagnosis Date  . Allergy    food allergies and some additives. But she does not know what will cause.  . Depression   . GERD (gastroesophageal reflux disease)    had in past but now controlled. Has hiatal hernia.  Marland Kitchen Heart murmur   . History of blood clots 1996?   left leg  . History of colon polyps   . History of hiatal hernia   . Hyperlipidemia    Past Surgical History:  Past Surgical History:  Procedure Laterality Date  . APPENDECTOMY  1972  . DILATION AND CURETTAGE OF UTERUS    . INSERTION OF MESH N/A 01/09/2016   Procedure: INSERTION OF MESH;  Surgeon: Axel Filler, MD;  Location: [facility];  Service: General;  Laterality: N/A;  . KNEE SURGERY Bilateral    hx of torn meniscus (arthroscopic surgery)  . LEG SURGERY Left 2001   Had veins stripped   . SHOULDER SURGERY Right    reports hx arthroscopic surgery   HPI:  70 yo female adm to [facility] with Paraesophageal hiatal hernia s/p robotic reduction/repair with mesh 01/09/2016.  PMH + for GERD, pneumothorax, depression, hypoxia.  Swallow evaluation ordered.  Family reports pt with delayed processing but mentation is improving - not back to baseline.  Family also reports pt with frequent belching during intake which is not her norm.     Assessment / Plan / Recommendation Clinical Impression  CN exam unremarkable and pt able to self feed.  SLP observed pt consuming yogurt, applesauce, coffee, ice and water.  Oral transiting functional without residuals. No indication of airway compromise or pharyngeal residuals and swallow appeared timely.  Pt did indicate sensation of resdiuals at pharynx, which SlP suspects is referrant from  known esophageal dysmotiltily and narrowing.  Educated pt to dyspahgia mitigation strategies including starting meals with liquids to compensate for xerostomia.  Consuming liquids during meals and eating multiple small meals recommended.  Pt states she regurgitates with water if consumed during meals.  Suspect primary esophageal issues may be a risk factor for aspiration.      Aspiration Risk  Mild aspiration risk    Diet Recommendation Dysphagia 1 (Puree);Thin liquid   Liquid Administration via: Cup;No straw Medication Administration: Crushed with puree Supervision: Patient able to self feed Compensations: Slow rate;Small sips/bites;Other (Comment) (start intake with water, drink liquids t/o meal) Postural Changes: Seated upright at 90 degrees;Remain upright for at least 30 minutes after po intake    Other  Recommendations Oral Care Recommendations: Oral care BID   Follow up Recommendations None      Frequency and Duration            Prognosis        Swallow Study   General Date of Onset: 01/13/16 HPI: 70 yo female adm to [facility] with Paraesophageal hiatal hernia s/p robotic reduction/repair with mesh 01/09/2016.  PMH + for GERD, pneumothorax, depression, hypoxia.  Swallow evaluation ordered.  Family reports pt with delayed processing but mentation is improving - not back to baseline.  Family also reports pt with frequent belching during intake which is not her norm.   Type of Study: Bedside Swallow Evaluation Diet Prior to this Study: Dysphagia 1 (puree);Thin  liquids Temperature Spikes Noted: No Respiratory Status: Nasal cannula Behavior/Cognition: Alert;Cooperative;Pleasant mood Oral Cavity Assessment: Within Functional Limits Oral Care Completed by SLP: No Oral Cavity - Dentition: Adequate natural dentition Vision: Functional for self-feeding Self-Feeding Abilities: Able to feed self Patient Positioning: Upright in bed Baseline Vocal Quality: Low vocal intensity (pt reports  voice has been "coming and going" for one  year, gradual onset without worsening) Volitional Cough: Other (Comment) (DNT due to pt discomfort from surgery) Volitional Swallow: Able to elicit (after moistened with ice)    Oral/Motor/Sensory Function Overall Oral Motor/Sensory Function: Within functional limits   Ice Chips Ice chips: Within functional limits Presentation: Self Fed;Spoon   Thin Liquid Thin Liquid: Within functional limits Presentation: Cup;Self Fed    Nectar Thick Nectar Thick Liquid: Not tested   Honey Thick Honey Thick Liquid: Not tested   Puree Puree: Within functional limits Presentation: Self Fed;Spoon   Solid   GO   Solid: Not tested Other Comments: surgeon has pt on a pureed diet        Mills KollerKimball, Azlaan Isidore Ann Mikenzi Raysor, MS [facility] SLP [phone]

## 2016-01-13 NOTE — Progress Notes (Signed)
[facility], pt to transfer to [id number], family aware

## 2016-01-13 NOTE — Progress Notes (Signed)
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  PTX resolved.  SQ crepitus resolved.  Doubt nec fasciitis  -Transfer to floor -KVO IVF with PRN IVF backup -pulmonary toilet -Wean oxygen - on [place] -Decadron x 72 hr for NIssen fundo edema & hypoxia. ?Asp pneumonitis - no emesis event. -Zosyn x 5 d for now. ?Asp event  -no evid pancreatitis - pancreas reduced from giant hiatal hernia  -pain control - reduce IV fentanyl frequency since sleeping,  Try scheduled oxy TID & then PRN - scheduled robaxin/naproxen.  PRN tylenol/ice/heat.    -resume home Wellbutrin for depression  -pureed diet.  SLP eval just in case to r/o aspiration.   -mobilize as tolerated to help  recovery - get in chair.  Get her up  -CCM eval if worse.  No HD compromise / sepsis at this time.  Getting better - hold off for now  -VTE prophylaxis- SCDs, enoxaparin, etc  -D/w pt & ICU RN.  Time = 30 minutes  Adin Hector, M.D., F.A.C.S. Gastrointestinal and Minimally Invasive Surgery [facility] [address] [place], [place] [address] [phone] Main / Paging   01/13/2016  Subjective:  Weaning oxygen Using bedside commode More calm on CIWA protocol  Objective:  Vital signs:  Vitals:   01/12/16 2300 01/13/16 0000 01/13/16 0100 01/13/16 0339  BP: (!) 120/55 (!) 110/59 139/76 127/71  Pulse:   87   Resp: 16 (!) _0 Temp:  99 F (37.2 C)  98.4 F (36.9 C)  TempSrc:  Oral  Oral  SpO2: 98% 100% 97% 94%  Weight:      Height:        Last BM Date: 01/11/16  Intake/Output   Yesterday:  09/25 0701 - 09/26 0700 In: 500 [P.O.:120; I.V.:230; IV Piggyback:150] Out: 425 [Urine:300; Drains:125] This shift:  No intake/output data recorded.  Bowel function:  Flatus: YES  BM:  YES  Drain: Serosanguinous - thin.  Drain amylase 15 (serum 21)   Physical Exam:  General: Pt more alert in No acute distress Eyes: PERRL, normal EOM.  Sclera clear.  No icterus Neuro: CN II-XII intact w/o focal sensory/motor deficits. Lymph: No head/neck/groin lymphadenopathy Psych:  No delerium/psychosis/paranoia.   HENT: Normocephalic, Mucus membranes moist.  No thrush Neck: Supple, No tracheal deviation Chest: No chest  wall pain & fair excursion.  BS dec at bases.  SQ crepitus gone. No cellulitis, erythema, warmth CV:  Pulses intact.  Regular rhythm MS: Normal AROM mjr joints.  No obvious deformity Abdomen: Soft. Obese.  Nondistended.  Tenderness at LUQ & drain site only.  No evidence of peritonitis.  No incarcerated hernias. Ext:  SCDs BLE.  No mjr edema.  No cyanosis Skin: No petechiae / purpura  Results:   Labs: Results for  orders placed or performed during the hospital encounter of 01/09/16 (from the past 48 hour(s))  CBC     Status: Abnormal   Collection Time: 01/12/16  3:26 AM  Result Value Ref Range   WBC 7.7 4.0 - 10.5 K/uL   RBC 3.71 (L) 3.87 - 5.11 MIL/uL   Hemoglobin 10.2 (L) 12.0 - 15.0 g/dL   HCT 33.0 (L) 36.0 - 46.0 %   MCV 88.9 78.0 - 100.0 fL   MCH 27.5 26.0 - 34.0 pg   MCHC 30.9 30.0 - 36.0 g/dL   RDW 14.2 11.5 - 15.5 %   Platelets 198 150 - 400 K/uL  Comprehensive metabolic panel     Status: Abnormal   Collection Time: 01/12/16  3:26 AM  Result Value Ref Range   Sodium 140 135 - 145 mmol/L   Potassium 3.9 3.5 - 5.1 mmol/L   Chloride 107 101 - 111 mmol/L   CO2 25 22 - 32 mmol/L   Glucose, Bld 129 (H) 65 - 99 mg/dL   BUN 21 (H) 6 - 20 mg/dL   Creatinine, Ser 0.79 0.44 - 1.00 mg/dL   Calcium 8.9 8.9 - 10.3 mg/dL   Total Protein 7.0 6.5 - 8.1 g/dL   Albumin 3.7 3.5 - 5.0 g/dL   AST 117 (H) 15 - 41 U/L   ALT 193 (H) 14 - 54 U/L   Alkaline Phosphatase 51 38 - 126 U/L   Total Bilirubin 1.0 0.3 - 1.2 mg/dL   GFR calc non Af Amer >60 >60 mL/min   GFR calc Af Amer >60 >60 mL/min    Comment: (NOTE) The eGFR has been calculated using the CKD EPI equation. This calculation has not been validated in all clinical situations. eGFR's persistently <60 mL/min signify possible Chronic Kidney Disease.    Anion gap 8 5 - 15  Lipase, blood     Status: None   Collection Time: 01/12/16  3:26 AM  Result Value Ref Range   Lipase 17 11 - 51 U/L  CBC     Status: Abnormal   Collection Time: 01/13/16  4:14 AM  Result Value Ref Range   WBC 5.4 4.0 - 10.5 K/uL   RBC 3.78 (L) 3.87 - 5.11 MIL/uL   Hemoglobin 10.4 (L) 12.0 - 15.0 g/dL   HCT 33.1 (L) 36.0 - 46.0 %   MCV 87.6 78.0 - 100.0 fL   MCH 27.5 26.0 - 34.0 pg   MCHC 31.4 30.0 - 36.0 g/dL   RDW 14.2 11.5 - 15.5 %   Platelets 230 150 - 400 K/uL  Basic metabolic panel     Status: Abnormal   Collection Time: 01/13/16  4:14 AM  Result Value Ref  Range   Sodium 141 135 - 145 mmol/L   Potassium 3.7 3.5 - 5.1 mmol/L   Chloride 105 101 - 111 mmol/L   CO2 27 22 - 32 mmol/L   Glucose, Bld 118 (H) 65 - 99 mg/dL   BUN 23 (H) 6 - 20 mg/dL  Creatinine, Ser 0.72 0.44 - 1.00 mg/dL   Calcium 8.9 8.9 - 10.3 mg/dL   GFR calc non Af Amer >60 >60 mL/min   GFR calc Af Amer >60 >60 mL/min    Comment: (NOTE) The eGFR has been calculated using the CKD EPI equation. This calculation has not been validated in all clinical situations. eGFR's persistently <60 mL/min signify possible Chronic Kidney Disease.    Anion gap 9 5 - 15  Magnesium     Status: None   Collection Time: 01/13/16  4:14 AM  Result Value Ref Range   Magnesium 2.0 1.7 - 2.4 mg/dL    Imaging / Studies: Dg Chest Port 1 View  Result Date: 01/12/2016 CLINICAL DATA:  Pneumothorax. EXAM: PORTABLE CHEST 1 VIEW COMPARISON:  01/11/2016.  CT 01/10/2016. FINDINGS: Stable cardiomegaly. Stable atelectasis right upper lobe and both lung bases. No prominent pleural effusion. No pneumothorax noted on today's exam. Surgical tubing noted over the left upper abdomen/lower chest. IMPRESSION: 1.  Interval resolution of left-sided pneumothorax. 2.  Stable cardiomegaly. 3. Persistent prominent atelectatic changes right upper lobe and both lung bases. Electronically Signed   By: Marcello Moores  Register   On: 01/12/2016 06:33    Medications / Allergies: per chart  Antibiotics: Anti-infectives    Start     Dose/Rate Route Frequency Ordered Stop   01/11/16 1000  piperacillin-tazobactam (ZOSYN) IVPB 3.375 g     3.375 g 12.5 mL/hr over 240 Minutes Intravenous Every 8 hours 01/11/16 0852     01/09/16 1012  ceFAZolin (ANCEF) IVPB 2g/100 mL premix     2 g 200 mL/hr over 30 Minutes Intravenous On call to O.R. 01/09/16 1012 01/09/16 1214        Note: Portions of this report may have been transcribed using voice recognition software. Every effort was made to ensure accuracy; however, inadvertent computerized  transcription errors may be present.   Any transcriptional errors that result from this process are unintentional.     Adin Hector, M.D., F.A.C.S. Gastrointestinal and Minimally Invasive Surgery [facility] [address] [place], [place] [address] [phone] Main / Paging   01/13/2016

## 2016-01-13 NOTE — Progress Notes (Signed)
NUTRITION NOTE   Pt seen for consult for: s/p gastric fundoplication & will need pureed diet 2-3 weeks post-op until wrap edema resolves.  Pt is currently on Dysphagia 1, thin liquids diet with no intakes documented. Pt was seen by SLP earlier today and no findings of aspiration were found at that time. Pt is currently POD #4 Nissen fundoplication. Pt with poor understanding of current diet order and the reasoning behind it. Explained procedure briefly to pt as it relates to diet texture abilities. Also provided pt with handouts from The Nutrition Care Manual that outline a Dysphagia 1 diet, recommended foods, foods to avoid, and tips for shopping and preparing foods on this diet order. Pt reports that she lives alone and will be preparing her own foods.   Pt expresses frustration with current diet order and not being able to order items from the menu available in her room. Took pt's order for dinner (pureed chicken, pureed green beans, chocolate pudding, strawberry yogurt, hot tea). Talked with pt about the need to order and consume items that are very smooth in texture and free of all lumps/chunks. SLP note indicates recommendation for liquids with meals and that pt had stated that she "regurgitates with water if consumed during meals." Encouraged pt that once she gets home, if she is able to swallow well without liquids that liquids should be separated from meals in an effort to decrease volume load on edematous wrap.   Encouraged pt to review handouts at her leisure and to alert RN if she has questions so that RD can provide further guidance. Expect fair compliance based on interaction with pt and foods pt requested even after explanation of appropriate foods was provided.  Medications and labs reviewed. Current BMI: 33.8 kg/m2 which indicates obesity. Pt with abdominal surgical wound from procedure 01/09/16.    Trenton GammonJessica Anshul Meddings, MS, RD, LDN Inpatient Clinical Dietitian Pager # [phone] After  hours/weekend pager # [phone]

## 2016-01-13 NOTE — Evaluation (Signed)
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 Pt currently with functional limitations due to the deficits listed below (see PT Problem List).  Pt will benefit from skilled PT to increase their independence and safety with mobility to allow discharge to the venue listed below.    Pt should progress well, recommend she amb with nursing staff as well; will follow    Follow Up Recommendations No PT follow up    Equipment Recommendations  None recommended by PT    Recommendations for Other Services       Precautions / Restrictions Precautions Precautions: Fall      Mobility  Bed Mobility               General bed mobility comments: pt in chair  Transfers Overall transfer level: Needs assistance Equipment used: Rolling walker (2 wheeled) Transfers: Sit to/from Stand Sit to Stand: Min assist         General transfer comment: cues for hand placement and control of descent  Ambulation/Gait Ambulation/Gait assistance: Min assist   Assistive device: Rolling walker (2 wheeled) Gait Pattern/deviations: Step-through pattern     General Gait Details: assistance to steady during gait, to maneuver RW, cues for posture and breathing  Stairs            Wheelchair Mobility    Modified Rankin (Stroke Patients Only)       Balance Overall balance assessment: Needs assistance Sitting-balance support: No upper extremity supported Sitting balance-Leahy Scale: Fair       Standing balance-Leahy Scale: Poor Standing balance comment: pt reliant on unilateral UE at this time                             Pertinent Vitals/Pain Pain Assessment: Faces Faces Pain Scale: Hurts little more Pain  Location: abd Pain Descriptors / Indicators: Sore Pain Intervention(s): Limited activity within patient's tolerance;Monitored during session    Home Living Family/patient expects to be discharged to:: Private residence Living Arrangements: Alone             Home Equipment: None Additional Comments: 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    Prior Function Level of Independence: Independent               Hand Dominance        Extremity/Trunk Assessment   Upper Extremity Assessment: Defer to OT evaluation           Lower Extremity Assessment: Overall WFL for tasks assessed         Communication   Communication: No difficulties  Cognition Arousal/Alertness: Awake/alert Behavior During Therapy: WFL for tasks assessed/performed Overall Cognitive Status: Within Functional Limits for tasks assessed                      General Comments      Exercises     Assessment/Plan    PT Assessment Patient needs continued PT services  PT Problem List Decreased strength;Decreased activity tolerance;Decreased mobility;Decreased knowledge of use of DME          PT Treatment Interventions DME instruction;Gait training;Functional mobility training;Therapeutic activities;Therapeutic exercise;Patient/family education    PT Goals (Current goals can be found in the Care Plan section)  Acute Rehab  PT Goals Patient Stated Goal: none stated PT Goal Formulation: With patient Time For Goal Achievement: 01/20/16 Potential to Achieve Goals: Good    Frequency Min 3X/week   Barriers to discharge        Co-evaluation               End of Session Equipment Utilized During Treatment: Gait belt Activity Tolerance: Patient tolerated treatment well Patient left: with call bell/phone within reach;in chair           Time: [id number] PT Time Calculation (min) (ACUTE ONLY): 17 min   Charges:   PT Evaluation $PT Eval Low Complexity: 1 Procedure     PT G Codes:         Yoko Mcgahee 01/13/2016, 1:32 PM

## 2016-01-14 LAB — BASIC METABOLIC PANEL
ANION GAP: 8 (ref 5–15)
BUN: 26 mg/dL — ABNORMAL HIGH (ref 6–20)
CO2: 29 mmol/L (ref 22–32)
Calcium: 8.7 mg/dL — ABNORMAL LOW (ref 8.9–10.3)
Chloride: 104 mmol/L (ref 101–111)
Creatinine, Ser: 0.72 mg/dL (ref 0.44–1.00)
GLUCOSE: 111 mg/dL — AB (ref 65–99)
POTASSIUM: 3.8 mmol/L (ref 3.5–5.1)
Sodium: 141 mmol/L (ref 135–145)

## 2016-01-14 LAB — CBC
HCT: 33.1 % — ABNORMAL LOW (ref 36.0–46.0)
Hemoglobin: 10.4 g/dL — ABNORMAL LOW (ref 12.0–15.0)
MCH: 27.3 pg (ref 26.0–34.0)
MCHC: 31.4 g/dL (ref 30.0–36.0)
MCV: 86.9 fL (ref 78.0–100.0)
PLATELETS: 248 10*3/uL (ref 150–400)
RBC: 3.81 MIL/uL — AB (ref 3.87–5.11)
RDW: 14.1 % (ref 11.5–15.5)
WBC: 5 10*3/uL (ref 4.0–10.5)

## 2016-01-14 MED ORDER — FAMOTIDINE 20 MG PO TABS
20.0000 mg | ORAL_TABLET | Freq: Two times a day (BID) | ORAL | Status: DC
  Administered 2016-01-14 – 2016-01-16 (×4): 20 mg via ORAL
  Filled 2016-01-14 (×4): qty 1

## 2016-01-14 NOTE — Progress Notes (Signed)
Pharmacy Antibiotic Note  Judy Weiss is a 70 y.o. female admitted on 01/09/2016 with possible aspiration PNA.  Pharmacy has been consulted for Zosyn dosing.  Plan: Continue current Zosyn dosing. Patient to complete Abx's after tomorrow's doses - total 5 days  Height: 5\' 6"  (167.6 cm) Weight: 208 lb 12.4 oz (94.7 kg) IBW/kg (Calculated) : 59.3  Temp (24hrs), Avg:98.2 F (36.8 C), Min:97.6 F (36.4 C), Max:99.1 F (37.3 C)   Recent Labs Lab 01/10/16 0444 01/10/16 1501 01/11/16 0509 01/12/16 0326 01/13/16 0414 01/14/16 0608  WBC  --  11.8* 8.1 7.7 5.4 5.0  CREATININE 0.84  --  0.93 0.79 0.72 0.72    Estimated Creatinine Clearance: 73.8 mL/min (by C-G formula based on SCr of 0.72 mg/dL).    No Known Allergies  Antimicrobials this admission: 9/24 Zosyn >>   Dose adjustments this admission:  Microbiology results: 9/23 MRSA PCR: negative  Thank you for allowing pharmacy to be a part of this patient's care.   Hessie KnowsJustin M Deneice Wack, PharmD, BCPS Pager [phone] 01/14/2016 10:43 AM

## 2016-01-14 NOTE — Progress Notes (Signed)
Key Points: Use following P&T approved IV to PO non-antibiotic change policy.  Description contains the criteria that are approved Note: Policy Excludes:  Esophagectomy patientsPHARMACIST - PHYSICIAN COMMUNICATION DR:   Derrell Lollingamirez CONCERNING: IV to Oral Route Change Policy  RECOMMENDATION: This patient is receiving pepcid by the intravenous route.  Based on criteria approved by the Pharmacy and Therapeutics Committee, the intravenous medication(s) is/are being converted to the equivalent oral dose form(s).   DESCRIPTION: These criteria include:  The patient is eating (either orally or via tube) and/or has been taking other orally administered medications for a least 24 hours  The patient has no evidence of active gastrointestinal bleeding or impaired GI absorption (gastrectomy, short bowel, patient on TNA or NPO).  If you have questions about this conversion, please contact the Pharmacy Department  []   [phone]( [phone] )  [facility] []   [phone]( [phone] )  [facility]  []   [phone]( [phone] )  [facility] [x]   [phone]( [phone] )  [facility]  Earl ManyLegge, Lyliana Dicenso [place], [facility] 01/14/2016 10:44 AM

## 2016-01-14 NOTE — Evaluation (Signed)
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  Will follow in acute setting with mod I to supervision level goals.  Pt is overall supervision to min guard. She is very active and independent at baseline    Follow Up Recommendations  Supervision - Intermittent    Equipment Recommendations   (possibly shower seat)    Recommendations for Other Services       Precautions / Restrictions Precautions Precautions: Fall Restrictions Weight Bearing Restrictions: No      Mobility Bed Mobility               General bed mobility comments: oob  Transfers   Equipment used: Rolling walker (2 wheeled) Transfers: Sit to/from Stand Sit to Stand: Min guard         General transfer comment: for safety    Balance                                            ADL Overall ADL's : Needs assistance/impaired     Grooming: Oral care;Supervision/safety;Standing   Upper Body Bathing: Set up;Sitting   Lower Body Bathing: Set up;Supervison/ safety;Sit to/from stand   Upper Body Dressing : Set up;Sitting   Lower Body Dressing: Set up;Supervision/safety;Sit to/from stand   Toilet Transfer: Min guard;Ambulation;BSC   Toileting- ArchitectClothing Manipulation and Hygiene: Supervision/safety;Sit to/from stand   Tub/ Shower Transfer: Tub transfer;Min guard;Ambulation (with gra bar )     General ADL Comments: pt is able to cross legs for ADLs; has intermittent pain with L knee.  Educated on energy conservation.  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 Advertising copywritersmall shower seat     Vision     Perception     Praxis      Pertinent Vitals/Pain Pain Assessment: Faces Faces Pain Scale:  Hurts even more Pain Descriptors / Indicators: Sore Pain Intervention(s): Limited activity within patient's tolerance;Monitored during session;Repositioned     Hand Dominance     Extremity/Trunk Assessment Upper Extremity Assessment Upper Extremity Assessment: Overall WFL for tasks assessed           Communication Communication Communication: HOH   Cognition Arousal/Alertness: Awake/alert Behavior During Therapy: WFL for tasks assessed/performed Overall Cognitive Status: Within Functional Limits for tasks assessed (extra time to answer at times: hoh)                     General Comments       Exercises       Shoulder Instructions      Home Living Family/patient expects to be discharged to:: Private residence Living Arrangements: Alone                 Bathroom Shower/Tub: Tub/shower unit Shower/tub characteristics: Curtain FirefighterBathroom Toilet: Handicapped height     Home Equipment: Grab bars - tub/shower   Additional Comments: 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      Prior Functioning/Environment Level of Independence: Independent                 OT Problem List: Decreased strength;Decreased activity tolerance;Pain;Decreased knowledge of use of DME or AE  OT Treatment/Interventions: Self-care/ADL training;DME and/or AE instruction;Patient/family education;Balance training;Therapeutic activities    OT Goals(Current goals can be found in the care plan section) Acute Rehab OT Goals Patient Stated Goal: get back to active lifestyle OT Goal Formulation: With patient Time For Goal Achievement: 01/21/16 Potential to Achieve Goals: Good ADL Goals Pt Will Transfer to Toilet: with modified independence;ambulating (comfort height commode) Pt Will Perform Toileting - Clothing Manipulation and hygiene: with modified independence;sit to/from stand Pt Will Perform Tub/Shower Transfer: Tub transfer;ambulating;shower seat;with supervision Additional ADL Goal #1: pt will  gather clothes with RW at mod I level  OT Frequency: Min 2X/week   Barriers to D/C:            Co-evaluation              End of Session    Activity Tolerance: Patient tolerated treatment well Patient left: in chair;with call bell/phone within reach;with family/visitor present   Time: 1610-9604 OT Time Calculation (min): 30 min Charges:   EV and [place] G-Codes:    Hildy Nicholl 02/02/16, 3:45 PM  [name], OTR/L [phone] February 02, 2016

## 2016-01-14 NOTE — Progress Notes (Signed)
OT Cancellation Note  Patient Details Name: Judy ConnersShelia Weiss MRN: [id number] DOB: 11-01-1945   Cancelled Treatment:    Reason Eval/Treat Not Completed: Other (comment).  Pt has pain and requests that I come back later. Will check back.  Sherrian Nunnelley 01/14/2016, 10:23 AM  Marica OtterMaryellen Shalisa Mcquade, OTR/L [phone] 01/14/2016

## 2016-01-14 NOTE — Progress Notes (Signed)
5 Days Post-Op  Subjective: Pt doing well. Still with some L subcostal pain with IS tol PO ambulating  Objective: Vital signs in last 24 hours: Temp:  [97.6 F (36.4 C)-99.1 F (37.3 C)] 97.6 F (36.4 C) (09/27 0431) Pulse Rate:  [71-83] 74 (09/27 0431) Resp:  [20-24] 20 (09/27 0431) BP: (106-144)/(65-87) 133/75 (09/27 0431) SpO2:  [92 %-99 %] 92 % (09/27 0431) Last BM Date: 01/11/16  Intake/Output from previous day: 09/26 0701 - 09/27 0700 In: 440 [I.V.:240; IV Piggyback:200] Out: 480 [Urine:200; Drains:280] Intake/Output this shift: No intake/output data recorded.  General appearance: alert and cooperative GI: soft, incision c/d/i, JP SS  Lab Results:   Recent Labs  01/13/16 0414 01/14/16 0608  WBC 5.4 5.0  HGB 10.4* 10.4*  HCT 33.1* 33.1*  PLT 230 248   BMET  Recent Labs  01/13/16 0414 01/14/16 0608  NA 141 141  K 3.7 3.8  CL 105 104  CO2 27 29  GLUCOSE 118* 111*  BUN 23* 26*  CREATININE 0.72 0.72  CALCIUM 8.9 8.7*   Anti-infectives: Anti-infectives    Start     Dose/Rate Route Frequency Ordered Stop   01/11/16 1000  piperacillin-tazobactam (ZOSYN) IVPB 3.375 g     3.375 g 12.5 mL/hr over 240 Minutes Intravenous Every 8 hours 01/11/16 0852 01/16/16 0959   01/09/16 1012  ceFAZolin (ANCEF) IVPB 2g/100 mL premix     2 g 200 mL/hr over 30 Minutes Intravenous On call to O.R. 01/09/16 1012 01/09/16 1214      Assessment/Plan: s/p Procedure(s): XI ROBOTIC HIATAL HERNIA NISSEN FUNDOPLICATION GASTROPEXY (N/A) INSERTION OF MESH (N/A) Con't PO Moblize Encourage IS 10x/hr Pt is making slow progress.  Would expect home later this week if con't to improve.  May req home with family to help with ambulation/daily activities.  LOS: 5 days    Marigene EhlersRamirez Jr., [facility] 01/14/2016

## 2016-01-15 NOTE — Progress Notes (Signed)
6 Days Post-Op  Subjective: Pt doing well Tol PO Ambulating  Objective: Vital signs in last 24 hours: Temp:  [98 F (36.7 C)-98.6 F (37 C)] 98.5 F (36.9 C) (09/28 1200) Pulse Rate:  [66-72] 66 (09/28 1200) Resp:  [18-20] 20 (09/28 1200) BP: (102-155)/(63-92) 104/66 (09/28 1200) SpO2:  [92 %-97 %] 95 % (09/28 1200) Last BM Date: 01/14/16  Intake/Output from previous day: 09/27 0701 - 09/28 0700 In: 200 [I.V.:100; IV Piggyback:100] Out: 285 [Drains:285] Intake/Output this shift: Total I/O In: 240 [P.O.:240] Out: 30 [Drains:30]  General appearance: alert and cooperative GI: soft, non-tender; bowel sounds normal; no masses,  no organomegaly incision c/d/i, JP serous   Lab Results:   Recent Labs  01/13/16 0414 01/14/16 0608  WBC 5.4 5.0  HGB 10.4* 10.4*  HCT 33.1* 33.1*  PLT 230 248   BMET  Recent Labs  01/13/16 0414 01/14/16 0608  NA 141 141  K 3.7 3.8  CL 105 104  CO2 27 29  GLUCOSE 118* 111*  BUN 23* 26*  CREATININE 0.72 0.72  CALCIUM 8.9 8.7*    Anti-infectives: Anti-infectives    Start     Dose/Rate Route Frequency Ordered Stop   01/11/16 1000  piperacillin-tazobactam (ZOSYN) IVPB 3.375 g     3.375 g 12.5 mL/hr over 240 Minutes Intravenous Every 8 hours 01/11/16 0852 01/16/16 0959   01/09/16 1012  ceFAZolin (ANCEF) IVPB 2g/100 mL premix     2 g 200 mL/hr over 30 Minutes Intravenous On call to O.R. 01/09/16 1012 01/09/16 1214      Assessment/Plan: s/p Procedure(s): XI ROBOTIC HIATAL HERNIA NISSEN FUNDOPLICATION GASTROPEXY (N/A) INSERTION OF MESH (N/A) Cont encouraging ambulation  con't PO Hope for home in next 1-2d. May need rehab vs. Home with family based on PT assessment   LOS: 6 days    Judy EhlersRamirez Jr., [facility] 01/15/2016

## 2016-01-15 NOTE — Progress Notes (Signed)
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  Assisted with amb a greater distance in hallway using RW for safety due to recent ABD surgery.  Assisted to bathroom then back to recliner.  Pt tolerated session well.   Follow Up Recommendations  No PT follow up     Equipment Recommendations  None recommended by PT    Recommendations for Other Services       Precautions / Restrictions Precautions Precautions: Fall Restrictions Weight Bearing Restrictions: No    Mobility  Bed Mobility               General bed mobility comments: oob  Transfers Overall transfer level: Needs assistance Equipment used: Rolling walker (2 wheeled);None Transfers: Sit to/from RaytheonStand;Stand Pivot Transfers Sit to Stand: Supervision;Min guard Stand pivot transfers: Supervision;Min guard       General transfer comment: good safety cognition and use of hands to steady self  Ambulation/Gait Ambulation/Gait assistance: Supervision;Min guard Ambulation Distance (Feet): 145 Feet Assistive device: Rolling walker (2 wheeled) Gait Pattern/deviations: Step-through pattern;Decreased stride length;Trunk flexed Gait velocity: decreased   General Gait Details: increased time and one VC safety with turns.  Use of walker for increased safety after ABD surgery.   Stairs            Wheelchair Mobility    Modified Rankin (Stroke Patients Only)       Balance                                    Cognition Arousal/Alertness: Awake/alert Behavior During Therapy: WFL for tasks assessed/performed Overall Cognitive Status: Within Functional Limits for tasks assessed                      Exercises      General Comments         Pertinent Vitals/Pain Pain Assessment: 0-10 Pain Score: 3  Pain Location: ABD Pain Descriptors / Indicators: Constant;Cramping;Tightness Pain Intervention(s): Monitored during session;Repositioned    Home Living                      Prior Function            PT Goals (current goals can now be found in the care plan section) Progress towards PT goals: Progressing toward goals    Frequency    Min 3X/week      PT Plan Current plan remains appropriate    Co-evaluation             End of Session Equipment Utilized During Treatment: Gait belt Activity Tolerance: Patient tolerated treatment well Patient left: in chair;with call bell/phone within reach;with chair alarm set     Time: 4098-11911113-1142 PT Time Calculation (min) (ACUTE ONLY): 29 min  Charges:  $Gait Training: 8-22 mins $Therapeutic Activity: 8-22 mins                    G Codes:      [name]  PTA [facility]  Acute  Rehab Pager      [phone]

## 2016-01-16 NOTE — Progress Notes (Signed)
Date: January 16, 2016 Discharge orders checked for needs. Needs PT and OT [facility] list given to patient wil discuss with daughter and call the cm back with agency. Left my card with the list for return call. Marcelle Smilinghonda Davis, RN, BSN, [place]   [phone]

## 2016-01-16 NOTE — Progress Notes (Signed)
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 Educated on toilet aide and reinforced energy conservation:  Handouts given  Follow Up Recommendations  Home health OT;Supervision - Intermittent    Equipment Recommendations   (HHOT to assist with tub seat/bench)    Recommendations for Other Services      Precautions / Restrictions Precautions Precautions: Fall Restrictions Weight Bearing Restrictions: No       Mobility Bed Mobility                  Transfers                      Balance                                   ADL                                         General ADL Comments: pt reports that she is having increased discomfort during toilet hygiene and doesn't feel like she is being thorough.  Educated on toilet aide models and prices at the gift shops at [facility] and [facility].  Some medical supply companies also carry them.  Pt was sitting up for a long time in the chair and did not want to get OOB.  Reviewed energy conservation and gave her handouts.  She states that she is feeling comfortable getting to bathroom (with supervision).  Did not have toileting needs at this time.        Vision                     Perception     Praxis      Cognition   Behavior During Therapy: WFL for tasks assessed/performed Overall Cognitive Status: Within Functional Limits for tasks assessed                       Extremity/Trunk Assessment               Exercises     Shoulder Instructions       General Comments      Pertinent Vitals/ Pain       Pain Score: 4  Pain Location: ribs/abdomen Pain Intervention(s): Limited activity within patient's  tolerance;Monitored during session  Home Living Family/patient expects to be discharged to:: Private residence Living Arrangements: Alone                 Bathroom Shower/Tub: Tub/shower unit Shower/tub characteristics: Engineer, building servicesCurtain Bathroom Toilet: Handicapped height     Home Equipment: Grab bars - tub/shower   Additional Comments: 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      Prior Functioning/Environment Level of Independence: Independent            Frequency           Progress Toward Goals  OT Goals(current goals can now be found in the care plan section)  Progress towards OT goals: Progressing toward goals     Plan      Co-evaluation  End of Session     Activity Tolerance Patient limited by fatigue   Patient Left in bed;with call bell/phone within reach   Nurse Communication          Time: 1610-9604 OT Time Calculation (min): 29 min  Charges: OT General Charges $OT Visit: 1 Procedure OT Treatments $Therapeutic Activity: 8-22 mins  Sunaina Ferrando 01/16/2016, 2:00 PM  [name], OTR/L [phone] 01/16/2016

## 2016-01-16 NOTE — Progress Notes (Signed)
Pt left the unit in stable condition to be transported home by her daughter.

## 2016-01-16 NOTE — Progress Notes (Signed)
Discharge instructions given to pt, verbalized understanding. Awaiting family for transportation home. 

## 2016-01-16 NOTE — Progress Notes (Signed)
7 Days Post-Op  Subjective: Pt doing well Ambulating great  Objective: Vital signs in last 24 hours: Temp:  [97.9 F (36.6 C)-98.5 F (36.9 C)] 98.1 F (36.7 C) (09/29 0540) Pulse Rate:  [66-75] 74 (09/29 0540) Resp:  [18-20] 18 (09/29 0540) BP: (103-122)/(66-74) 103/70 (09/29 0540) SpO2:  [94 %-97 %] 94 % (09/29 0540) Last BM Date: 01/15/16  Intake/Output from previous day: 09/28 0701 - 09/29 0700 In: 820 [P.O.:240; I.V.:380; IV Piggyback:200] Out: 410 [Drains:410] Intake/Output this shift: No intake/output data recorded.  General appearance: alert and cooperative GI: soft, non-tender; bowel sounds normal; no masses,  no organomegaly JP serous  Lab Results:   Recent Labs  01/14/16 0608  WBC 5.0  HGB 10.4*  HCT 33.1*  PLT 248   BMET  Recent Labs  01/14/16 0608  NA 141  K 3.8  CL 104  CO2 29  GLUCOSE 111*  BUN 26*  CREATININE 0.72  CALCIUM 8.7*    Anti-infectives: Anti-infectives    Start     Dose/Rate Route Frequency Ordered Stop   01/11/16 1000  piperacillin-tazobactam (ZOSYN) IVPB 3.375 g     3.375 g 12.5 mL/hr over 240 Minutes Intravenous Every 8 hours 01/11/16 0852 01/16/16 0546   01/09/16 1012  ceFAZolin (ANCEF) IVPB 2g/100 mL premix     2 g 200 mL/hr over 30 Minutes Intravenous On call to O.R. 01/09/16 1012 01/09/16 1214      Assessment/Plan: s/p Procedure(s): XI ROBOTIC HIATAL HERNIA NISSEN FUNDOPLICATION GASTROPEXY (N/A) INSERTION OF MESH (N/A) -Plan for DC later today, after lunch -will consult HH PT/OT   LOS: 7 days    Judy Ehlersamirez Jr., [facility] 01/16/2016

## 2016-01-16 NOTE — Progress Notes (Addendum)
Date: January 16, 2016 [facility] and ot[facility]. Notified. 1415 acknowledgement obtained from [facility] rep. Marcelle Smilinghonda Chelbie Jarnagin, RN, BSN, [place]   [phone]

## 2016-01-19 DIAGNOSIS — F329 Major depressive disorder, single episode, unspecified: Secondary | ICD-10-CM | POA: Diagnosis not present

## 2016-01-19 DIAGNOSIS — Z86718 Personal history of other venous thrombosis and embolism: Secondary | ICD-10-CM | POA: Diagnosis not present

## 2016-01-19 DIAGNOSIS — R531 Weakness: Secondary | ICD-10-CM | POA: Diagnosis not present

## 2016-01-19 DIAGNOSIS — K219 Gastro-esophageal reflux disease without esophagitis: Secondary | ICD-10-CM | POA: Diagnosis not present

## 2016-01-19 DIAGNOSIS — K449 Diaphragmatic hernia without obstruction or gangrene: Secondary | ICD-10-CM | POA: Diagnosis not present

## 2016-01-19 DIAGNOSIS — R011 Cardiac murmur, unspecified: Secondary | ICD-10-CM | POA: Diagnosis not present

## 2016-01-19 DIAGNOSIS — E785 Hyperlipidemia, unspecified: Secondary | ICD-10-CM | POA: Diagnosis not present

## 2016-01-19 NOTE — Discharge Summary (Signed)
Physician Discharge Summary  Patient ID: Judy ConnersShelia Weiss MRN: [id number] DOB/AGE: 10-21-1945 70 y.o.  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  First two post op days the pt experieced some chest pain and SOB.  CT PE was ordered which r/o a PE.  She was transferred to the SDU during that time.  She had a esophagogram POD 1 which showed no leak.  She was started on a liquid diet and tolerated that well.  She was started on abx x 5d with the thought that she may have had an aspiration event.    She con't to progress slowly. She was transferred out of the  SDU and the floor.  She was working with PT and her ambulation increased slowly.  She con't to work with her IS and made good improvements. She otherwise had her pain well controlled.  She was deemed stable for DC and Dc'd home.  Consults: None  Significant Diagnostic Studies: esophagogram which showed no leak post op.  CT PE protocol: no PE  Treatments: surgery: as above  Discharge Exam: Blood pressure 112/75, pulse 70, temperature 97.7 F (36.5 C), temperature source Oral, resp. rate 17, height 5\' 6"  (1.676 m), weight 94.7 kg (208 lb 12.4 oz), SpO2 98 %. General appearance: alert and cooperative GI: soft, non-tender; bowel sounds normal; no masses,  no organomegaly  Disposition: 01-Home or Self Care  Discharge Instructions    Diet - low sodium heart healthy    Complete by:  As directed    Increase activity slowly    Complete by:  As directed         Medication List    TAKE these medications   aspirin EC 81 MG tablet Take 1 tablet (81 mg total) by mouth daily. What changed:  how much to take  additional instructions   buPROPion 300 MG 24 hr tablet Commonly known as:  WELLBUTRIN XL Take 1 tablet (300 mg total) by mouth every morning.   citalopram 40 MG tablet Commonly known as:  CELEXA Take 1 tablet (40 mg total) by mouth daily.   HYDROcodone-acetaminophen 7.5-325 mg/15 ml solution Commonly known as:  HYCET Take 10 mLs by mouth 4 (four) times daily as needed for moderate pain.   naproxen sodium 220 MG tablet Commonly known as:  ANAPROX Take 220 mg by mouth 2 (two) times daily as needed (pain).   Vitamin B-12 2500 MCG Subl Place 1 tablet under the tongue daily.   Vitamin D3 2000 units capsule Take 2,000 Units by mouth daily.   zolpidem 10 MG tablet Commonly known as:  AMBIEN Take 1 tablet (10 mg total) by mouth at bedtime as needed. What changed:  how much to take  reasons to take this      Follow-up Information    Lajean Saveramirez Jr., Kendyl Festa, MD. Go in 2 weeks.   Specialty:  General Surgery Why:  For wound re-check Contact information: [address] [place] [address] [phone]           Signed: Marigene EhlersRamirez Jr., [facility] 01/19/2016, 8:47  AM

## 2016-02-18 ENCOUNTER — Telehealth: Payer: Self-pay

## 2016-02-18 NOTE — Telephone Encounter (Signed)
Rx refill for Zolipdem 10mg  was denied. Patient will need to be seen in the office before this medication can be renewed.

## 2016-04-20 NOTE — Telephone Encounter (Signed)
Needs OV priority additional refills pls.

## 2016-04-20 NOTE — Telephone Encounter (Signed)
Patient request a week supply of citalopram (CELEXA) 40 MG tablet until her appointment. Please call patient if rx refilled

## 2016-04-20 NOTE — Telephone Encounter (Signed)
Rx was denied 2 months ago because pt was past due for follow up.  Please advise?

## 2016-04-21 NOTE — Telephone Encounter (Signed)
Patient scheduled for 04/23/16

## 2016-04-23 ENCOUNTER — Other Ambulatory Visit: Payer: Self-pay | Admitting: Family

## 2016-04-23 ENCOUNTER — Ambulatory Visit (INDEPENDENT_AMBULATORY_CARE_PROVIDER_SITE_OTHER): Payer: Medicare Other | Admitting: Family

## 2016-04-23 ENCOUNTER — Encounter: Payer: Self-pay | Admitting: Family

## 2016-04-23 VITALS — BP 135/75 | HR 92 | Temp 97.6°F | Resp 16 | Ht 63.0 in | Wt 191.6 lb

## 2016-04-23 DIAGNOSIS — F329 Major depressive disorder, single episode, unspecified: Secondary | ICD-10-CM | POA: Diagnosis not present

## 2016-04-23 DIAGNOSIS — Z1231 Encounter for screening mammogram for malignant neoplasm of breast: Secondary | ICD-10-CM | POA: Diagnosis not present

## 2016-04-23 DIAGNOSIS — R490 Dysphonia: Secondary | ICD-10-CM

## 2016-04-23 DIAGNOSIS — L989 Disorder of the skin and subcutaneous tissue, unspecified: Secondary | ICD-10-CM

## 2016-04-23 DIAGNOSIS — E785 Hyperlipidemia, unspecified: Secondary | ICD-10-CM

## 2016-04-23 DIAGNOSIS — F32A Depression, unspecified: Secondary | ICD-10-CM

## 2016-04-23 DIAGNOSIS — G4700 Insomnia, unspecified: Secondary | ICD-10-CM

## 2016-04-23 LAB — LIPID PANEL
CHOL/HDL RATIO: 5
Cholesterol: 268 mg/dL — ABNORMAL HIGH (ref 0–200)
HDL: 56.5 mg/dL (ref >39.00)
LDL CALC: 183 mg/dL — AB (ref 0–99)
NONHDL: 211.19
Triglycerides: 142 mg/dL (ref 0.0–149.0)
VLDL: 28.4 mg/dL (ref 0.0–40.0)

## 2016-04-23 MED ORDER — PANTOPRAZOLE SODIUM 40 MG PO TBEC: 40.0000 mg | DELAYED_RELEASE_TABLET | Freq: Every day | ORAL | 1 refills | Status: DC

## 2016-04-23 MED ORDER — CITALOPRAM HYDROBROMIDE 40 MG PO TABS: 40.0000 mg | ORAL_TABLET | Freq: Every day | ORAL | 1 refills | Status: DC

## 2016-04-23 MED ORDER — ZOLPIDEM TARTRATE 10 MG PO TABS: 5.0000 mg | ORAL_TABLET | Freq: Every evening | ORAL | 0 refills | Status: DC | PRN

## 2016-04-23 MED ORDER — CLOTRIMAZOLE-BETAMETHASONE 1-0.05 % EX CREA: 1.0000 "application " | TOPICAL_CREAM | Freq: Two times a day (BID) | CUTANEOUS | 0 refills | Status: DC

## 2016-04-23 MED ORDER — BUPROPION HCL ER (XL) 300 MG PO TB24: 300.0000 mg | ORAL_TABLET | ORAL | 1 refills | Status: DC

## 2016-04-23 NOTE — Patient Instructions (Signed)
Begin lotrisone cream twice daily to the spot on your left thigh. Begin protonix (antacid) once daily to see if this helps your voice hoarseness. Continue ambien. Follow up in 1 month.

## 2016-04-23 NOTE — Progress Notes (Signed)
Pre visit review using our clinic review tool, if applicable. No additional management support is needed unless otherwise documented below in the visit note. 

## 2016-04-23 NOTE — Progress Notes (Signed)
Subjective:    Patient ID: Judy ConnersShelia Weiss, female    DOB: 1945/09/22, 71 y.o.   MRN: [id number]  HPI  Judy Weiss is a 71 yr old female who presents today for follow up.    1) Depression- maintained on wellbutrin. Ran  Out of citaopram 2 weeks ago. Notices a difference since she has been off, wants to restart.    2) GERD- not currently on standing rx. She is s/p nissan fundoplication and is feeling much better since she had her surgery. She is no longer short of breath. Notes that her voice is "raspy" x 1 year.  She does have some sinus   3) Hyperlipidemia- not on statin.  Lab Results  Component Value Date   CHOL 234 (H) 05/02/2015   HDL 52 05/02/2015   LDLCALC 157 (H) 05/02/2015   TRIG 124 05/02/2015   CHOLHDL 4.5 05/02/2015   4) insomnia- needing to take ambien QHS  5) Skin lesion- left anterior thigh.    Review of Systems See HPI  Past Medical History:  Diagnosis Date  . Allergy    food allergies and some additives. But she does not know what will cause.  . Depression   . GERD (gastroesophageal reflux disease)    had in past but now controlled. Has hiatal hernia.  Marland Kitchen. Heart murmur   . History of blood clots 1996?   left leg  . History of colon polyps   . History of hiatal hernia   . Hyperlipidemia      Social History   Social History  . Marital status: Divorced    Spouse name: N/A  . Number of children: N/A  . Years of education: N/A   Occupational History  . Not on file.   Social History Main Topics  . Smoking status: Never Smoker  . Smokeless tobacco: Never Used  . Alcohol use 4.2 oz/week    7 Standard drinks or equivalent per week     Comment: pt  drinks one glass of hard cidar at dinner each day  . Drug use: No  . Sexual activity: No   Other Topics Concern  . 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   Divorced   4 cats and a dog   Enjoys antiquing.  Buys and sells.  Enjoys shopping    Past Surgical History:  Procedure Laterality Date  . APPENDECTOMY  1972  . DILATION AND CURETTAGE OF UTERUS    . INSERTION OF MESH N/A 01/09/2016   Procedure: INSERTION OF MESH;  Surgeon: Axel FillerArmando Ramirez, MD;  Location: [facility];  Service: General;  Laterality: N/A;  . KNEE SURGERY Bilateral    hx of torn meniscus (arthroscopic surgery)  . LEG SURGERY Left 2001   Had veins stripped   . SHOULDER SURGERY Right    reports hx arthroscopic surgery    Family History  Problem Relation Age of Onset  . Diabetes Mother   . Hypothyroidism Mother     No Known Allergies  Current Outpatient Prescriptions on File Prior to Visit  Medication Sig Dispense Refill  . aspirin EC 81 MG tablet Take 1 tablet (81 mg total) by mouth daily. (Patient taking differently: Take 162 mg by  mouth daily. Take 2 tablets by mouth daily.)    . Cholecalciferol (VITAMIN D3) 2000 units capsule Take 2,000 Units by mouth daily.    . Cyanocobalamin (VITAMIN B-12) 2500 MCG SUBL Place 1 tablet under the tongue daily.     No current facility-administered medications on file prior to visit.     BP 135/75 (BP Location: Right Arm, Cuff Size: Large)   Pulse 92   Temp 97.6 F (36.4 C) (Oral)   Resp 16   Ht 5\' 3"  (1.6 m)   Wt 191 lb 9.6 oz (86.9 kg)   SpO2 100% Comment: room air  BMI 33.94 kg/m       Objective:   Physical Exam  Constitutional: She is oriented to person, place, and time. She appears well-developed and well-nourished.  Cardiovascular: Normal rate, regular rhythm and normal heart sounds.   No murmur heard. Pulmonary/Chest: Effort normal and breath sounds normal. No respiratory distress. She has no wheezes.  Neurological: She is alert and oriented to person, place, and time.  Skin: Skin is warm and dry.  Annular lesion noted left anterior thigh    Psychiatric: She has a normal mood and affect. Her behavior is normal. Judgment and thought content normal.          Assessment & Plan:  Skin lesion- appears fungal, trial of lotrisone.  Hoarseness- suspect secondary to gerd, trial of PPI. If not improvement in 1 month plan referral to ENT.  Depression- deteriorated since she ran out of her citalopram. Resume citalopram.   Insomnia- continue ambien, obtain UDS,  Controlled substance contract signed today.   Hyperlipidemia- obtain lipid panel.

## 2016-04-24 ENCOUNTER — Telehealth: Payer: Self-pay | Admitting: Family

## 2016-04-24 NOTE — Telephone Encounter (Signed)
Cholesterol is very high. I would recommend that she continue to work on a low cholesterol diet and begin atorvastatin once daily. Repeat lipid panel in 6 weeks.

## 2016-04-26 ENCOUNTER — Telehealth: Payer: Self-pay | Admitting: Family

## 2016-04-26 ENCOUNTER — Encounter: Payer: Self-pay | Admitting: Family

## 2016-04-26 DIAGNOSIS — Z79899 Other long term (current) drug therapy: Secondary | ICD-10-CM | POA: Diagnosis not present

## 2016-04-26 MED ORDER — ONDANSETRON 4 MG PO TBDP: 4.0000 mg | ORAL_TABLET | Freq: Three times a day (TID) | ORAL | 0 refills | Status: DC | PRN

## 2016-04-26 MED ORDER — ATORVASTATIN CALCIUM 20 MG PO TABS: 20.0000 mg | ORAL_TABLET | Freq: Every day | ORAL | 3 refills | Status: DC

## 2016-04-26 NOTE — Telephone Encounter (Signed)
OK to repeat fasting cholesterol.

## 2016-04-26 NOTE — Telephone Encounter (Signed)
rx sent for zofran. Please advise pt to schedule office visit if symptoms worsen or do not improve with use of prn zofran.

## 2016-04-26 NOTE — Telephone Encounter (Addendum)
Notified pt and she voices understanding. States she was supposed to follow up with PCP in 1month from last visit. Scheduled 6 week f/u with PCP on 06/09/16.  Pt then states that she was not fasting when she had labs completed and wants to know if she can do fasting cholesterol again before starting medication?

## 2016-04-26 NOTE — Telephone Encounter (Signed)
Notified pt and she voices understanding. 

## 2016-04-26 NOTE — Telephone Encounter (Signed)
Relation to JY:NWGNpt:self Call back number:[phone] [facility]acy[id number] [place], [place] - [address] [phone])     Reason for call:  Patient states she had "hernia surgery" therefore patient unable to vomit patient experiencing diarrhea x1 started on Saturday and due to patient not eating no longer experiencing, low grade fever x3, surgeon advised to contact PCP requesting Rx for flu like symptoms, please advise

## 2016-04-26 NOTE — Telephone Encounter (Signed)
Spoke with pt. States she has been very nauseated since Friday. Has had temp of 99. No longer having diarrhea. Pt is requesting medication to help her nausea. Please advise?

## 2016-04-27 ENCOUNTER — Telehealth: Payer: Self-pay

## 2016-04-27 ENCOUNTER — Ambulatory Visit: Payer: Medicare Other | Admitting: Family

## 2016-04-27 MED ORDER — ONDANSETRON 4 MG PO TBDP: 4.0000 mg | ORAL_TABLET | Freq: Three times a day (TID) | ORAL | 0 refills | Status: DC | PRN

## 2016-04-27 MED FILL — ONDANSETRON ODT 4 MG TABLET: 4 | 4 days supply | Qty: 20 | Fill #0

## 2016-04-27 NOTE — Telephone Encounter (Signed)
[place] [employer] called in to give outcome to PA.  denial for medication reason not in compliance with FDA.   CB:[phone]

## 2016-04-27 NOTE — Telephone Encounter (Signed)
Spoke with pt. Can get out of pocket cost for [facility]40 at [facility] and she requests Rx to go there. [facility].

## 2016-04-27 NOTE — Telephone Encounter (Signed)
PA initiated via Covermymeds; KEY: [id number]. Awaiting determination.

## 2016-04-27 NOTE — Telephone Encounter (Signed)
Pt aware and will call back when she is ready to proceed.

## 2016-04-29 ENCOUNTER — Ambulatory Visit (HOSPITAL_BASED_OUTPATIENT_CLINIC_OR_DEPARTMENT_OTHER): Payer: Medicare Other

## 2016-05-14 ENCOUNTER — Telehealth: Payer: Self-pay | Admitting: Family

## 2016-05-14 DIAGNOSIS — E785 Hyperlipidemia, unspecified: Secondary | ICD-10-CM

## 2016-05-14 NOTE — Telephone Encounter (Signed)
Please advise 

## 2016-05-14 NOTE — Telephone Encounter (Signed)
Relation to NF:AOZHpt:self Call back [phone]   Reason for call:  Patient requesting cholesterol lab orders patient states she wasn't fasting, please advise

## 2016-05-16 NOTE — Telephone Encounter (Signed)
Ok to repeat lipid panel fasting,dx hyperlipidemia. Thanks.

## 2016-05-17 NOTE — Telephone Encounter (Signed)
Notified pt and scheduled lab appt for Friday at 11am. Future lab order entered.

## 2016-05-21 ENCOUNTER — Other Ambulatory Visit (INDEPENDENT_AMBULATORY_CARE_PROVIDER_SITE_OTHER): Payer: Medicare Other

## 2016-05-21 DIAGNOSIS — E785 Hyperlipidemia, unspecified: Secondary | ICD-10-CM | POA: Diagnosis not present

## 2016-05-21 LAB — LIPID PANEL
Cholesterol: 233 mg/dL — ABNORMAL HIGH (ref 0–200)
HDL: 54.6 mg/dL (ref >39.00)
LDL Cholesterol: 157 mg/dL — ABNORMAL HIGH (ref 0–99)
NONHDL: 178.37
Total CHOL/HDL Ratio: 4
Triglycerides: 106 mg/dL (ref 0.0–149.0)
VLDL: 21.2 mg/dL (ref 0.0–40.0)

## 2016-05-25 ENCOUNTER — Telehealth: Payer: Self-pay | Admitting: *Deleted

## 2016-05-25 NOTE — Telephone Encounter (Signed)
Notified pt and she states she is not currently taking Lipitor because she is concerned of potential side effects.  States current cholesterol of "233 is good for average cholesterol result as it used to be in the 250s". Pt states she has appointment on the 21st and will talk with PCP about medication at that time.

## 2016-05-25 NOTE — Telephone Encounter (Signed)
-----   Message from Sandford CrazeMelissa O'Sullivan, NP sent at 05/22/2016  9:09 PM EST ----- Please confirm that pt is taking lipitor 20mg .  If so, need to increase to 40mg  once daily #30 with 2 refills and repeat lipids in 6 weeks, dx hyperlipidemia.

## 2016-05-26 NOTE — Telephone Encounter (Signed)
Noted  

## 2016-06-09 ENCOUNTER — Ambulatory Visit (INDEPENDENT_AMBULATORY_CARE_PROVIDER_SITE_OTHER): Payer: Medicare Other | Admitting: Family

## 2016-06-09 ENCOUNTER — Telehealth: Payer: Self-pay | Admitting: Family

## 2016-06-09 ENCOUNTER — Encounter: Payer: Self-pay | Admitting: Family

## 2016-06-09 VITALS — BP 109/65 | HR 76 | Temp 98.5°F | Ht 63.0 in | Wt 197.2 lb

## 2016-06-09 DIAGNOSIS — F329 Major depressive disorder, single episode, unspecified: Secondary | ICD-10-CM | POA: Diagnosis not present

## 2016-06-09 DIAGNOSIS — E785 Hyperlipidemia, unspecified: Secondary | ICD-10-CM | POA: Diagnosis not present

## 2016-06-09 DIAGNOSIS — K219 Gastro-esophageal reflux disease without esophagitis: Secondary | ICD-10-CM

## 2016-06-09 DIAGNOSIS — G4700 Insomnia, unspecified: Secondary | ICD-10-CM | POA: Diagnosis not present

## 2016-06-09 DIAGNOSIS — F32A Depression, unspecified: Secondary | ICD-10-CM

## 2016-06-09 MED ORDER — PANTOPRAZOLE SODIUM 40 MG PO TBEC: 40.0000 mg | DELAYED_RELEASE_TABLET | Freq: Every day | ORAL | 5 refills | Status: DC

## 2016-06-09 NOTE — Telephone Encounter (Signed)
Caller name: Relationship to patient: Self Can be reached: [phone]  Pharmacy:  [facility] [place] - [address] [phone] (Phone) [phone])     Reason for call: Refill on pantoprazole (PROTONIX) 40 MG tablet

## 2016-06-09 NOTE — Addendum Note (Signed)
Addended by: Mervin KungFERGERSON, Rayan Dyal A on: 06/09/2016 11:37 AM   Modules accepted: Orders

## 2016-06-09 NOTE — Assessment & Plan Note (Signed)
Her calculated 10 yr cardiovascular risk is 10%.  Advised her that in her case benefit outweighs risk for starting statin. She is agreeable to begin.

## 2016-06-09 NOTE — Assessment & Plan Note (Addendum)
Stable back on citalopram. Continue citalopram and wellbutrin.

## 2016-06-09 NOTE — Progress Notes (Signed)
Subjective:    Patient ID: Judy Weiss, female    DOB: 09-Aug-1945, 71 y.o.   MRN: [id number]  HPI  Judy Weiss is a 71 yr old female who presents today for follow up of her depression.  Last visit she had run out of her citalopram and noted a deterioration in her mood. Citalopram was resumed and she was continued on wellbutrin.   Voice hoarseness- last visit we gave pt a trial of protonix. Notes improvement in her symptoms  Insomnia- reports that she sleeps well with ambien.   Wt Readings from Last 3 Encounters:  06/09/16 197 lb 3.2 oz (89.4 kg)  04/23/16 191 lb 9.6 oz (86.9 kg)  01/10/16 208 lb 12.4 oz (94.7 kg)   Hyperlipidemia- reports that she was scared to start statin so she never started.   Review of Systems    see HPI  Past Medical History:  Diagnosis Date  . Allergy    food allergies and some additives. But she does not know what will cause.  . Depression   . GERD (gastroesophageal reflux disease)    had in past but now controlled. Has hiatal hernia.  Marland Kitchen Heart murmur   . History of blood clots 1996?   left leg  . History of colon polyps   . History of hiatal hernia   . Hyperlipidemia      Social History   Social History  . Marital status: Divorced    Spouse name: N/A  . Number of children: N/A  . Years of education: N/A   Occupational History  . Not on file.   Social History Main Topics  . Smoking status: Never Smoker  . Smokeless tobacco: Never Used  . Alcohol use 4.2 oz/week    7 Standard drinks or equivalent per week     Comment: pt  drinks one glass of hard cidar at dinner each day  . Drug use: No  . Sexual activity: No   Other Topics Concern  . Not on file   Social History Narrative   Lives alone- can walk to her daughter's home   Daughter in [place]   Son in [place]   Son [employer] [place]   4 grandchildren (twin girls age 72)   Retired- Presenter, broadcasting at a Associate Professor x 25 years.   Divorced   4 cats and a dog   Enjoys  antiquing.  Buys and sells.  Enjoys shopping    Past Surgical History:  Procedure Laterality Date  . APPENDECTOMY  1972  . DILATION AND CURETTAGE OF UTERUS    . INSERTION OF MESH N/A 01/09/2016   Procedure: INSERTION OF MESH;  Surgeon: Axel Filler, MD;  Location: [facility];  Service: General;  Laterality: N/A;  . KNEE SURGERY Bilateral    hx of torn meniscus (arthroscopic surgery)  . LEG SURGERY Left 2001   Had veins stripped   . SHOULDER SURGERY Right    reports hx arthroscopic surgery    Family History  Problem Relation Age of Onset  . Diabetes Mother   . Hypothyroidism Mother     No Known Allergies  Current Outpatient Prescriptions on File Prior to Visit  Medication Sig Dispense Refill  . aspirin EC 81 MG tablet Take 1 tablet (81 mg total) by mouth daily. (Patient taking differently: Take 162 mg by mouth daily. Take 2 tablets by mouth daily.)    . buPROPion (WELLBUTRIN XL) 300 MG 24 hr tablet Take 1 tablet (300 mg total)  by mouth every morning. 90 tablet 1  . Cholecalciferol (VITAMIN D3) 2000 units capsule Take 2,000 Units by mouth daily.    . citalopram (CELEXA) 40 MG tablet Take 1 tablet (40 mg total) by mouth daily. 90 tablet 1  . clotrimazole-betamethasone (LOTRISONE) cream Apply 1 application topically 2 (two) times daily. 15 g 0  . Cyanocobalamin (VITAMIN B-12) 2500 MCG SUBL Place 1 tablet under the tongue daily.    . pantoprazole (PROTONIX) 40 MG tablet Take 1 tablet (40 mg total) by mouth daily. 30 tablet 1  . zolpidem (AMBIEN) 10 MG tablet Take 0.5 tablets (5 mg total) by mouth at bedtime as needed for sleep. 30 tablet 0  . atorvastatin (LIPITOR) 20 MG tablet Take 1 tablet (20 mg total) by mouth daily. (Patient not taking: Reported on 06/09/2016) 30 tablet 3   No current facility-administered medications on file prior to visit.     BP 109/65 (BP Location: Right Arm, Patient Position: Sitting, Cuff Size: Normal)   Pulse 76   Temp 98.5 F (36.9 C) (Oral)   Ht 5'  3" (1.6 m)   Wt 197 lb 3.2 oz (89.4 kg)   SpO2 96%   BMI 34.93 kg/m    Objective:   Physical Exam  Constitutional: She is oriented to person, place, and time. She appears well-developed and well-nourished.  Cardiovascular: Normal rate, regular rhythm and normal heart sounds.   No murmur heard. Pulmonary/Chest: Effort normal and breath sounds normal. No respiratory distress. She has no wheezes.  Musculoskeletal: She exhibits no edema.  Neurological: She is alert and oriented to person, place, and time.  Psychiatric: She has a normal mood and affect. Her behavior is normal. Judgment and thought content normal.          Assessment & Plan:

## 2016-06-09 NOTE — Assessment & Plan Note (Signed)
Stable on ambien, continue same.  

## 2016-06-09 NOTE — Assessment & Plan Note (Signed)
Hoarseness is improved on PPI, continue same.

## 2016-06-09 NOTE — Patient Instructions (Signed)
Please begin lipitor for your cholesterol.

## 2016-06-09 NOTE — Progress Notes (Signed)
Pre visit review using our clinic review tool, if applicable. No additional management support is needed unless otherwise documented below in the visit note. 

## 2016-06-09 NOTE — Telephone Encounter (Signed)
Rx was sent around 11:35am.

## 2016-06-17 MED FILL — PANTOPRAZOLE SOD DR 40 MG T: 40 | 30 days supply | Qty: 30 | Fill #0

## 2016-06-18 ENCOUNTER — Other Ambulatory Visit: Payer: Self-pay | Admitting: Family

## 2016-06-18 NOTE — Telephone Encounter (Signed)
Caller name: Relationship to patient:Self Can be reached: [phone]  Pharmacy:  [id number] [place], [place] - [address] [phone]) [phone])     Reason for call: Refill on Ambien

## 2016-06-21 MED ORDER — ZOLPIDEM TARTRATE 10 MG PO TABS: 5.0000 mg | ORAL_TABLET | Freq: Every evening | ORAL | 0 refills | Status: DC | PRN

## 2016-06-21 NOTE — Telephone Encounter (Signed)
[facility] [place] 

## 2016-06-21 NOTE — Telephone Encounter (Signed)
See rx. 

## 2016-06-21 NOTE — Telephone Encounter (Signed)
Requesting:  zolpidem Contract    04/23/2016 UDS   Moderate----04/23/16 Last OV    06/09/2016---next 12/08/16  Last Refill   #30    04/23/16  Please Advise

## 2016-06-28 ENCOUNTER — Other Ambulatory Visit: Payer: Self-pay | Admitting: Family

## 2016-07-06 ENCOUNTER — Other Ambulatory Visit (INDEPENDENT_AMBULATORY_CARE_PROVIDER_SITE_OTHER): Payer: Medicare Other

## 2016-07-06 ENCOUNTER — Encounter: Payer: Self-pay | Admitting: Family

## 2016-07-06 DIAGNOSIS — E785 Hyperlipidemia, unspecified: Secondary | ICD-10-CM

## 2016-07-06 LAB — LIPID PANEL
CHOL/HDL RATIO: 3
Cholesterol: 187 mg/dL (ref 0–200)
HDL: 60.3 mg/dL (ref >39.00)
LDL Cholesterol: 108 mg/dL — ABNORMAL HIGH (ref 0–99)
NonHDL: 126.32
TRIGLYCERIDES: 93 mg/dL (ref 0.0–149.0)
VLDL: 18.6 mg/dL (ref 0.0–40.0)

## 2016-07-07 ENCOUNTER — Other Ambulatory Visit: Payer: Medicare Other

## 2016-07-26 ENCOUNTER — Telehealth: Payer: Self-pay | Admitting: Family

## 2016-07-26 MED ORDER — PANTOPRAZOLE SODIUM 40 MG PO TBEC: DELAYED_RELEASE_TABLET | ORAL | 5 refills | Status: DC

## 2016-07-26 NOTE — Telephone Encounter (Signed)
Melissa-  Please advise? 

## 2016-07-26 NOTE — Telephone Encounter (Signed)
rx sent

## 2016-07-26 NOTE — Telephone Encounter (Signed)
Relation to ZO:XWRU Call back number:[phone] Pharmacy: [facility] - [place], [place] - [address] [phone] (Fax)     Reason for call:  Patient requesting refill pantoprazole (PROTONIX) 40 MG tablet. Patient increased to 2x daily due to better results and now patient is completely out, please advise

## 2016-07-27 MED ORDER — CITALOPRAM HYDROBROMIDE 40 MG PO TABS: 40.0000 mg | ORAL_TABLET | Freq: Every day | ORAL | 1 refills | Status: DC

## 2016-07-27 MED ORDER — BUPROPION HCL ER (XL) 300 MG PO TB24: 300.0000 mg | ORAL_TABLET | ORAL | 1 refills | Status: DC

## 2016-07-27 MED FILL — CITALOPRAM HBR 40 MG TABLET: 40 | 90 days supply | Qty: 90 | Fill #0

## 2016-07-27 MED FILL — BUPROPION HCL XL 300 MG TAB: 300 | 90 days supply | Qty: 90 | Fill #0

## 2016-07-27 MED FILL — PANTOPRAZOLE SOD DR 40 MG T: 40 | 30 days supply | Qty: 60 | Fill #0

## 2016-07-27 NOTE — Addendum Note (Signed)
Addended by: Mervin Kung A on: 07/27/2016 11:09 AM   Modules accepted: Orders

## 2016-07-27 NOTE — Telephone Encounter (Signed)
Pt called and stated her pharmacy called and told her the simvastatin prescription was ready and she did not request that. Has not been taking that medication. Called [facility], they did not fill her simvastatin but confirmed that pantoprazole is ready for pick up. Notified pt. She states she got confused about which pharmacy was calling her and will pick it up at the [facility]. States she will not be using [facility] going forward and requests that I send refill of citalopram and bupropion XL to [facility]. Refills sent and message left on [facility] voicemail to cancel remaining refill on citalopram and bupropion Rxs.

## 2016-09-08 MED FILL — PANTOPRAZOLE SOD DR 40 MG T: 40 | 30 days supply | Qty: 60 | Fill #1

## 2016-09-15 ENCOUNTER — Other Ambulatory Visit: Payer: Self-pay | Admitting: *Deleted

## 2016-09-15 MED ORDER — ZOLPIDEM TARTRATE 10 MG PO TABS: 5.0000 mg | ORAL_TABLET | Freq: Every evening | ORAL | 0 refills | Status: DC | PRN

## 2016-09-15 MED FILL — ZOLPIDEM TARTRATE 10 MG TAB: 10 | 60 days supply | Qty: 30 | Fill #0

## 2016-09-15 NOTE — Telephone Encounter (Signed)
Faxed refill request received from [facility] for new RX script for Ambien/zolpidem 10 mg tablet; it is the first time filling at our pharmacy. Last filled by MD on 06/21/16, #30x0 Last AEX - 06/09/16 Next AEX - 6-Myhs. Please Advise on refills [pending print]/SLS 05/30

## 2016-09-15 NOTE — Telephone Encounter (Signed)
See rx. 

## 2016-09-15 NOTE — Telephone Encounter (Signed)
Rx printed but was not signed. [facility], #30 x no refills.

## 2016-11-11 MED FILL — CITALOPRAM HBR 40 MG TABLET: 40 | 90 days supply | Qty: 90 | Fill #1

## 2016-11-11 MED FILL — PANTOPRAZOLE SOD DR 40 MG T: 40 | 30 days supply | Qty: 60 | Fill #2

## 2016-11-26 ENCOUNTER — Telehealth: Payer: Self-pay | Admitting: Family

## 2016-11-26 NOTE — Telephone Encounter (Signed)
Pt would like to come in prior to apt to have labs completed. (12/07/16) pt is scheduled for 12/13/16.  Not showing orders placed. Is this okay?

## 2016-11-27 NOTE — Telephone Encounter (Signed)
I reviewed her chart and don't see that she is due for any labs.

## 2016-11-29 NOTE — Telephone Encounter (Signed)
According to phone notes and refill history, pt has not been taking cholesterol medication. Last rx was sent 04/26/16, #30 x 3 refills. Last cholesterol panel was 06/2016. Would we be checking that yet?

## 2016-12-02 NOTE — Telephone Encounter (Signed)
Please check with pt if she has been taking statin, if so, can place order for flp.  If not, hold off on labs and we can discuss at 6 month follow up.

## 2016-12-03 NOTE — Telephone Encounter (Signed)
Spoke with pt. She states she has not been taking cholesterol medication. Was taking it at last lab check in march. Pt has follow up with PCP on 12/13/16 and will wait to discuss it at that time.

## 2016-12-06 ENCOUNTER — Other Ambulatory Visit: Payer: Self-pay | Admitting: Family

## 2016-12-06 MED FILL — BUPROPION HCL XL 300 MG TAB: 300 | 90 days supply | Qty: 90 | Fill #1

## 2016-12-07 ENCOUNTER — Other Ambulatory Visit: Payer: Medicare Other

## 2016-12-07 MED FILL — ZOLPIDEM TARTRATE 10 MG TAB: 10 | 60 days supply | Qty: 30 | Fill #0

## 2016-12-07 NOTE — Telephone Encounter (Signed)
Last zolpidem RX: 09/15/16, #30 Last OV: 06/09/16 Next OV:  12/13/16 UDS: 04/23/16, moderate. Will collect at upcoming office visit.  Rx printed and forwarded to PCP for signature.

## 2016-12-07 NOTE — Telephone Encounter (Signed)
Rx faxed to pharmacy at 3:24pm.

## 2016-12-08 ENCOUNTER — Ambulatory Visit: Payer: Medicare Other | Admitting: Family

## 2016-12-13 ENCOUNTER — Emergency Department (HOSPITAL_BASED_OUTPATIENT_CLINIC_OR_DEPARTMENT_OTHER)
Admission: EM | Admit: 2016-12-13 | Discharge: 2016-12-13 | Disposition: A | Discharge status: Home / Self Care | Payer: Medicare Other | Attending: Emergency Medicine | Admitting: Emergency Medicine

## 2016-12-13 ENCOUNTER — Other Ambulatory Visit: Payer: Self-pay

## 2016-12-13 ENCOUNTER — Emergency Department (HOSPITAL_BASED_OUTPATIENT_CLINIC_OR_DEPARTMENT_OTHER): Payer: Medicare Other

## 2016-12-13 ENCOUNTER — Encounter (HOSPITAL_BASED_OUTPATIENT_CLINIC_OR_DEPARTMENT_OTHER): Payer: Self-pay | Admitting: *Deleted

## 2016-12-13 ENCOUNTER — Ambulatory Visit (INDEPENDENT_AMBULATORY_CARE_PROVIDER_SITE_OTHER): Payer: Medicare Other | Admitting: Family

## 2016-12-13 ENCOUNTER — Encounter: Payer: Self-pay | Admitting: Family

## 2016-12-13 VITALS — BP 128/81 | HR 70 | Temp 98.8°F | Ht 63.0 in | Wt 196.0 lb

## 2016-12-13 DIAGNOSIS — K219 Gastro-esophageal reflux disease without esophagitis: Secondary | ICD-10-CM | POA: Diagnosis not present

## 2016-12-13 DIAGNOSIS — G454 Transient global amnesia: Secondary | ICD-10-CM | POA: Insufficient documentation

## 2016-12-13 DIAGNOSIS — R410 Disorientation, unspecified: Secondary | ICD-10-CM | POA: Diagnosis not present

## 2016-12-13 DIAGNOSIS — Z7982 Long term (current) use of aspirin: Secondary | ICD-10-CM | POA: Insufficient documentation

## 2016-12-13 DIAGNOSIS — R413 Other amnesia: Secondary | ICD-10-CM

## 2016-12-13 DIAGNOSIS — Z79899 Other long term (current) drug therapy: Secondary | ICD-10-CM | POA: Insufficient documentation

## 2016-12-13 DIAGNOSIS — F329 Major depressive disorder, single episode, unspecified: Secondary | ICD-10-CM | POA: Diagnosis not present

## 2016-12-13 DIAGNOSIS — R4182 Altered mental status, unspecified: Secondary | ICD-10-CM

## 2016-12-13 DIAGNOSIS — F32A Depression, unspecified: Secondary | ICD-10-CM

## 2016-12-13 LAB — AMMONIA

## 2016-12-13 LAB — COMPREHENSIVE METABOLIC PANEL
ALT: 13 U/L — ABNORMAL LOW (ref 14–54)
ANION GAP: 9 (ref 5–15)
AST: 20 U/L (ref 15–41)
Albumin: 4.3 g/dL (ref 3.5–5.0)
Alkaline Phosphatase: 58 U/L (ref 38–126)
BILIRUBIN TOTAL: 0.6 mg/dL (ref 0.3–1.2)
BUN: 17 mg/dL (ref 6–20)
CO2: 26 mmol/L (ref 22–32)
Calcium: 9 mg/dL (ref 8.9–10.3)
Chloride: 104 mmol/L (ref 101–111)
Creatinine, Ser: 0.74 mg/dL (ref 0.44–1.00)
Glucose, Bld: 108 mg/dL — ABNORMAL HIGH (ref 65–99)
POTASSIUM: 4 mmol/L (ref 3.5–5.1)
Sodium: 139 mmol/L (ref 135–145)
TOTAL PROTEIN: 7.5 g/dL (ref 6.5–8.1)

## 2016-12-13 LAB — URINALYSIS, ROUTINE W REFLEX MICROSCOPIC
Glucose, UA: NEGATIVE mg/dL
Hgb urine dipstick: NEGATIVE
KETONES UR: 15 mg/dL — AB
NITRITE: NEGATIVE
PROTEIN: NEGATIVE mg/dL
SPECIFIC GRAVITY, URINE: 1.024 (ref 1.005–1.030)
pH: 6 (ref 5.0–8.0)

## 2016-12-13 LAB — RAPID URINE DRUG SCREEN, HOSP PERFORMED
AMPHETAMINES: NOT DETECTED
BARBITURATES: NOT DETECTED
Benzodiazepines: NOT DETECTED
COCAINE: NOT DETECTED
OPIATES: NOT DETECTED
Tetrahydrocannabinol: NOT DETECTED

## 2016-12-13 LAB — CBG MONITORING, ED: Glucose-Capillary: 94 mg/dL (ref 65–99)

## 2016-12-13 LAB — CBC WITH DIFFERENTIAL/PLATELET
BASOS ABS: 0 10*3/uL (ref 0.0–0.1)
BASOS PCT: 0 %
EOS ABS: 0.2 10*3/uL (ref 0.0–0.7)
Eosinophils Relative: 4 %
HCT: 38.3 % (ref 36.0–46.0)
HEMOGLOBIN: 12.2 g/dL (ref 12.0–15.0)
LYMPHS ABS: 1.5 10*3/uL (ref 0.7–4.0)
Lymphocytes Relative: 31 %
MCH: 27.9 pg (ref 26.0–34.0)
MCHC: 31.9 g/dL (ref 30.0–36.0)
MCV: 87.4 fL (ref 78.0–100.0)
Monocytes Absolute: 0.3 10*3/uL (ref 0.1–1.0)
Monocytes Relative: 7 %
NEUTROS PCT: 58 %
Neutro Abs: 2.7 10*3/uL (ref 1.7–7.7)
PLATELETS: 228 10*3/uL (ref 150–400)
RBC: 4.38 MIL/uL (ref 3.87–5.11)
RDW: 13.5 % (ref 11.5–15.5)
WBC: 4.7 10*3/uL (ref 4.0–10.5)

## 2016-12-13 LAB — URINALYSIS, MICROSCOPIC (REFLEX)

## 2016-12-13 LAB — I-STAT VENOUS BLOOD GAS, ED
BICARBONATE: 26.7 mmol/L (ref 20.0–28.0)
O2 SAT: 57 %
PCO2 VEN: 49.3 mmHg (ref 44.0–60.0)
PO2 VEN: 32 mmHg (ref 32.0–45.0)
TCO2: 28 mmol/L (ref 22–32)
pH, Ven: 7.343 (ref 7.250–7.430)

## 2016-12-13 LAB — TROPONIN I: Troponin I: <0.03 ng/mL (ref <0.03)

## 2016-12-13 LAB — I-STAT CG4 LACTIC ACID, ED: LACTIC ACID, VENOUS: 1.09 mmol/L (ref 0.5–1.9)

## 2016-12-13 MED ORDER — SODIUM CHLORIDE 0.9 % IV BOLUS (SEPSIS)
1000.0000 mL | Freq: Once | INTRAVENOUS | Status: AC
  Administered 2016-12-13: 1000 mL via INTRAVENOUS

## 2016-12-13 NOTE — ED Provider Notes (Addendum)
[facility] Provider Note   CSN: [id number] Arrival date & time: 12/13/16  1508     History   Chief Complaint Chief Complaint  Patient presents with  . Altered Mental Status    HPI Judy Weiss is a 71 y.o. female.  71 yo F with a chief complaints of confusion. This been going on for the past couple hours. She went to her family physician's appointment and was noted to be confused. Sent down for altered mental status workup. Patient denies any infectious symptoms any cough congestion fever rhinorrhea area denies dental pain. Denies headaches. Denies neck pain chest pain abdominal pain. Denies nausea vomiting or diarrhea. Denies any new medications or supplements. She states that she is having trouble with some short-term memory loss. Is having trouble remembering what she wanted to talk to her family physician about.   The history is provided by the patient.  Altered Mental Status   This is a new problem. The current episode started 3 to 5 hours ago. The problem has not changed since onset.Associated symptoms include confusion. Risk factors: none. Her past medical history does not include CVA, TIA, dementia, psychotropic medication treatment or head trauma.    Past Medical History:  Diagnosis Date  . Allergy    food allergies and some additives. But she does not know what will cause.  . Depression   . GERD (gastroesophageal reflux disease)    had in past but now controlled. Has hiatal hernia.  Marland Kitchen Heart murmur   . History of blood clots 1996?   left leg  . History of colon polyps   . History of hiatal hernia   . Hyperlipidemia     Patient Active Problem List   Diagnosis Date Noted  . Insomnia 06/09/2016  . Alcohol dependence (HCC) 01/13/2016  . Hypoxia 01/11/2016  . 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 Depression 05/02/2015  . GERD (gastroesophageal reflux disease) s/p  Nissen fundoplication 01/09/2016 05/02/2015  . History of DVT (deep vein thrombosis) 05/02/2015  . Lichen sclerosus 05/02/2015  . Hyperlipidemia 05/02/2015  . Venous insufficiency 01/06/2011    Past Surgical History:  Procedure Laterality Date  . APPENDECTOMY  1972  . DILATION AND CURETTAGE OF UTERUS    . INSERTION OF MESH N/A 01/09/2016   Procedure: INSERTION OF MESH;  Surgeon: Axel Filler, MD;  Location: [facility];  Service: General;  Laterality: N/A;  . KNEE SURGERY Bilateral    hx of torn meniscus (arthroscopic surgery)  . LEG SURGERY Left 2001   Had veins stripped   . SHOULDER SURGERY Right    reports hx arthroscopic surgery    OB History    No data available       Home Medications    Prior to Admission medications   Medication Sig Start Date End Date Taking? Authorizing Provider  aspirin EC 81 MG tablet Take 1 tablet (81 mg total) by mouth daily. Patient taking differently: Take 162 mg by mouth daily. Take 2 tablets by mouth daily. 05/02/15   Sandford Craze, NP  buPROPion (WELLBUTRIN XL) 300 MG 24 hr tablet Take 1 tablet (300 mg total) by mouth every morning. 07/27/16   Sandford Craze, NP  citalopram (CELEXA) 40 MG tablet Take 1 tablet (40 mg total) by mouth daily. 07/27/16   Sandford Craze, NP  pantoprazole (PROTONIX) 40 MG tablet TAKE 1 TABLET(40 MG) BY MOUTH twice daily 07/26/16   Sandford Craze, NP  zolpidem (AMBIEN) 10 MG tablet TAKE 1/2 TABLET BY MOUTH AT BEDTIME AS NEEDED FOR SLEEP 12/07/16   Sandford Craze, NP    Family History Family History  Problem Relation Age of Onset  . Diabetes Mother   . Hypothyroidism Mother     Social History Social History  Substance Use Topics  . Smoking status: Never Smoker  . Smokeless tobacco: Never Used  . Alcohol use 4.2 oz/week    7 Standard drinks or equivalent per week     Comment: pt  drinks one glass of hard cidar at dinner each day     Allergies   Patient has no known  allergies.   Review of Systems Review of Systems  Constitutional: Negative for chills and fever.  HENT: Negative for congestion and rhinorrhea.   Eyes: Negative for redness and visual disturbance.  Respiratory: Negative for shortness of breath and wheezing.   Cardiovascular: Negative for chest pain and palpitations.  Gastrointestinal: Negative for nausea and vomiting.  Genitourinary: Negative for dysuria and urgency.  Musculoskeletal: Negative for arthralgias and myalgias.  Skin: Negative for pallor and wound.  Neurological: Negative for dizziness and headaches.       Memory issues  Psychiatric/Behavioral: Positive for confusion.     Physical Exam Updated Vital Signs BP 112/67   Pulse (!) 59   Temp 98.4 F (36.9 C) (Oral)   Resp 18   SpO2 99%   Physical Exam  Constitutional: She is oriented to person, place, and time. She appears well-developed and well-nourished. No distress.  HENT:  Head: Normocephalic and atraumatic.  Eyes: Pupils are equal, round, and reactive to light. EOM are normal.  Neck: Normal range of motion. Neck supple.  Cardiovascular: Normal rate and regular rhythm.  Exam reveals no gallop and no friction rub.   No murmur heard. Pulmonary/Chest: Effort normal. She has no wheezes. She has no rales.  Abdominal: Soft. She exhibits no distension. There is no tenderness.  Musculoskeletal: She exhibits no edema or tenderness.  Neurological: She is alert and oriented to person, place, and time. She has normal strength. No cranial nerve deficit or sensory deficit. She displays a negative Romberg sign. Coordination and gait normal. GCS eye subscore is 4. GCS verbal subscore is 5. GCS motor subscore is 6. She displays no Babinski's sign on the right side. She displays no Babinski's sign on the left side.  Reflex Scores:      Tricep reflexes are 2+ on the right side and 2+ on the left side.      Bicep reflexes are 2+ on the right side and 2+ on the left side.       Brachioradialis reflexes are 2+ on the right side and 2+ on the left side.      Patellar reflexes are 2+ on the right side and 2+ on the left side.      Achilles reflexes are 2+ on the right side and 2+ on the left side. Skin: Skin is warm and dry. She is not diaphoretic.  Psychiatric: She has a normal mood and affect. Her behavior is normal.  Nursing note and vitals reviewed.    ED Treatments / Results  Labs (all labs ordered are listed, but only abnormal results are displayed) Labs Reviewed  COMPREHENSIVE METABOLIC PANEL - Abnormal; Notable for the following:       Result Value   Glucose, Bld 108 (*)    ALT 13 (*)    All other components within normal limits  URINALYSIS,  ROUTINE W REFLEX MICROSCOPIC - Abnormal; Notable for the following:    Bilirubin Urine SMALL (*)    Ketones, ur 15 (*)    Leukocytes, UA MODERATE (*)    All other components within normal limits  AMMONIA - Abnormal; Notable for the following:    Ammonia <9 (*)    All other components within normal limits  URINALYSIS, MICROSCOPIC (REFLEX) - Abnormal; Notable for the following:    Bacteria, UA FEW (*)    Squamous Epithelial / LPF 0-5 (*)    All other components within normal limits  RAPID URINE DRUG SCREEN, HOSP PERFORMED  CBC WITH DIFFERENTIAL/PLATELET  TROPONIN I  BLOOD GAS, VENOUS  CBG MONITORING, ED  CBG MONITORING, ED  I-STAT CG4 LACTIC ACID, ED  I-STAT VENOUS BLOOD GAS, ED    EKG  EKG Interpretation  Date/Time:  Monday December 13 2016 19:47:37 EDT Ventricular Rate:  65 PR Interval:    QRS Duration: 106 QT Interval:  428 QTC Calculation: 445 R Axis:   22 Text Interpretation:  Sinus rhythm No significant change since last tracing Confirmed by Melene Plan [phone]) on 12/13/2016 8:37:28 PM       Radiology Dg Chest 2 View  Result Date: 12/13/2016 CLINICAL DATA:  Acute altered mental status. EXAM: CHEST  2 VIEW COMPARISON:  01/12/2016 and prior chest radiographs.  01/14/2016 CT FINDINGS: The  cardiomediastinal silhouette is unchanged. Posterior lower lobe opacity on the lateral view probably represents atelectasis or scarring. There is no evidence of pulmonary edema, pleural effusion, or pneumothorax. No acute bony abnormalities are identified. IMPRESSION: Posterior lower lobe opacity on the lateral view -likely atelectasis or scarring. No other significant abnormalities. Electronically Signed   By: Harmon Pier M.D.   On: 12/13/2016 16:14   Ct Head Wo Contrast  Result Date: 12/13/2016 CLINICAL DATA:  Confusion, unexplained altered level of consciousness. EXAM: CT HEAD WITHOUT CONTRAST TECHNIQUE: Contiguous axial images were obtained from the base of the skull through the vertex without intravenous contrast. COMPARISON:  None. FINDINGS: Brain: Mild generalized age related parenchymal atrophy with commensurate dilatation of the ventricles and sulci. Chronic small vessel ischemic changes within the bilateral periventricular subcortical white matter. No mass, hemorrhage, edema or other evidence of acute parenchymal abnormality. No extra-axial hemorrhage. Vascular: There are chronic calcified atherosclerotic changes of the large vessels at the skull base. No unexpected hyperdense vessel. Skull: Normal. Negative for fracture or focal lesion. Sinuses/Orbits: No acute finding. Other: None. IMPRESSION: 1. No acute findings.  No intracranial mass, hemorrhage or edema. 2. Chronic small vessel ischemic changes in the white matter. Electronically Signed   By: Bary Richard M.D.   On: 12/13/2016 16:08    Procedures Procedures (including critical care time)  Medications Ordered in ED Medications  sodium chloride 0.9 % bolus 1,000 mL (0 mLs Intravenous Stopped 12/13/16 1721)     Initial Impression / Assessment and Plan / ED Course  I have reviewed the triage vital signs and the nursing notes.  Pertinent labs & imaging results that were available during my care of the patient were reviewed by me and  considered in my medical decision making (see chart for details).     71 yo F With a chief complaint of confusion. Started couple hours ago.  The patient is most upset that she cannot remember what she was can tell her doctor at the appointment. Still is unsure what she wanted to talk about specifically. She knows she is concerned about her Nissen and that she  is having trouble getting food down. Workup here was extensive and unremarkable. I discussed the results with the patient. I offered to discuss her case with the hospitalist for possible admission. At this point the patient is requesting discharge home.  Nursing approached me because they did not feel that the patient is able to be discharged home.  Does not know her home address.  Daughter was called as is at bedside.  Concerned about her acute change, as well as provides further hx.  Has been having episodes of diffuse diaphoresis.  Three in past couple of weeks.  Complaining of chest pain.     Trop negative, patient adamant that she would like to be discharged home.  Family available to take her home.  PCP follow up tomorrow.   11:11 PM:  I have discussed the diagnosis/risks/treatment options with the patient and family and believe the pt to be eligible for discharge home to follow-up with PCP. We also discussed returning to the ED immediately if new or worsening sx occur. We discussed the sx which are most concerning (e.g., sudden worsening pain, fever, inability to tolerate by mouth) that necessitate immediate return. Medications administered to the patient during their visit and any new prescriptions provided to the patient are listed below.  Medications given during this visit Medications  sodium chloride 0.9 % bolus 1,000 mL (0 mLs Intravenous Stopped 12/13/16 1721)     The patient appears reasonably screen and/or stabilized for discharge and I doubt any other medical condition or other [facility] requiring further screening, evaluation, or  treatment in the ED at this time prior to discharge.    Medications given during this visit Medications  sodium chloride 0.9 % bolus 1,000 mL (0 mLs Intravenous Stopped 12/13/16 1721)     The patient appears reasonably screen and/or stabilized for discharge and I doubt any other medical condition or other [facility] requiring further screening, evaluation, or treatment in the ED at this time prior to discharge.    Final Clinical Impressions(s) / ED Diagnoses   Final diagnoses:  Transient amnesia    New Prescriptions Discharge Medication List as of 12/13/2016  8:46 PM       Melene Plan, DO 12/13/16 1717    Melene Plan, DO 12/13/16 2311

## 2016-12-13 NOTE — ED Notes (Signed)
When attempting to discharge pt, pt noted to be slow to respond to answers. Pt unable to state date, but sts "it's the first day of school"; she stated year was "2018." Pt gave wrong house number for her address. Pt kept saying, "I don't know why I feel so slow." Pt drove herself here and no other visitors present. Pt consented for RN to call her daughter, Sumner Boast [phone]). Number called, VM left. EDP and PMN aware.

## 2016-12-13 NOTE — ED Notes (Signed)
Daughter here for patient.  Explained to daughter some of her symptoms and that dr. Lendell Caprice had referred her.  Daughter was concerned that she did not remember why she came to the doctor.  She requested to see Dr. Adela Lank

## 2016-12-13 NOTE — ED Notes (Signed)
Spoke to pt's daughter, Sumner Boast. She is on her way here (about 20 mins away).

## 2016-12-13 NOTE — Patient Instructions (Addendum)
Please proceed to the Emergency Department for further evaluation.  Restart your protonix for your reflux.

## 2016-12-13 NOTE — Progress Notes (Signed)
Subjective:    Patient ID: Judy Weiss, female    DOB: [birth year], 71 y.o.   MRN: [id number]  HPI  Judy Weiss is a 71 yr old female who presents today for follow up.  Depression- feels like her mood is not as good. Has not been very active.  Daughter-  did take her to the beach over the weekend.  GERD- reports that she has feels like food "sits there a while" in her esophagus.  She reports that she cannot remember if she is taking pantoprazole.    She reports that she has had 2 episodes of sweating/weakness while driving in the last 2 months. She denies associated chest pain or sob.   Reports "I just don't feel like myself today."    Review of Systems See HPI  Past Medical History:  Diagnosis Date  . Allergy    food allergies and some additives. But she does not know what will cause.  . Depression   . GERD (gastroesophageal reflux disease)    had in past but now controlled. Has hiatal hernia.  Marland Kitchen Heart murmur   . History of blood clots 1996?   left leg  . History of colon polyps   . History of hiatal hernia   . Hyperlipidemia      Social History   Social History  . Marital status: Divorced    Spouse name: N/A  . Number of children: N/A  . Years of education: N/A   Occupational History  . Not on file.   Social History Main Topics  . Smoking status: Never Smoker  . Smokeless tobacco: Never Used  . Alcohol use 4.2 oz/week    7 Standard drinks or equivalent per week     Comment: pt  drinks one glass of hard cidar at dinner each day  . Drug use: No  . Sexual activity: No   Other Topics Concern  . 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   Divorced   4 cats and a dog   Enjoys antiquing.  Buys and sells.  Enjoys shopping    Past Surgical  History:  Procedure Laterality Date  . APPENDECTOMY  1972  . DILATION AND CURETTAGE OF UTERUS    . INSERTION OF MESH N/A 01/09/2016   Procedure: INSERTION OF MESH;  Surgeon: Axel Filler, MD;  Location: [facility];  Service: General;  Laterality: N/A;  . KNEE SURGERY Bilateral    hx of torn meniscus (arthroscopic surgery)  . LEG SURGERY Left 2001   Had veins stripped   . SHOULDER SURGERY Right    reports hx arthroscopic surgery    Family History  Problem Relation Age of Onset  . Diabetes Mother   . Hypothyroidism Mother     No Known Allergies  Current Outpatient Prescriptions on File Prior to Visit  Medication Sig Dispense Refill  . aspirin EC 81 MG tablet Take 1 tablet (81 mg total) by mouth daily. (Patient taking differently: Take 162 mg by mouth daily. Take 2 tablets by mouth daily.)    . buPROPion (WELLBUTRIN XL) 300 MG 24 hr tablet Take 1 tablet (300 mg total) by mouth every morning. 90 tablet 1  . citalopram (CELEXA) 40 MG  tablet Take 1 tablet (40 mg total) by mouth daily. 90 tablet 1  . pantoprazole (PROTONIX) 40 MG tablet TAKE 1 TABLET(40 MG) BY MOUTH twice daily 60 tablet 5  . zolpidem (AMBIEN) 10 MG tablet TAKE 1/2 TABLET BY MOUTH AT BEDTIME AS NEEDED FOR SLEEP 30 tablet 0   No current facility-administered medications on file prior to visit.     BP 128/81   Pulse 70   Temp 98.8 F (37.1 C) (Oral)   Ht 5\' 3"  (1.6 m)   Wt 196 lb (88.9 kg)   SpO2 98%   BMI 34.72 kg/m       Objective:   Physical Exam  Constitutional: She appears well-developed. No distress.  Cardiovascular: Normal rate and regular rhythm.   No murmur heard. Pulmonary/Chest: Effort normal and breath sounds normal. No respiratory distress. She has no wheezes. She has no rales. She exhibits no tenderness.  Musculoskeletal:  2+ bilateral LE edema  Neurological: She is alert. No cranial nerve deficit. She exhibits normal muscle tone. Coordination normal.  Skin: Skin is warm and dry.    Psychiatric:  Confused, mildly tearful Oriented to year Unable to name president Oriented to place          Assessment & Plan:  Depression- unable to full assess. Not clear if her depression is contributing to her altared mental status.  GERD- uncontrolled. Restart pantoprazole. If symptoms fail to improve will plan referral to GI.  AMS- will refer to ED for further evaluation. Need to rule out CVA/infection.  It is unclear if what is causing her mental status change today but she is clearly not herself. I offered to call her daughter to let her know that I am sending her to the ED but she declines.  I gave report to Dr. Pecola Leisure in the ED and the patient will be escorted to the ED by CMA.

## 2016-12-13 NOTE — ED Triage Notes (Signed)
Seen by her MD and sent here for further evaluation but pt is unable to tell me why she is here. She is confused. Denies pain.

## 2016-12-14 ENCOUNTER — Encounter: Payer: Self-pay | Admitting: Neurology

## 2016-12-21 MED FILL — PANTOPRAZOLE SOD DR 40 MG T: 40 | 30 days supply | Qty: 60 | Fill #3

## 2016-12-30 ENCOUNTER — Ambulatory Visit: Payer: Medicare Other | Admitting: Neurology

## 2017-02-10 ENCOUNTER — Other Ambulatory Visit: Payer: Self-pay | Admitting: Family

## 2017-02-10 MED FILL — CITALOPRAM HBR 40 MG TABLET: 40 | 90 days supply | Qty: 90 | Fill #0

## 2017-02-10 MED FILL — PANTOPRAZOLE SOD DR 40 MG T: 40 | 30 days supply | Qty: 60 | Fill #4

## 2017-02-10 NOTE — Telephone Encounter (Signed)
Rx approved and sent to the pharmacy by e-script.//AB/CMA 

## 2017-03-24 ENCOUNTER — Other Ambulatory Visit: Payer: Self-pay | Admitting: Family

## 2017-03-24 MED FILL — PANTOPRAZOLE SOD DR 40 MG T: 40 | 30 days supply | Qty: 60 | Fill #5

## 2017-03-25 ENCOUNTER — Telehealth: Payer: Self-pay | Admitting: Family

## 2017-03-25 ENCOUNTER — Ambulatory Visit (INDEPENDENT_AMBULATORY_CARE_PROVIDER_SITE_OTHER): Payer: Medicare Other | Admitting: Neurology

## 2017-03-25 ENCOUNTER — Encounter: Payer: Self-pay | Admitting: Neurology

## 2017-03-25 ENCOUNTER — Other Ambulatory Visit: Payer: Medicare Other

## 2017-03-25 ENCOUNTER — Telehealth: Payer: Self-pay

## 2017-03-25 VITALS — BP 142/90 | HR 82 | Ht 63.0 in | Wt 201.0 lb

## 2017-03-25 DIAGNOSIS — Z79899 Other long term (current) drug therapy: Secondary | ICD-10-CM

## 2017-03-25 DIAGNOSIS — G4700 Insomnia, unspecified: Secondary | ICD-10-CM

## 2017-03-25 DIAGNOSIS — R413 Other amnesia: Secondary | ICD-10-CM

## 2017-03-25 LAB — TSH: TSH: 3.26 m[IU]/L (ref 0.40–4.50)

## 2017-03-25 LAB — VITAMIN B12: Vitamin B-12: 1666 pg/mL — ABNORMAL HIGH (ref 200–1100)

## 2017-03-25 MED FILL — BUPROPION HCL XL 300 MG TAB: 300 | 90 days supply | Qty: 90 | Fill #0

## 2017-03-25 MED FILL — ZOLPIDEM TARTRATE 10 MG TAB: 10 | 60 days supply | Qty: 30 | Fill #0

## 2017-03-25 NOTE — Telephone Encounter (Signed)
PA initiated via Covermymeds; KEY: [id number]. Awaiting determination.

## 2017-03-25 NOTE — Telephone Encounter (Signed)
Last zolpidem RX:  12/07/16, #30 Last OV: 12/13/16 Next OV:  None scheduled UDS: 04/23/16 and was due 07/2016. CSC: 04/23/16 CS Registry: Reviewed and no discrepancies noted.

## 2017-03-25 NOTE — Telephone Encounter (Signed)
Refill send but due for uds.

## 2017-03-25 NOTE — Patient Instructions (Addendum)
1. Bloodwork for TSH, B12  Your provider has requested that you have labwork completed today. Please go to [facility] ([id number]) on the second floor of this building before leaving the office today. You do not need to check in. If you are not called within 15 minutes please check with the front desk.   2. Schedule MRI brain without contrast  We have [facility] a referral [facility] and they will call you directly to schedule your appt. They are located at [address] If you need to contact them directly please call [phone].  3. Continue to monitor anxiety, as this can also cause confusion and memory issues 4. Follow-up in 6 months, call for any changes  RECOMMENDATIONS FOR ALL PATIENTS WITH MEMORY PROBLEMS: 1. Continue to exercise (Recommend 30 minutes of walking everyday, or 3 hours every week) 2. Increase social interactions - continue going to [place] and enjoy social gatherings with friends and family 3. Eat healthy, avoid fried foods and eat more fruits and vegetables 4. Maintain adequate blood pressure, blood sugar, and blood cholesterol level. Reducing the risk of stroke and cardiovascular disease also helps promoting better memory. 5. Avoid stressful situations. Live a simple life and avoid aggravations. Organize your time and prepare for the next day in anticipation. 6. Sleep well, avoid any interruptions of sleep and avoid any distractions in the bedroom that may interfere with adequate sleep quality 7. Avoid sugar, avoid sweets as there is a strong link between excessive sugar intake, diabetes, and cognitive impairment We discussed the Mediterranean diet, which has been shown to help patients reduce the risk of progressive memory disorders and reduces cardiovascular risk. This includes eating fish, eat fruits and green leafy vegetables, nuts like almonds and hazelnuts, walnuts, and also use olive oil. Avoid fast foods and fried foods as much as  possible. Avoid sweets and sugar as sugar use has been linked to worsening of memory function.

## 2017-03-25 NOTE — Progress Notes (Signed)
NEUROLOGY CONSULTATION NOTE  Judy Weiss MRN: [id number] DOB: 1945/06/28  Referring provider: Sandford CrazeMelissa O'Sullivan, NP Primary care provider: Sandford CrazeMelissa O'Sullivan, NP  Reason for consult:  Transient amnesia  Thank you for your kind referral of Judy Weiss for consultation of the above symptoms. Although her history is well known to you, please allow me to reiterate it for the purpose of our medical record. Records and images were personally reviewed where available.  HISTORY OF PRESENT ILLNESS: This is a pleasant 71 year old right-handed woman with a history of hyperlipidemia, depression, presenting for evaluation of transient amnesia. She reports that she had a regular PCP appointment with her PCP and was hesitant in answering questions, then she was "considered confused and sent to the ER." On review of PCP notes from 12/13/16, she reported "I just don't feel like myself today." She was noted to be confused, mildly tearful, oriented to year and place, unable to name president. She was clearly not herself. In there ER she reported trouble with short-term memory loss and trouble remembering what she wanted to talk to her PCP about. CBC, CMP, UDS were negative. Her urinalysis showed 0-5 WBC, moderate leukocytes. Head CT no acute changes, there was mild diffuse atrophy and chronic microvascular disease. Nursing staff expressed concern about going home, she did not know her home address. She was adamant to be discharged home. She reports that her daughter has urged her to see a neurologist because she has noticed changes, but the patient cannot elaborate what her daughter's concerns are. She sometimes does feel a little confused, more with short-term memory difficulties. She denies getting lost driving, but sometimes forgets where she parked her car. She denies any missed bills or medications. She feels her mind is a little fuzzy and she is "just slow in answering." She missed her initial appointment in  our office because she got so anxious that she could not find our office and got so confused. She is taking Celexa and Bupropion. She continues to buy and sell antiques. She had a fall in the past year, unsure if she passed out with it. She has occasional dizziness and has noticed a change in her walking, she is not as stable as before. She denies any focal numbness/tingling/weakness, no bowel/bladder dysfunction. She has low back pain. She denies any headache, diplopia, dysarthria, tremors, or anosmia. She has had some difficulty swallowing and has been diagnosed with an esophageal hernia. She underwent surgery but feels she has had more problems after surgery, she feels food wants to come up 2-3 hours after eating. She denies any staring/unresponsive episodes, gaps in time, denies any olfactory/gustatory hallucinations, deja vu, rising epigastric sensation, myoclonic jerks. She had a normal birth and early development.  There is no history of febrile convulsions, CNS infections such as meningitis/encephalitis, significant traumatic brain injury, neurosurgical procedures, or family history of seizures.  PAST MEDICAL HISTORY: Past Medical History:  Diagnosis Date  . Allergy    food allergies and some additives. But she does not know what will cause.  . Depression   . GERD (gastroesophageal reflux disease)    had in past but now controlled. Has hiatal hernia.  Marland Kitchen. Heart murmur   . History of blood clots 1996?   left leg  . History of colon polyps   . History of hiatal hernia   . Hyperlipidemia     PAST SURGICAL HISTORY: Past Surgical History:  Procedure Laterality Date  . APPENDECTOMY  1972  . DILATION AND CURETTAGE  OF UTERUS    . INSERTION OF MESH N/A 01/09/2016   Procedure: INSERTION OF MESH;  Surgeon: Axel FillerArmando Ramirez, MD;  Location: [facility];  Service: General;  Laterality: N/A;  . KNEE SURGERY Bilateral    hx of torn meniscus (arthroscopic surgery)  . LEG SURGERY Left 2001   Had veins  stripped   . SHOULDER SURGERY Right    reports hx arthroscopic surgery    MEDICATIONS: Current Outpatient Medications on File Prior to Visit  Medication Sig Dispense Refill  . aspirin EC 81 MG tablet Take 1 tablet (81 mg total) by mouth daily. (Patient taking differently: Take 162 mg by mouth daily. Take 2 tablets by mouth daily.)    . buPROPion (WELLBUTRIN XL) 300 MG 24 hr tablet Take 1 tablet (300 mg total) by mouth every morning. 90 tablet 1  . citalopram (CELEXA) 40 MG tablet TAKE 1 TABLET (40 MG TOTAL) BY MOUTH DAILY. 90 tablet 1  . pantoprazole (PROTONIX) 40 MG tablet TAKE 1 TABLET(40 MG) BY MOUTH twice daily 60 tablet 5  . zolpidem (AMBIEN) 10 MG tablet TAKE 1/2 TABLET BY MOUTH AT BEDTIME AS NEEDED FOR SLEEP 30 tablet 0   No current facility-administered medications on file prior to visit.     ALLERGIES: No Known Allergies  FAMILY HISTORY: Family History  Problem Relation Age of Onset  . Diabetes Mother   . Hypothyroidism Mother     SOCIAL HISTORY: Social History   Socioeconomic History  . Marital status: Divorced    Spouse name: Not on file  . Number of children: Not on file  . Years of education: Not on file  . Highest education level: Not on file  Social Needs  . Financial resource strain: Not on file  . Food insecurity - worry: Not on file  . Food insecurity - inability: Not on file  . Transportation needs - medical: Not on file  . Transportation needs - non-medical: Not on file  Occupational History  . Not on file  Tobacco Use  . Smoking status: Never Smoker  . Smokeless tobacco: Never Used  Substance and Sexual Activity  . Alcohol use: Yes    Alcohol/week: 4.2 oz    Types: 7 Standard drinks or equivalent per week    Comment: pt  drinks one glass of hard cidar at dinner each day  . Drug use: No  . Sexual activity: No  Other Topics Concern  . 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   Divorced   4 cats and a dog   Enjoys antiquing.  Buys and sells.  Enjoys shopping    REVIEW OF SYSTEMS: Constitutional: No fevers, chills, or sweats, no generalized fatigue, change in appetite Eyes: No visual changes, double vision, eye pain Ear, nose and throat: No hearing loss, ear pain, nasal congestion, sore throat Cardiovascular: No chest pain, palpitations Respiratory:  No shortness of breath at rest or with exertion, wheezes GastrointestinaI: No nausea, vomiting, diarrhea, abdominal pain, fecal incontinence Genitourinary:  No dysuria, urinary retention or frequency Musculoskeletal:  No neck pain, +back pain Integumentary: No rash, pruritus, skin lesions Neurological: as above Psychiatric: No depression, insomnia,+ anxiety Endocrine: No palpitations, fatigue, diaphoresis, mood swings, change in appetite, change  in weight, increased thirst Hematologic/Lymphatic:  No anemia, purpura, petechiae. Allergic/Immunologic: no itchy/runny eyes, nasal congestion, recent allergic reactions, rashes  PHYSICAL EXAM: Vitals:   03/25/17 1040  BP: (!) 142/90  Pulse: 82  SpO2: 96%   General: No acute distress Head:  Normocephalic/atraumatic Eyes: Fundoscopic exam shows bilateral sharp discs, no vessel changes, exudates, or hemorrhages Neck: supple, no paraspinal tenderness, full range of motion Back: No paraspinal tenderness Heart: regular rate and rhythm Lungs: Clear to auscultation bilaterally. Vascular: No carotid bruits. Skin/Extremities: No rash, no edema Neurological Exam: Mental status: alert and oriented to person, place, and time, no dysarthria or aphasia, Fund of knowledge is appropriate.  Recent and remote memory are intact.  Attention and concentration are normal.    Able to name objects and repeat phrases.  Montreal  Cognitive Assessment  03/25/2017  Visuospatial/ Executive (0/5) 5  Naming (0/3) 3  Attention: Read list of digits (0/2) 2  Attention: Read list of letters (0/1) 1  Attention: Serial 7 subtraction starting at 100 (0/3) 3  Language: Repeat phrase (0/2) 2  Language : Fluency (0/1) 0  Abstraction (0/2) 2  Delayed Recall (0/5) 4  Orientation (0/6) 6  Total 28   Cranial nerves: CN I: not tested CN II: pupils equal, round and reactive to light, visual fields intact, fundi unremarkable. CN III, IV, VI:  full range of motion, no nystagmus, no ptosis CN V: facial sensation intact CN VII: upper and lower face symmetric CN VIII: hearing intact to finger rub CN IX, X: gag intact, uvula midline CN XI: sternocleidomastoid and trapezius muscles intact CN XII: tongue midline Bulk & Tone: normal, no fasciculations. Motor: 5/5 throughout with no pronator drift. Sensation: intact to light touch, cold, pin, vibration and joint position sense.  No extinction to double simultaneous stimulation.  Romberg test negative Deep Tendon Reflexes: +2 throughout, no ankle clonus Plantar responses: downgoing bilaterally Cerebellar: no incoordination on finger to nose, heel to shin. No dysdiadochokinesia Gait: narrow-based and steady, able to tandem walk adequately. Tremor: none  IMPRESSION: This is a pleasant 71 year old right-handed woman with a history of hyperlipidemia, depression, anxiety, presenting after she was noted to be confused in her PCP office last August 2018. She feels that she was just slow to respond to questions, but it appears that both PCP and ER staff noted confusion. Bloodwork and head CT were unremarkable. She lives alone and denies any significant confusion, but does not some short-term memory issues, worse with anxiety. Her MOCA score today is normal, 28/30. Neurological exam normal. We discussed different causes of cognitive complaints, check TSH and B12. MRI brain without contrast will be  ordered to assess for underlying structural abnormality. We discussed how anxiety can also cause memory issues, continue to monitor. She is on Celexa and Bupropion, and may benefit from psychotherapy. We discussed the importance of control of vascular risk factors, physical exercise, and brain stimulation exercises for brain health. She will follow-up in 6 months and knows to call for any changes.   Thank you for allowing me to participate in the care of this patient. Please do not hesitate to call for any questions or concerns.   Patrcia Dolly, M.D.  CC: Sandford Craze, NP

## 2017-03-30 ENCOUNTER — Telehealth: Payer: Self-pay

## 2017-03-30 NOTE — Telephone Encounter (Signed)
Spoke with pt relaying message below.   

## 2017-03-30 NOTE — Telephone Encounter (Signed)
PA approved through 03/25/2018.

## 2017-03-30 NOTE — Telephone Encounter (Signed)
-----   Message from Van ClinesKaren M Aquino, MD sent at 03/30/2017  2:14 PM EST ----- Pls let patient know thyroid and B12 levels are good, thanks

## 2017-03-31 ENCOUNTER — Encounter: Payer: Self-pay | Admitting: Neurology

## 2017-04-04 ENCOUNTER — Other Ambulatory Visit: Payer: Medicare Other

## 2017-04-04 DIAGNOSIS — Z79899 Other long term (current) drug therapy: Secondary | ICD-10-CM

## 2017-04-04 DIAGNOSIS — G8929 Other chronic pain: Secondary | ICD-10-CM

## 2017-04-04 DIAGNOSIS — G4700 Insomnia, unspecified: Secondary | ICD-10-CM

## 2017-04-04 NOTE — Telephone Encounter (Signed)
Talked to patient, she picked up medication, was scheduled to come in this afternoon for UDS

## 2017-04-06 LAB — PAIN MGMT, PROFILE 8 W/CONF, U
6 Acetylmorphine: NEGATIVE ng/mL (ref <10)
ALCOHOL METABOLITES: POSITIVE ng/mL — AB (ref <500)
AMPHETAMINE: NEGATIVE ng/mL (ref <250)
AMPHETAMINES: NEGATIVE ng/mL (ref <500)
BUPRENORPHINE, URINE: NEGATIVE ng/mL (ref <5)
Benzodiazepines: NEGATIVE ng/mL (ref <100)
COCAINE METABOLITE: NEGATIVE ng/mL (ref <150)
CREATININE: 131 mg/dL
Ethyl Glucuronide (ETG): 1443 ng/mL — ABNORMAL HIGH (ref <500)
Ethyl Sulfate (ETS): 410 ng/mL — ABNORMAL HIGH (ref <100)
MARIJUANA METABOLITE: NEGATIVE ng/mL (ref <20)
MDMA: NEGATIVE ng/mL (ref <500)
METHAMPHETAMINE: NEGATIVE ng/mL (ref <250)
OXIDANT: NEGATIVE ug/mL (ref <200)
Opiates: NEGATIVE ng/mL (ref <100)
Oxycodone: NEGATIVE ng/mL (ref <100)
PH: 6.14 (ref 4.5–9.0)

## 2017-04-07 ENCOUNTER — Ambulatory Visit
Admission: RE | Admit: 2017-04-07 | Discharge: 2017-04-07 | Disposition: A | Discharge status: Home / Self Care | Payer: Medicare Other | Source: Ambulatory Visit | Attending: Neurology | Admitting: Neurology

## 2017-04-07 DIAGNOSIS — R42 Dizziness and giddiness: Secondary | ICD-10-CM | POA: Diagnosis not present

## 2017-04-07 DIAGNOSIS — R413 Other amnesia: Secondary | ICD-10-CM

## 2017-04-20 ENCOUNTER — Telehealth: Payer: Self-pay | Admitting: Neurology

## 2017-04-20 NOTE — Telephone Encounter (Signed)
Discussed MRI results with patient. It would be unusual to be diagnosed with MS at this age, she does not have any clinical history of this. We discussed options of doing a lumbar puncture versus repeating brain imaging in 6 months. She would like to do repeat brain imaging in 6 months, and if more white matter changes, then we will consider spinal tap.   Judy Weiss, pls schedule MRI brain with and without contrast for June 2019, use diagnosis of abnormal brain MRI, then pls reschedule her next f/u with me for after the MRI brain. Thanks!!

## 2017-05-03 ENCOUNTER — Other Ambulatory Visit: Payer: Self-pay | Admitting: Family

## 2017-05-03 NOTE — Telephone Encounter (Signed)
Pantoprazole refills sent to pharmacy. Pt last seen by PCP 11/2016 and has no future appts scheduled. Please advise when pt should follow up in the office?

## 2017-05-04 ENCOUNTER — Other Ambulatory Visit: Payer: Self-pay

## 2017-05-04 DIAGNOSIS — R9089 Other abnormal findings on diagnostic imaging of central nervous system: Secondary | ICD-10-CM

## 2017-05-05 NOTE — Telephone Encounter (Signed)
Needs follow up please.

## 2017-05-06 NOTE — Telephone Encounter (Signed)
Please call pt to schedule appt with Melissa soon. Thanks!

## 2017-05-06 NOTE — Telephone Encounter (Signed)
Letter mailed to pt.  

## 2017-05-06 NOTE — Telephone Encounter (Signed)
Called 3 times and it got a busy signal each time.

## 2017-05-11 MED FILL — PANTOPRAZOLE SOD DR 40 MG T: 40 | 30 days supply | Qty: 60 | Fill #0

## 2017-05-26 MED FILL — CITALOPRAM HBR 40 MG TABLET: 40 | 90 days supply | Qty: 90 | Fill #1

## 2017-06-29 MED FILL — BUPROPION HCL XL 300 MG TAB: 300 | 90 days supply | Qty: 90 | Fill #1

## 2017-08-24 ENCOUNTER — Ambulatory Visit: Payer: Medicare Other | Admitting: Neurology

## 2017-08-24 ENCOUNTER — Other Ambulatory Visit: Payer: Self-pay

## 2017-08-24 ENCOUNTER — Encounter: Payer: Self-pay | Admitting: Neurology

## 2017-08-24 VITALS — BP 142/78 | HR 72 | Ht 63.0 in | Wt 195.0 lb

## 2017-08-24 DIAGNOSIS — R413 Other amnesia: Secondary | ICD-10-CM | POA: Diagnosis not present

## 2017-08-24 DIAGNOSIS — R9089 Other abnormal findings on diagnostic imaging of central nervous system: Secondary | ICD-10-CM | POA: Diagnosis not present

## 2017-08-24 NOTE — Progress Notes (Signed)
NEUROLOGY FOLLOW UP OFFICE NOTE  Yomaira Solar [id number] [birth year]  HISTORY OF PRESENT ILLNESS: I had the pleasure of seeing Faryn Sieg in follow-up in the neurology clinic on 08/24/2017.  The patient was last seen 6 months ago for transient amnesia. She is alone in the office today. Records and images were personally reviewed where available.  I personally reviewed MRI brain without contrast done 04/07/2017 which did not show any acute changes. There was extensive white matter changes noted in the pericallosal region, raising concern for demyelinating disease. Findings were discussed with the patient over the phone, including doing a spinal tap versus repeating imaging in 6 months. She denied any symptoms concerning for MS flare and opted to repeat imaging in 6 months. Since her initial visit, she reports doing better. She reports her anxiety is okay. She is planning a family trip to [place], and has found that little things stress her out, causing her to forget things. She denies getting lost driving but gets confused. She denies any missed bills or medications. She denies any vision changes/vision loss, focal numbness/tingling/weakness, bowel/bladder dysfunction, no falls.   HPI 03/25/2017: This is a pleasant 72 yo RH woman with a history of hyperlipidemia, depression, who presented for evaluation of transient amnesia. She reports that she had a regular PCP appointment with her PCP and was hesitant in answering questions, then she was "considered confused and sent to the ER." On review of PCP notes from 12/13/16, she reported "I just don't feel like myself today." She was noted to be confused, mildly tearful, oriented to year and place, unable to name president. She was clearly not herself. In there ER she reported trouble with short-term memory loss and trouble remembering what she wanted to talk to her PCP about. CBC, CMP, UDS were negative. Her urinalysis showed 0-5 WBC, moderate leukocytes. Head  CT no acute changes, there was mild diffuse atrophy and chronic microvascular disease. Nursing staff expressed concern about going home, she did not know her home address. She was adamant to be discharged home. She reports that her daughter has urged her to see a neurologist because she has noticed changes, but the patient cannot elaborate what her daughter's concerns are. She sometimes does feel a little confused, more with short-term memory difficulties. She denies getting lost driving, but sometimes forgets where she parked her car. She denies any missed bills or medications. She feels her mind is a little fuzzy and she is "just slow in answering." She missed her initial appointment in our office because she got so anxious that she could not find our office and got so confused. She is taking Celexa and Bupropion. She continues to buy and sell antiques. She had a fall in the past year, unsure if she passed out with it. She has occasional dizziness and has noticed a change in her walking, she is not as stable as before. She denies any focal numbness/tingling/weakness, no bowel/bladder dysfunction. She has low back pain. She denies any headache, diplopia, dysarthria, tremors, or anosmia. She has had some difficulty swallowing and has been diagnosed with an esophageal hernia. She underwent surgery but feels she has had more problems after surgery, she feels food wants to come up 2-3 hours after eating. She denies any staring/unresponsive episodes, gaps in time, denies any olfactory/gustatory hallucinations, deja vu, rising epigastric sensation, myoclonic jerks. She had a normal birth and early development.  There is no history of febrile convulsions, CNS infections such as meningitis/encephalitis, significant traumatic brain injury,  neurosurgical procedures, or family history of seizures.  PAST MEDICAL HISTORY: Past Medical History:  Diagnosis Date  . Allergy    food allergies and some additives. But she does  not know what will cause.  . Depression   . GERD (gastroesophageal reflux disease)    had in past but now controlled. Has hiatal hernia.  Marland Kitchen Heart murmur   . History of blood clots 1996?   left leg  . History of colon polyps   . History of hiatal hernia   . Hyperlipidemia     MEDICATIONS: Current Outpatient Medications on File Prior to Visit  Medication Sig Dispense Refill  . aspirin EC 81 MG tablet Take 1 tablet (81 mg total) by mouth daily. (Patient taking differently: Take 162 mg by mouth daily. Take 2 tablets by mouth daily.)    . buPROPion (WELLBUTRIN XL) 300 MG 24 hr tablet TAKE 1 TABLET (300 MG TOTAL) BY MOUTH EVERY MORNING. 90 tablet 1  . citalopram (CELEXA) 40 MG tablet TAKE 1 TABLET (40 MG TOTAL) BY MOUTH DAILY. 90 tablet 1  . pantoprazole (PROTONIX) 40 MG tablet TAKE 1 TABLET(40 MG) BY MOUTH TWICE DAILY 60 tablet 5  . zolpidem (AMBIEN) 10 MG tablet TAKE 1/2 TABLET BY MOUTH AT BEDTIME AS NEEDED FOR SLEEP 30 tablet 0   No current facility-administered medications on file prior to visit.     ALLERGIES: No Known Allergies  FAMILY HISTORY: Family History  Problem Relation Age of Onset  . Diabetes Mother   . Hypothyroidism Mother   . Seizures Mother     SOCIAL HISTORY: Social History   Socioeconomic History  . Marital status: Divorced    Spouse name: Not on file  . Number of children: Not on file  . Years of education: Not on file  . Highest education level: Not on file  Occupational History  . Not on file  Social Needs  . Financial resource strain: Not on file  . Food insecurity:    Worry: Not on file    Inability: Not on file  . Transportation needs:    Medical: Not on file    Non-medical: Not on file  Tobacco Use  . Smoking status: Never Smoker  . Smokeless tobacco: Never Used  Substance and Sexual Activity  . Alcohol use: Yes    Alcohol/week: 4.2 oz    Types: 7 Standard drinks or equivalent per week    Comment: pt  drinks one glass of hard cidar at  dinner each day  . Drug use: No  . Sexual activity: Never  Lifestyle  . Physical activity:    Days per week: Not on file    Minutes per session: Not on file  . Stress: Not on file  Relationships  . Social connections:    Talks on phone: Not on file    Gets together: Not on file    Attends religious service: Not on file    Active member of club or organization: Not on file    Attends meetings of clubs or organizations: Not on file    Relationship status: Not on file  . Intimate partner violence:    Fear of current or ex partner: Not on file    Emotionally abused: Not on file    Physically abused: Not on file    Forced sexual activity: Not on file  Other Topics Concern  . 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   Divorced   4 cats and a dog   Enjoys antiquing.  Buys and sells.  Enjoys shopping    REVIEW OF SYSTEMS: Constitutional: No fevers, chills, or sweats, no generalized fatigue, change in appetite Eyes: No visual changes, double vision, eye pain Ear, nose and throat: No hearing loss, ear pain, nasal congestion, sore throat Cardiovascular: No chest pain, palpitations Respiratory:  No shortness of breath at rest or with exertion, wheezes GastrointestinaI: No nausea, vomiting, diarrhea, abdominal pain, fecal incontinence Genitourinary:  No dysuria, urinary retention or frequency Musculoskeletal:  No neck pain, back pain Integumentary: No rash, pruritus, skin lesions Neurological: as above Psychiatric: No depression, insomnia, +anxiety Endocrine: No palpitations, fatigue, diaphoresis, mood swings, change in appetite, change in weight, increased thirst Hematologic/Lymphatic:  No anemia, purpura, petechiae. Allergic/Immunologic: no itchy/runny eyes, nasal  congestion, recent allergic reactions, rashes  PHYSICAL EXAM: Vitals:   08/24/17 1133  BP: (!) 142/78  Pulse: 72  SpO2: 97%   General: No acute distress Head:  Normocephalic/atraumatic Neck: supple, no paraspinal tenderness, full range of motion Heart:  Regular rate and rhythm Lungs:  Clear to auscultation bilaterally Back: No paraspinal tenderness Skin/Extremities: No rash, no edema Neurological Exam: alert and oriented to person, place, and time. No aphasia or dysarthria. Fund of knowledge is appropriate.  Recent and remote memory are intact.  Attention and concentration are normal.    Able to name objects and repeat phrases. CDT 5/5 MMSE - Mini Mental State Exam 08/24/2017  Orientation to time 5  Orientation to Place 5  Registration 3  Attention/ Calculation 4  Recall 2  Language- name 2 objects 2  Language- repeat 1  Language- follow 3 step command 3  Language- read & follow direction 1  Write a sentence 1  Copy design 1  Total score 28   Cranial nerves: Pupils equal, round, reactive to light.  Extraocular movements intact with no nystagmus. Visual fields full. Facial sensation intact. No facial asymmetry. Tongue, uvula, palate midline.  Motor: Bulk and tone normal, muscle strength 5/5 throughout with no pronator drift.  Sensation to light touch intact.  No extinction to double simultaneous stimulation.  Deep tendon reflexes 2+ throughout, toes downgoing.  Finger to nose testing intact.  Gait narrow-based and steady, able to tandem walk adequately.  Romberg negative.  IMPRESSION: This is a pleasant 72 yo RH woman with a history of hyperlipidemia, depression, anxiety, presenting after she was noted to be confused in her PCP office last August 2018. She felt that she was just slow to respond to questions, but it appears that both PCP and ER staff noted confusion. Bloodwork unremarkable. MOCA score in December 2018 was normal 28/30. She had reported short-term memory issues, worse  with anxiety. MMSE today normal 28/30. She reports feeling better since her last visit. Her MRI brain had shown extensive white matter changes, particularly in the pericallosal region, raising concern for demyelinating disease. She denies any symptoms suggestive of MS flares, neurological exam normal. She opted to hold off on lumbar puncture and repeat brain imaging in 6 months. MRI brain with and without contrast will be ordered for next month. She will be scheduled for Neurocognitive testing to further evaluate cognitive complaints. We again discussed the importance of control of vascular risk factors, physical exercise, and  brain stimulation exercises for brain health. She will follow-up in 6 months and knows to call for any changes.   Thank you for allowing me to participate in her care.  Please do not hesitate to call for any questions or concerns.  The duration of this appointment visit was 30 minutes of face-to-face time with the patient.  Greater than 50% of this time was spent in counseling, explanation of diagnosis, planning of further management, and coordination of care.   Patrcia Dolly, M.D.   CC: Sandford Craze, NP

## 2017-08-24 NOTE — Patient Instructions (Signed)
1. Schedule repeat MRI brain with and without contrast for next month 2. Schedule Neurocognitive testing 3. Follow-up after testing Have a great trip!!   RECOMMENDATIONS FOR ALL PATIENTS WITH MEMORY PROBLEMS: 1. Continue to exercise (Recommend 30 minutes of walking everyday, or 3 hours every week) 2. Increase social interactions - continue going to [place] and enjoy social gatherings with friends and family 3. Eat healthy, avoid fried foods and eat more fruits and vegetables 4. Maintain adequate blood pressure, blood sugar, and blood cholesterol level. Reducing the risk of stroke and cardiovascular disease also helps promoting better memory. 5. Avoid stressful situations. Live a simple life and avoid aggravations. Organize your time and prepare for the next day in anticipation. 6. Sleep well, avoid any interruptions of sleep and avoid any distractions in the bedroom that may interfere with adequate sleep quality 7. Avoid sugar, avoid sweets as there is a strong link between excessive sugar intake, diabetes, and cognitive impairment We discussed the Mediterranean diet, which has been shown to help patients reduce the risk of progressive memory disorders and reduces cardiovascular risk. This includes eating fish, eat fruits and green leafy vegetables, nuts like almonds and hazelnuts, walnuts, and also use olive oil. Avoid fast foods and fried foods as much as possible. Avoid sweets and sugar as sugar use has been linked to worsening of memory function.  There is always a concern of gradual progression of memory problems. If this is the case, then we may need to adjust level of care according to patient needs. Support, both to the patient and caregiver, should then be put into place.

## 2017-08-26 ENCOUNTER — Ambulatory Visit: Payer: Medicare Other | Admitting: Family

## 2017-08-29 ENCOUNTER — Encounter: Payer: Self-pay | Admitting: Family

## 2017-08-29 ENCOUNTER — Ambulatory Visit: Payer: Medicare Other | Admitting: Family

## 2017-08-29 ENCOUNTER — Telehealth: Payer: Self-pay | Admitting: Family

## 2017-08-29 VITALS — BP 131/75 | HR 70 | Temp 98.3°F | Resp 20 | Ht 63.0 in | Wt 198.6 lb

## 2017-08-29 DIAGNOSIS — F329 Major depressive disorder, single episode, unspecified: Secondary | ICD-10-CM | POA: Diagnosis not present

## 2017-08-29 DIAGNOSIS — Z79899 Other long term (current) drug therapy: Secondary | ICD-10-CM

## 2017-08-29 DIAGNOSIS — R1310 Dysphagia, unspecified: Secondary | ICD-10-CM

## 2017-08-29 DIAGNOSIS — K219 Gastro-esophageal reflux disease without esophagitis: Secondary | ICD-10-CM | POA: Diagnosis not present

## 2017-08-29 DIAGNOSIS — R413 Other amnesia: Secondary | ICD-10-CM

## 2017-08-29 DIAGNOSIS — G4700 Insomnia, unspecified: Secondary | ICD-10-CM

## 2017-08-29 DIAGNOSIS — F32A Depression, unspecified: Secondary | ICD-10-CM

## 2017-08-29 MED ORDER — BUPROPION HCL ER (XL) 300 MG PO TB24: 300.0000 mg | ORAL_TABLET | ORAL | 1 refills | Status: DC

## 2017-08-29 MED ORDER — ZOLPIDEM TARTRATE 10 MG PO TABS: ORAL_TABLET | ORAL | 0 refills | Status: DC

## 2017-08-29 MED ORDER — CITALOPRAM HYDROBROMIDE 40 MG PO TABS: 40.0000 mg | ORAL_TABLET | Freq: Every day | ORAL | 1 refills | Status: DC

## 2017-08-29 MED ORDER — PANTOPRAZOLE SODIUM 40 MG PO TBEC: DELAYED_RELEASE_TABLET | ORAL | 5 refills | Status: DC

## 2017-08-29 MED FILL — ZOLPIDEM TARTRATE 10 MG TAB: 10 | 60 days supply | Qty: 30 | Fill #0

## 2017-08-29 MED FILL — CITALOPRAM HBR 40 MG TABLET: 40 | 90 days supply | Qty: 90 | Fill #0

## 2017-08-29 MED FILL — PANTOPRAZOLE SOD DR 40 MG T: 40 | 30 days supply | Qty: 60 | Fill #0

## 2017-08-29 NOTE — Telephone Encounter (Signed)
Pt confirmed with me during her visit [facility] [place]. [facility]. Ambien was called verbally to [facility] [facility].

## 2017-08-29 NOTE — Progress Notes (Signed)
Subjective:    Patient ID: Judy Weiss, femaleAnaisa Radiecember 01, 1947, 72 y.o.   MRN: [id number]  HPI  Ms. Kath is a 72 yr old female who presents today for follow up.  1) GERD- maintained on protonix. She reports that it feels like her food is "not going down like it should."  Reports that she regurgitates her food contents several hours later.    2) Depression- maintained on citalopram and wellbutrin. She reports that her mood OK, notes that it is working and she can "tell when I don't take it."    3) Hyperlipidemia- not on statin.  Lab Results  Component Value Date   CHOL 187 07/06/2016   HDL 60.30 07/06/2016   LDLCALC 108 (H) 07/06/2016   TRIG 93.0 07/06/2016   CHOLHDL 3 07/06/2016    4) Insomnia- continues ambien prn and this usually helps her.    Memory Loss- following with neurology.   Denies SOB. Does report chronic  LE edema which she attributes to "circulation."  Review of Systems    see HPI  Past Medical History:  Diagnosis Date  . Allergy    food allergies and some additives. But she does not know what will cause.  . Depression   . GERD (gastroesophageal reflux disease)    had in past but now controlled. Has hiatal hernia.  Marland Kitchen Heart murmur   . History of blood clots 1996?   left leg  . History of colon polyps   . History of hiatal hernia   . Hyperlipidemia      Social History   Socioeconomic History  . Marital status: Divorced    Spouse name: Not on file  . Number of children: Not on file  . Years of education: Not on file  . Highest education level: Not on file  Occupational History  . Not on file  Social Needs  . Financial resource strain: Not on file  . Food insecurity:    Worry: Not on file    Inability: Not on file  . Transportation needs:    Medical: Not on file    Non-medical: Not on file  Tobacco Use  . Smoking status: Never Smoker  . Smokeless tobacco: Never Used  Substance and Sexual Activity  . Alcohol use: Yes   Alcohol/week: 4.2 oz    Types: 7 Standard drinks or equivalent per week    Comment: pt  drinks one glass of hard cidar at dinner each day  . Drug use: No  . Sexual activity: Never  Lifestyle  . Physical activity:    Days per week: Not on file    Minutes per session: Not on file  . Stress: Not on file  Relationships  . Social connections:    Talks on phone: Not on file    Gets together: Not on file    Attends religious service: Not on file    Active member of club or organization: Not on file    Attends meetings of clubs or organizations: Not on file    Relationship status: Not on file  . Intimate partner violence:    Fear of current or ex partner: Not on file    Emotionally abused: Not on file    Physically abused: Not on file    Forced sexual activity: Not on file  Other Topics Concern  . Not on file  Social History Narrative   Lives alone- can walk to her daughter's home   Daughter in  [place]   Son in [place]   Son [place]   4 grandchildren (twin girls age 76)   Retired- Presenter, broadcasting at a Associate Professor x 25 years.   Divorced   4 cats and a dog   Enjoys antiquing.  Buys and sells.  Enjoys shopping    Past Surgical History:  Procedure Laterality Date  . APPENDECTOMY  1972  . DILATION AND CURETTAGE OF UTERUS    . INSERTION OF MESH N/A 01/09/2016   Procedure: INSERTION OF MESH;  Surgeon: Axel Filler, MD;  Location: [facility];  Service: General;  Laterality: N/A;  . KNEE SURGERY Bilateral    hx of torn meniscus (arthroscopic surgery)  . LEG SURGERY Left 2001   Had veins stripped   . SHOULDER SURGERY Right    reports hx arthroscopic surgery    Family History  Problem Relation Age of Onset  . Diabetes Mother   . Hypothyroidism Mother   . Seizures Mother     No Known Allergies  Current Outpatient Medications on File Prior to Visit  Medication Sig Dispense Refill  . aspirin EC 81 MG tablet Take 1 tablet (81 mg total) by mouth daily. (Patient  taking differently: Take 162 mg by mouth daily. Take 2 tablets by mouth daily.)     No current facility-administered medications on file prior to visit.     BP 131/75 (BP Location: Right Arm, Cuff Size: Large)   Pulse 70   Temp 98.3 F (36.8 C) (Oral)   Resp 20   Ht  (1.6 m)   Wt 198 lb 9.6 oz (90.1 kg)   SpO2 98%   BMI 35.18 kg/m    Objective:   Physical Exam  Constitutional: She is oriented to person, place, and time. She appears well-developed and well-nourished.  HENT:  Head: Normocephalic and atraumatic.  Neck: Neck supple.  Cardiovascular: Normal rate, regular rhythm and normal heart sounds.  No murmur heard. Pulmonary/Chest: Effort normal and breath sounds normal. No respiratory distress. She has no wheezes.  Musculoskeletal:  3+ bilateral LE edema.  Neurological: She is alert and oriented to person, place, and time.  Skin: Skin is warm and dry.  Psychiatric: She has a normal mood and affect. Her behavior is normal. Judgment and thought content normal.          Assessment & Plan:  GERD/Dysphagia- uncontrolled despite bid protonix.  Will refer to GI for further evaluation.  Memory loss- work up ongoing, management per neurology.  Depression- stable on current meds. Continue same.   Hyperlipidemia- last cholesterol was OK. Continue low cholesterol diet, monitor.   Insomnia- continues ambien prn.  A controlled substance contract is signed today. Advised her I would continue to dispense #30 tabs at her request but told her that this is intended to be a 2 month supply. A UDS will be obtained today.

## 2017-08-29 NOTE — Telephone Encounter (Signed)
Pt came in office stating that was seen today with Melissa and forgot to mentioned that she only wanted to have [facility] as her main Pharmacy, pt wants to know if you can [facility]. Please advise.

## 2017-08-29 NOTE — Patient Instructions (Signed)
Please complete lab work prior to leaving.  You should be contacted about your referral to GI.  

## 2017-08-29 NOTE — Addendum Note (Signed)
Addended by: Mervin Kung A on: 08/29/2017 04:04 PM   Modules accepted: Orders

## 2017-09-01 ENCOUNTER — Encounter: Payer: Self-pay | Admitting: Neurology

## 2017-09-05 LAB — PAIN MGMT, PROFILE 8 W/CONF, U
6 ACETYLMORPHINE: NEGATIVE ng/mL (ref <10)
AMPHETAMINES: NEGATIVE ng/mL (ref <500)
Alcohol Metabolites: POSITIVE ng/mL — AB (ref <500)
Benzodiazepines: NEGATIVE ng/mL (ref <100)
Buprenorphine, Urine: NEGATIVE ng/mL (ref <5)
Cocaine Metabolite: NEGATIVE ng/mL (ref <150)
Creatinine: 121.6 mg/dL
ETHYL GLUCURONIDE (ETG): 2810 ng/mL — AB (ref <500)
Ethyl Sulfate (ETS): 540 ng/mL — ABNORMAL HIGH (ref <100)
MARIJUANA METABOLITE: NEGATIVE ng/mL (ref <20)
MDMA: NEGATIVE ng/mL (ref <500)
OXYCODONE: NEGATIVE ng/mL (ref <100)
Opiates: NEGATIVE ng/mL (ref <100)
Oxidant: NEGATIVE ug/mL (ref <200)
pH: 6 (ref 4.5–9.0)

## 2017-09-28 ENCOUNTER — Ambulatory Visit
Admission: RE | Admit: 2017-09-28 | Discharge: 2017-09-28 | Disposition: A | Discharge status: Home / Self Care | Payer: Medicare Other | Source: Ambulatory Visit | Attending: Neurology | Admitting: Neurology

## 2017-09-28 DIAGNOSIS — R9089 Other abnormal findings on diagnostic imaging of central nervous system: Secondary | ICD-10-CM

## 2017-09-28 DIAGNOSIS — R413 Other amnesia: Secondary | ICD-10-CM

## 2017-09-28 DIAGNOSIS — R410 Disorientation, unspecified: Secondary | ICD-10-CM | POA: Diagnosis not present

## 2017-09-28 MED ORDER — GADOBENATE DIMEGLUMINE 529 MG/ML IV SOLN
18.0000 mL | Freq: Once | INTRAVENOUS | Status: AC | PRN
  Administered 2017-09-28: 18 mL via INTRAVENOUS

## 2017-09-30 ENCOUNTER — Telehealth: Payer: Self-pay

## 2017-09-30 NOTE — Telephone Encounter (Signed)
-----   Message from Van ClinesKaren M Aquino, MD sent at 09/30/2017  9:07 AM EDT ----- Pls let her know the MRI brain did not show any new changes from prior, which is good. If any changes in symptoms, let us know. Thanks

## 2017-09-30 NOTE — Telephone Encounter (Signed)
Spoke with pt relaying message below.   

## 2017-10-03 NOTE — Progress Notes (Signed)
Subjective:   Judy Weiss is a 72 y.o. female who presents for an Initial Medicare Annual Wellness Visit. 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  Review of Systems   No ROS.  Medicare Wellness Visit. Additional risk factors are reflected in the social history. Cardiac Risk Factors include: advanced age (>4255men, 71>65 women);dyslipidemia Sleep patterns: Home Safety/Smoke Alarms: Feels safe in home. Smoke alarms in place.  Living environment; residence and Firearm Safety: Lives alone. 2 story home. Has pets. Divorced. Daughter lives within walking distance.   Female:         Mammo-  Done today    Dexa scan-  Ordered      [facility]- pt declines today    Objective:    Today's Vitals   10/06/17 1615  BP: 124/72  Pulse: 60  SpO2: 96%  Weight: 195 lb 12.8 oz (88.8 kg)  Height: 5\' 3"  (1.6 m)   Body mass index is 34.68 kg/m.  Advanced Directives 10/06/2017 12/13/2016 01/09/2016 01/06/2016  Does Patient Have a Medical Advance Directive? No No No No  Would patient like information on creating a medical advance directive? Yes (MAU/Ambulatory/Procedural Areas - Information given) - No - patient declined information No - patient declined information    Current Medications (verified) Outpatient Encounter Medications as of 10/06/2017  Medication Sig  . aspirin EC 81 MG tablet Take 1 tablet (81 mg total) by mouth daily. (Patient taking differently: Take 162 mg by mouth daily. Take 2 tablets by mouth daily.)  . buPROPion (WELLBUTRIN XL) 300 MG 24 hr tablet Take 1 tablet (300 mg total) by mouth every morning.  . citalopram (CELEXA) 40 MG tablet Take 1 tablet (40 mg total) by mouth daily.  . pantoprazole (PROTONIX) 40 MG tablet Take 1 tab by mouth twice daily  . zolpidem (AMBIEN) 10 MG tablet TAKE 1/2 TABLET BY MOUTH AT BEDTIME AS NEEDED FOR SLEEP   No facility-administered encounter medications on  file as of 10/06/2017.     Allergies (verified) Patient has no known allergies.   History: Past Medical History:  Diagnosis Date  . Allergy    food allergies and some additives. But she does not know what will cause.  . Depression   . GERD (gastroesophageal reflux disease)    had in past but now controlled. Has hiatal hernia.  Marland Kitchen. Heart murmur   . History of blood clots 1996?   left leg  . History of colon polyps   . History of hiatal hernia   . Hyperlipidemia    Past Surgical History:  Procedure Laterality Date  . APPENDECTOMY  1972  . DILATION AND CURETTAGE OF UTERUS    . INSERTION OF MESH N/A 01/09/2016   Procedure: INSERTION OF MESH;  Surgeon: Axel FillerArmando Ramirez, MD;  Location: [facility];  Service: General;  Laterality: N/A;  . KNEE SURGERY Bilateral    hx of torn meniscus (arthroscopic surgery)  . LEG SURGERY Left 2001   Had veins stripped   . SHOULDER SURGERY Right    reports hx arthroscopic surgery   Family History  Problem Relation Age of Onset  . Diabetes Mother   . Hypothyroidism Mother   . Seizures Mother    Social History   Socioeconomic History  . Marital status: Divorced    Spouse name: Not on file  . Number of children: Not on file  . Years  of education: Not on file  . Highest education level: Not on file  Occupational History  . Not on file  Social Needs  . Financial resource strain: Not on file  . Food insecurity:    Worry: Not on file    Inability: Not on file  . Transportation needs:    Medical: Not on file    Non-medical: Not on file  Tobacco Use  . Smoking status: Never Smoker  . Smokeless tobacco: Never Used  Substance and Sexual Activity  . Alcohol use: Yes    Alcohol/week: 4.2 oz    Types: 7 Standard drinks or equivalent per week    Comment: pt  drinks one glass of hard cidar at dinner each day  . Drug use: No  . Sexual activity: Never  Lifestyle  . Physical activity:    Days per week: Not on file    Minutes per session: Not on file   . Stress: Not on file  Relationships  . Social connections:    Talks on phone: Not on file    Gets together: Not on file    Attends religious service: Not on file    Active member of club or organization: Not on file    Attends meetings of clubs or organizations: Not on file    Relationship status: Not on file  Other Topics Concern  . Not on file  Social History Narrative   Lives alone- can walk to her daughter's home   Daughter in [place]   Son in [place]   Son [place]   4 grandchildren (twin girls age 63)   Retired- Presenter, broadcasting at a Associate Professor x 25 years.   Divorced   4 cats and a dog   Enjoys antiquing.  Buys and sells.  Enjoys shopping    Tobacco Counseling Counseling given: Not Answered   Clinical Intake:     Pain : No/denies pain                  Activities of Daily Living In your present state of health, do you have any difficulty performing the following activities: 10/06/2017  Hearing? Y  Comment Pt has hearing aids. Not wearing them.  Vision? N  Comment wearing glasses. Eye exams as needed.   Difficulty concentrating or making decisions? N  Walking or climbing stairs? N  Dressing or bathing? N  Doing errands, shopping? N  Preparing Food and eating ? N  Using the Toilet? N  In the past six months, have you accidently leaked urine? N  Do you have problems with loss of bowel control? N  Managing your Medications? N  Managing your Finances? N  Housekeeping or managing your Housekeeping? N  Some recent data might be hidden     Immunizations and Health Maintenance  There is no immunization history on file for this patient. Health Maintenance Due  Topic Date Due  . Hepatitis C Screening  21-Jun-1945  . TETANUS/TDAP  05/08/1962  . DEXA SCAN  05/08/2008  . PNA vac Low Risk Adult (1 of 2 - PCV13) 05/08/2008  . COLONOSCOPY  05/02/2015    Patient Care Team: Sandford Craze, NP as PCP - General (Internal  Medicine)  Indicate any recent Medical Services you may have received from other than [facility] providers in the past year (date may be approximate).     Assessment:   This is a routine wellness examination for Judy Weiss. Physical assessment deferred to PCP.  Hearing/Vision screen  Visual Acuity Screening   Right eye Left eye Both eyes  Without correction:     With correction: 20/20 20/20 20/20   Hearing Screening Comments: Pt recently got hearing aids but did not wear them today.  Dietary issues and exercise activities discussed: Current Exercise Habits: Home exercise routine, Type of exercise: walking, Time (Minutes): 15, Frequency (Times/Week): 7, Weekly Exercise (Minutes/Week): 105, Intensity: Mild, Exercise limited by: None identified Diet (meal preparation, eat out, water intake, caffeinated beverages, dairy products, fruits and vegetables): well balanced       Goals    . DIET - EAT MORE FRUITS AND VEGETABLES    . DIET - INCREASE WATER INTAKE      Depression Screen PHQ 2/9 Scores 10/06/2017 12/13/2016 09/09/2015 02/20/2015 02/20/2015  PHQ - 2 Score 1 2 0 0 0  PHQ- 9 Score - 6 - - -    Fall Risk Fall Risk  10/06/2017 08/24/2017 03/25/2017 12/13/2016 12/13/2016  Falls in the past year? No No No No No  Number falls in past yr: - - - - -  Injury with Fall? - - - - -  Risk for fall due to : - - - - Other (Comment)    Cognitive Function: MMSE - Mini Mental State Exam 10/06/2017 08/24/2017  Orientation to time 5 5  Orientation to Place 5 5  Registration 3 3  Attention/ Calculation 5 4  Recall 3 2  Language- name 2 objects 2 2  Language- repeat 1 1  Language- follow 3 step command 3 3  Language- read & follow direction 1 1  Write a sentence 1 1  Copy design 1 1  Total score 30 28   Montreal Cognitive Assessment  03/25/2017  Visuospatial/ Executive (0/5) 5  Naming (0/3) 3  Attention: Read list of digits (0/2) 2  Attention: Read list of letters (0/1) 1  Attention: Serial 7 subtraction  starting at 100 (0/3) 3  Language: Repeat phrase (0/2) 2  Language : Fluency (0/1) 0  Abstraction (0/2) 2  Delayed Recall (0/5) 4  Orientation (0/6) 6  Total 28      Screening Tests Health Maintenance  Topic Date Due  . Hepatitis C Screening  [birth year]  . TETANUS/TDAP  05/08/1962  . DEXA SCAN  05/08/2008  . PNA vac Low Risk Adult (1 of 2 - PCV13) 05/08/2008  . COLONOSCOPY  05/02/2015  . INFLUENZA VACCINE  12/18/2017 (Originally 11/17/2017)  . MAMMOGRAM  08/30/2018 (Originally 05/20/2017)     Plan:    Please schedule your next medicare wellness visit with me in 1 yr.  Continue to eat heart healthy diet (full of fruits, vegetables, whole grains, lean protein, water--limit salt, fat, and sugar intake) and increase physical activity as tolerated.  Continue doing brain stimulating activities (puzzles, reading, adult coloring books, staying active) to keep memory sharp.   Bring a copy of your living will and/or healthcare power of attorney to your next office visit.  I have ordered your Bone density scan. Please schedule.  Try sparkling water if regular water doesn't set well on your stomach.  I have personally reviewed and noted the following in the patient's chart:   . Medical and social history . Use of alcohol, tobacco or illicit drugs  . Current medications and supplements . Functional ability and status . Nutritional status . Physical activity . Advanced directives . List of other physicians . Hospitalizations, surgeries, and ER visits in previous 12 months . Vitals . Screenings to include cognitive, depression,  and falls . Referrals and appointments  In addition, I have reviewed and discussed with patient certain preventive protocols, quality metrics, and best practice recommendations. A written personalized care plan for preventive services as well as general preventive health recommendations were provided to patient.     [place], [place]   10/06/2017

## 2017-10-04 ENCOUNTER — Telehealth: Payer: Self-pay | Admitting: *Deleted

## 2017-10-04 DIAGNOSIS — Z1239 Encounter for other screening for malignant neoplasm of breast: Secondary | ICD-10-CM

## 2017-10-04 NOTE — Telephone Encounter (Signed)
Copied from CRM [id number]. Topic: Appointment Scheduling - Prior Auth Required for Appointment >> Oct 04, 2017 10:36 AM Jolayne Hainesaylor, Brittany L wrote: Patient is requesting to have a mammogram. She said she is wanting to do it on the same day as her awv on 6/20. Call back @ [phone]

## 2017-10-04 NOTE — Telephone Encounter (Signed)
Spoke with pt and provided her with Radiology # to call and schedule mammogram.

## 2017-10-05 ENCOUNTER — Ambulatory Visit (HOSPITAL_BASED_OUTPATIENT_CLINIC_OR_DEPARTMENT_OTHER): Payer: Medicare Other

## 2017-10-06 ENCOUNTER — Encounter: Payer: Self-pay | Admitting: *Deleted

## 2017-10-06 ENCOUNTER — Ambulatory Visit (HOSPITAL_BASED_OUTPATIENT_CLINIC_OR_DEPARTMENT_OTHER)
Admission: RE | Admit: 2017-10-06 | Discharge: 2017-10-06 | Disposition: A | Discharge status: Home / Self Care | Payer: Medicare Other | Source: Ambulatory Visit | Attending: Family | Admitting: Family

## 2017-10-06 ENCOUNTER — Ambulatory Visit (INDEPENDENT_AMBULATORY_CARE_PROVIDER_SITE_OTHER): Payer: Medicare Other | Admitting: *Deleted

## 2017-10-06 VITALS — BP 124/72 | HR 60 | Ht 63.0 in | Wt 195.8 lb

## 2017-10-06 DIAGNOSIS — Z0000 Encounter for general adult medical examination without abnormal findings: Secondary | ICD-10-CM

## 2017-10-06 DIAGNOSIS — Z1239 Encounter for other screening for malignant neoplasm of breast: Secondary | ICD-10-CM

## 2017-10-06 DIAGNOSIS — Z1231 Encounter for screening mammogram for malignant neoplasm of breast: Secondary | ICD-10-CM | POA: Diagnosis not present

## 2017-10-06 DIAGNOSIS — Z780 Asymptomatic menopausal state: Secondary | ICD-10-CM

## 2017-10-06 NOTE — Patient Instructions (Signed)
Please schedule your next medicare wellness visit with me in 1 yr.  Continue to eat heart healthy diet (full of fruits, vegetables, whole grains, lean protein, water--limit salt, fat, and sugar intake) and increase physical activity as tolerated.  Continue doing brain stimulating activities (puzzles, reading, adult coloring books, staying active) to keep memory sharp.   Bring a copy of your living will and/or healthcare power of attorney to your next office visit.  I have ordered your Bone density scan. Please schedule.  Try sparkling water if regular water doesn't set well on your stomach.   Judy Weiss , Thank you for taking time to come for your Medicare Wellness Visit. I appreciate your ongoing commitment to your health goals. Please review the following plan we discussed and let me know if I can assist you in the future.   These are the goals we discussed: Goals    . DIET - EAT MORE FRUITS AND VEGETABLES    . DIET - INCREASE WATER INTAKE       This is a list of the screening recommended for you and due dates:  Health Maintenance  Topic Date Due  .  Hepatitis C: One time screening is recommended by Center for Disease Control  (CDC) for  adults born from [birth year] through 1967.   07-Mar-1946  . Tetanus Vaccine  05/08/1962  . DEXA scan (bone density measurement)  05/08/2008  . Pneumonia vaccines (1 of 2 - PCV13) 05/08/2008  . Colon Cancer Screening  05/02/2015  . Flu Shot  12/18/2017*  . Mammogram  08/30/2018*  *Topic was postponed. The date shown is not the original due date.    Health Maintenance for Postmenopausal Women Menopause is a normal process in which your reproductive ability comes to an end. This process happens gradually over a span of months to years, usually between the ages of 38 and 54. Menopause is complete when you have missed 12 consecutive menstrual periods. It is important to talk with your health care provider about some of the most common conditions that  affect postmenopausal women, such as heart disease, cancer, and bone loss (osteoporosis). Adopting a healthy lifestyle and getting preventive care can help to promote your health and wellness. Those actions can also lower your chances of developing some of these common conditions. What should I know about menopause? During menopause, you may experience a number of symptoms, such as:  Moderate-to-severe hot flashes.  Night sweats.  Decrease in sex drive.  Mood swings.  Headaches.  Tiredness.  Irritability.  Memory problems.  Insomnia.  Choosing to treat or not to treat menopausal changes is an individual decision that you make with your health care provider. What should I know about hormone replacement therapy and supplements? Hormone therapy products are effective for treating symptoms that are associated with menopause, such as hot flashes and night sweats. Hormone replacement carries certain risks, especially as you become older. If you are thinking about using estrogen or estrogen with progestin treatments, discuss the benefits and risks with your health care provider. What should I know about heart disease and stroke? Heart disease, heart attack, and stroke become more likely as you age. This may be due, in part, to the hormonal changes that your body experiences during menopause. These can affect how your body processes dietary fats, triglycerides, and cholesterol. Heart attack and stroke are both medical emergencies. There are many things that you can do to help prevent heart disease and stroke:  Have your blood pressure checked  at least every 1-2 years. High blood pressure causes heart disease and increases the risk of stroke.  If you are 49-56 years old, ask your health care provider if you should take aspirin to prevent a heart attack or a stroke.  Do not use any tobacco products, including cigarettes, chewing tobacco, or electronic cigarettes. If you need help quitting, ask  your health care provider.  It is important to eat a healthy diet and maintain a healthy weight. ? Be sure to include plenty of vegetables, fruits, low-fat dairy products, and lean protein. ? Avoid eating foods that are high in solid fats, added sugars, or salt (sodium).  Get regular exercise. This is one of the most important things that you can do for your health. ? Try to exercise for at least 150 minutes each week. The type of exercise that you do should increase your heart rate and make you sweat. This is known as moderate-intensity exercise. ? Try to do strengthening exercises at least twice each week. Do these in addition to the moderate-intensity exercise.  Know your numbers.Ask your health care provider to check your cholesterol and your blood glucose. Continue to have your blood tested as directed by your health care provider.  What should I know about cancer screening? There are several types of cancer. Take the following steps to reduce your risk and to catch any cancer development as early as possible. Breast Cancer  Practice breast self-awareness. ? This means understanding how your breasts normally appear and feel. ? It also means doing regular breast self-exams. Let your health care provider know about any changes, no matter how small.  If you are 52 or older, have a clinician do a breast exam (clinical breast exam or CBE) every year. Depending on your age, family history, and medical history, it may be recommended that you also have a yearly breast X-ray (mammogram).  If you have a family history of breast cancer, talk with your health care provider about genetic screening.  If you are at high risk for breast cancer, talk with your health care provider about having an MRI and a mammogram every year.  Breast cancer (BRCA) gene test is recommended for women who have family members with BRCA-related cancers. Results of the assessment will determine the need for genetic  counseling and BRCA1 and for BRCA2 testing. BRCA-related cancers include these types: ? Breast. This occurs in males or females. ? Ovarian. ? Tubal. This may also be called fallopian tube cancer. ? Cancer of the abdominal or pelvic lining (peritoneal cancer). ? Prostate. ? Pancreatic.  Cervical, Uterine, and Ovarian Cancer Your health care provider may recommend that you be screened regularly for cancer of the pelvic organs. These include your ovaries, uterus, and vagina. This screening involves a pelvic exam, which includes checking for microscopic changes to the surface of your cervix (Pap test).  For women ages 21-65, health care providers may recommend a pelvic exam and a Pap test every three years. For women ages 54-65, they may recommend the Pap test and pelvic exam, combined with testing for human papilloma virus (HPV), every five years. Some types of HPV increase your risk of cervical cancer. Testing for HPV may also be done on women of any age who have unclear Pap test results.  Other health care providers may not recommend any screening for nonpregnant women who are considered low risk for pelvic cancer and have no symptoms. Ask your health care provider if a screening pelvic exam is  right for you.  If you have had past treatment for cervical cancer or a condition that could lead to cancer, you need Pap tests and screening for cancer for at least 20 years after your treatment. If Pap tests have been discontinued for you, your risk factors (such as having a new sexual partner) need to be reassessed to determine if you should start having screenings again. Some women have medical problems that increase the chance of getting cervical cancer. In these cases, your health care provider may recommend that you have screening and Pap tests more often.  If you have a family history of uterine cancer or ovarian cancer, talk with your health care provider about genetic screening.  If you have  vaginal bleeding after reaching menopause, tell your health care provider.  There are currently no reliable tests available to screen for ovarian cancer.  Lung Cancer Lung cancer screening is recommended for adults 74-45 years old who are at high risk for lung cancer because of a history of smoking. A yearly low-dose CT scan of the lungs is recommended if you:  Currently smoke.  Have a history of at least 30 pack-years of smoking and you currently smoke or have quit within the past 15 years. A pack-year is smoking an average of one pack of cigarettes per day for one year.  Yearly screening should:  Continue until it has been 15 years since you quit.  Stop if you develop a health problem that would prevent you from having lung cancer treatment.  Colorectal Cancer  This type of cancer can be detected and can often be prevented.  Routine colorectal cancer screening usually begins at age 54 and continues through age 73.  If you have risk factors for colon cancer, your health care provider may recommend that you be screened at an earlier age.  If you have a family history of colorectal cancer, talk with your health care provider about genetic screening.  Your health care provider may also recommend using home test kits to check for hidden blood in your stool.  A small camera at the end of a tube can be used to examine your colon directly (sigmoidoscopy or colonoscopy). This is done to check for the earliest forms of colorectal cancer.  Direct examination of the colon should be repeated every 5-10 years until age 24. However, if early forms of precancerous polyps or small growths are found or if you have a family history or genetic risk for colorectal cancer, you may need to be screened more often.  Skin Cancer  Check your skin from head to toe regularly.  Monitor any moles. Be sure to tell your health care provider: ? About any new moles or changes in moles, especially if there is a  change in a mole's shape or color. ? If you have a mole that is larger than the size of a pencil eraser.  If any of your family members has a history of skin cancer, especially at a young age, talk with your health care provider about genetic screening.  Always use sunscreen. Apply sunscreen liberally and repeatedly throughout the day.  Whenever you are outside, protect yourself by wearing long sleeves, pants, a wide-brimmed hat, and sunglasses.  What should I know about osteoporosis? Osteoporosis is a condition in which bone destruction happens more quickly than new bone creation. After menopause, you may be at an increased risk for osteoporosis. To help prevent osteoporosis or the bone fractures that can happen because of  osteoporosis, the following is recommended:  If you are 53-42 years old, get at least 1,000 mg of calcium and at least 600 mg of vitamin D per day.  If you are older than age 70 but younger than age 60, get at least 1,200 mg of calcium and at least 600 mg of vitamin D per day.  If you are older than age 91, get at least 1,200 mg of calcium and at least 800 mg of vitamin D per day.  Smoking and excessive alcohol intake increase the risk of osteoporosis. Eat foods that are rich in calcium and vitamin D, and do weight-bearing exercises several times each week as directed by your health care provider. What should I know about how menopause affects my mental health? Depression may occur at any age, but it is more common as you become older. Common symptoms of depression include:  Low or sad mood.  Changes in sleep patterns.  Changes in appetite or eating patterns.  Feeling an overall lack of motivation or enjoyment of activities that you previously enjoyed.  Frequent crying spells.  Talk with your health care provider if you think that you are experiencing depression. What should I know about immunizations? It is important that you get and maintain your immunizations.  These include:  Tetanus, diphtheria, and pertussis (Tdap) booster vaccine.  Influenza every year before the flu season begins.  Pneumonia vaccine.  Shingles vaccine.  Your health care provider may also recommend other immunizations. This information is not intended to replace advice given to you by your health care provider. Make sure you discuss any questions you have with your health care provider. Document Released: 05/28/2005 Document Revised: 10/24/2015 Document Reviewed: 01/07/2015 Elsevier Interactive Patient Education  2018 [employer].

## 2017-10-06 NOTE — Progress Notes (Signed)
RN note reviewed and agree.  Beila Purdie S O'Sullivan NP 

## 2017-10-11 ENCOUNTER — Ambulatory Visit (INDEPENDENT_AMBULATORY_CARE_PROVIDER_SITE_OTHER): Payer: Medicare Other | Admitting: Gastroenterology

## 2017-10-11 ENCOUNTER — Encounter: Payer: Self-pay | Admitting: Gastroenterology

## 2017-10-11 VITALS — BP 128/78 | HR 78 | Ht 63.0 in | Wt 194.1 lb

## 2017-10-11 DIAGNOSIS — R1310 Dysphagia, unspecified: Secondary | ICD-10-CM

## 2017-10-11 DIAGNOSIS — K219 Gastro-esophageal reflux disease without esophagitis: Secondary | ICD-10-CM

## 2017-10-11 MED ORDER — PANTOPRAZOLE SODIUM 40 MG PO TBEC: 40.0000 mg | DELAYED_RELEASE_TABLET | Freq: Two times a day (BID) | ORAL | 3 refills | Status: DC

## 2017-10-11 MED FILL — PANTOPRAZOLE SOD DR 40 MG T: 40 | 30 days supply | Qty: 60 | Fill #0

## 2017-10-11 NOTE — Patient Instructions (Signed)
If you are age 74 or older, your body mass index should be between 23-30. Your Body mass index is 34.39 kg/m. If this is out of the aforementioned range listed, please consider follow up with your Primary Care Provider.  If you are age 74 or younger, your body mass index should be between 19-25. Your Body mass index is 34.39 kg/m. If this is out of the aformentioned range listed, please consider follow up with your Primary Care Provider.   We have sent the following medications to your pharmacy for you to pick up at your convenience:   You have been scheduled for an endoscopy. Please follow written instructions given to you at your visit today. If you use inhalers (even only as needed), please bring them with you on the day of your procedure. Your physician has requested that you go to www.startemmi.com and enter the access code given to you at your visit today. This web site gives a general overview about your procedure. However, you should still follow specific instructions given to you by our office regarding your preparation for the procedure.  You have been [facility] on 10/18/2017 at 9:30am. Please arrive 15 minutes prior to your appointment for registration. Make certain not to have anything to eat or drink 6 hours prior to your test. If you need to reschedule for any reason, please contact radiology at [phone] to do so. __________________________________________________________________ A barium swallow is an examination that concentrates on views of the esophagus. This tends to be a double contrast exam (barium and two liquids which, when combined, create a gas to distend the wall of the oesophagus) or single contrast (non-ionic iodine based). The study is usually tailored to your symptoms so a good history is essential. Attention is paid during the study to the form, structure and configuration of the esophagus,  looking for functional disorders (such as aspiration, dysphagia, achalasia, motility and reflux) EXAMINATION You may be asked to change into a gown, depending on the type of swallow being performed. A radiologist and radiographer will perform the procedure. The radiologist will advise you of the type of contrast selected for your procedure and direct you during the exam. You will be asked to stand, sit or lie in several different positions and to hold a small amount of fluid in your mouth before being asked to swallow while the imaging is performed .In some instances you may be asked to swallow barium coated marshmallows to assess the motility of a solid food bolus. The exam can be recorded as a digital or video fluoroscopy procedure. POST PROCEDURE It will take 1-2 days for the barium to pass through your system. To facilitate this, it is important, unless otherwise directed, to increase your fluids for the next 24-48hrs and to resume your normal diet.  This test typically takes about 30 minutes to perform. ______________________________________________________________________________  Thank you,  Dr. Lynann Bolognaajesh Gupta

## 2017-10-11 NOTE — Progress Notes (Signed)
Chief Complaint: Esophageal dysphagia  Referring Provider:  Sandford Craze, NP      ASSESSMENT AND PLAN;   #1. GERD with history of hiatal hernia status post Nissen's fundoplication 2017 now with esophageal dysphagia. Differential diagnoses includes esophageal stricture, motility disorder, eosinophilic esophagitis, pill induced esophagitis, rule out esophageal carcinoma or extrinsic lesions. Could have tight Nissen's fundoplication with acquired achalasia-like condition.  Plan: -Increase Protonix to 40mg  po bid -Proceed with Ba swallow with barium tablet as well. -Recommend EGD with dilatation thereafter. -Nonpharmacologic means of reflux control were also stressed.  I have instructed patient that she needs to chew meats and breads well and eat slowly.   #2.  Colorectal cancer screening -Have discussed regarding colonoscopy.  She would like to wait until her upper GI symptoms are better.  She does not believe that she could tolerate preparation at this time.   HPI:    Judy Weiss is a 72 y.o. female  S/p HH repiar 2017 due to large hiatal hernia. Had dysphagia ever since which has been progressively getting worse. Now also having problems with liquids. No weight loss, melena, hematemesis or significant abdominal bloating. She would occasionally have epigastric discomfort No nonsteroidals Has been given Protonix 40 mg p.o. once a day with some relief. Has appointment with GI to get further evaluation.    Past Medical History:  Diagnosis Date  . Allergy    food allergies and some additives. But she does not know what will cause.  . Depression   . GERD (gastroesophageal reflux disease)    had in past but now controlled. Has hiatal hernia.  Marland Kitchen Heart murmur   . History of blood clots 1996?   left leg  . History of colon polyps   . History of hiatal hernia   . Hyperlipidemia     Past Surgical History:  Procedure Laterality Date  . APPENDECTOMY  1972  .  DILATION AND CURETTAGE OF UTERUS    . HIATAL HERNIA REPAIR  2016  . INSERTION OF MESH N/A 01/09/2016   Procedure: INSERTION OF MESH;  Surgeon: Axel Filler, MD;  Location: [facility];  Service: General;  Laterality: N/A;  . KNEE SURGERY Bilateral    hx of torn meniscus (arthroscopic surgery)  . LEG SURGERY Left 2001   Had veins stripped   . SHOULDER SURGERY Right    reports hx arthroscopic surgery    Family History  Problem Relation Age of Onset  . Diabetes Mother   . Hypothyroidism Mother   . Seizures Mother     Social History   Tobacco Use  . Smoking status: Never Smoker  . Smokeless tobacco: Never Used  Substance Use Topics  . Alcohol use: Yes    Alcohol/week: 4.2 oz    Types: 7 Standard drinks or equivalent per week    Comment: pt  drinks one glass of hard cidar at dinner each day  . Drug use: No    Current Outpatient Medications  Medication Sig Dispense Refill  . aspirin EC 81 MG tablet Take 1 tablet (81 mg total) by mouth daily. (Patient taking differently: Take 162 mg by mouth daily. Take 2 tablets by mouth daily.)    . buPROPion (WELLBUTRIN XL) 300 MG 24 hr tablet Take 1 tablet (300 mg total) by mouth every morning. 90 tablet 1  . citalopram (CELEXA) 40 MG tablet Take 1 tablet (40 mg total) by mouth daily. 90 tablet 1  . pantoprazole (PROTONIX) 40 MG tablet Take 1 tab  by mouth twice daily 60 tablet 5  . zolpidem (AMBIEN) 10 MG tablet TAKE 1/2 TABLET BY MOUTH AT BEDTIME AS NEEDED FOR SLEEP 30 tablet 0   No current facility-administered medications for this visit.     No Known Allergies  Review of Systems:  Constitutional: Denies fever, chills, diaphoresis, appetite change and fatigue.  HEENT: Denies photophobia, eye pain, redness, ear pain, congestion, sore throat, rhinorrhea, sneezing, mouth sores, neck pain, neck stiffness and tinnitus.  Has hearing problems. Respiratory: Denies SOB, DOE, chest tightness,  and wheezing. Has  Cough. Cardiovascular: Denies chest  pain, palpitations and leg swelling.  Genitourinary: Denies dysuria, urgency, frequency, hematuria, flank pain and difficulty urinating.  Musculoskeletal: Denies myalgias,has back pain. Neurological: Denies dizziness, seizures, syncope, weakness, light-headedness, numbness and headaches.  Hematological: Denies adenopathy. Easy bruising, personal or family bleeding history  Psychiatric/Behavioral: Has anxiety or depression.  Has sleeping problems.     Physical Exam:    BP 128/78   Pulse 78   Ht 5\' 3"  (1.6 m)   Wt 194 lb 2 oz (88.1 kg)   BMI 34.39 kg/m  Filed Weights   10/11/17 1514  Weight: 194 lb 2 oz (88.1 kg)   Constitutional:  Well-developed, in no acute distress. Psychiatric: Normal mood and affect. Behavior is normal. HEENT: Pupils normal.  Conjunctivae are normal. No scleral icterus. Neck supple.  Cardiovascular: Normal rate, regular rhythm. No edema Pulmonary/chest: Effort normal and breath sounds normal. No wheezing, rales or rhonchi. Abdominal: Soft, nondistended. Nontender. Bowel sounds active throughout. There are no masses palpable. No hepatomegaly. Rectal:  defered Neurological: Alert and oriented to person place and time. Skin: Skin is warm and dry. No rashes noted.  Data Reviewed: I have personally reviewed following labs and imaging studies  CBC: CBC Latest Ref Rng & Units 12/13/2016 01/14/2016 01/13/2016  WBC 4.0 - 10.5 K/uL 4.7 5.0 5.4  Hemoglobin 12.0 - 15.0 g/dL 14.712.2 10.4(L) 10.4(L)  Hematocrit 36.0 - 46.0 % 38.3 33.1(L) 33.1(L)  Platelets 150 - 400 K/uL 228 248 230    CMP: CMP Latest Ref Rng & Units 12/13/2016 01/14/2016 01/13/2016  Glucose 65 - 99 mg/dL 829(F108(H) 621(H111(H) 086(V118(H)  BUN 6 - 20 mg/dL 17 78(I26(H) 69(G23(H)  Creatinine 0.44 - 1.00 mg/dL 2.950.74 2.840.72 1.320.72  Sodium 135 - 145 mmol/L 139 141 141  Potassium 3.5 - 5.1 mmol/L 4.0 3.8 3.7  Chloride 101 - 111 mmol/L 104 104 105  CO2 22 - 32 mmol/L 26 29 27   Calcium 8.9 - 10.3 mg/dL 9.0 4.4(W8.7(L) 8.9  Total Protein  6.5 - 8.1 g/dL 7.5 - -  Total Bilirubin 0.3 - 1.2 mg/dL 0.6 - -  Alkaline Phos 38 - 126 U/L 58 - -  AST 15 - 41 U/L 20 - -  ALT 14 - 54 U/L 13(L) - -   Seen in presence of Leitha SchullerAshley    Raj Mikhayla Phillis, MD 10/11/2017, 3:22 PM  Cc: Sandford Craze'Sullivan, Melissa, NP

## 2017-10-12 ENCOUNTER — Ambulatory Visit (HOSPITAL_BASED_OUTPATIENT_CLINIC_OR_DEPARTMENT_OTHER)
Admission: RE | Admit: 2017-10-12 | Discharge: 2017-10-12 | Disposition: A | Discharge status: Home / Self Care | Payer: Medicare Other | Source: Ambulatory Visit | Attending: Family | Admitting: Family

## 2017-10-12 DIAGNOSIS — M85852 Other specified disorders of bone density and structure, left thigh: Secondary | ICD-10-CM | POA: Diagnosis not present

## 2017-10-12 DIAGNOSIS — Z780 Asymptomatic menopausal state: Secondary | ICD-10-CM | POA: Insufficient documentation

## 2017-10-12 DIAGNOSIS — M85832 Other specified disorders of bone density and structure, left forearm: Secondary | ICD-10-CM | POA: Diagnosis not present

## 2017-10-13 ENCOUNTER — Other Ambulatory Visit: Payer: Self-pay | Admitting: Family

## 2017-10-13 ENCOUNTER — Encounter: Payer: Self-pay | Admitting: Family

## 2017-10-13 DIAGNOSIS — M8580 Other specified disorders of bone density and structure, unspecified site: Secondary | ICD-10-CM | POA: Insufficient documentation

## 2017-10-13 MED ORDER — CALCIUM CARBONATE-VITAMIN D 600-400 MG-UNIT PO TABS: 1.0000 | ORAL_TABLET | Freq: Two times a day (BID) | ORAL | Status: DC

## 2017-10-14 ENCOUNTER — Telehealth: Payer: Self-pay | Admitting: *Deleted

## 2017-10-14 NOTE — Telephone Encounter (Signed)
Pt inquiring about mammogram result from 10/06/17. Report no final in Epic. Sent message to radiology to check on status of result. Advised pt we will call her with the outcome.

## 2017-10-18 ENCOUNTER — Ambulatory Visit (HOSPITAL_COMMUNITY)
Admission: RE | Admit: 2017-10-18 | Discharge: 2017-10-18 | Disposition: A | Discharge status: Home / Self Care | Payer: Medicare Other | Source: Ambulatory Visit | Attending: Gastroenterology | Admitting: Gastroenterology

## 2017-10-18 DIAGNOSIS — K228 Other specified diseases of esophagus: Secondary | ICD-10-CM | POA: Insufficient documentation

## 2017-10-18 DIAGNOSIS — K219 Gastro-esophageal reflux disease without esophagitis: Secondary | ICD-10-CM | POA: Diagnosis not present

## 2017-10-18 DIAGNOSIS — R1310 Dysphagia, unspecified: Secondary | ICD-10-CM | POA: Diagnosis not present

## 2017-10-18 DIAGNOSIS — K449 Diaphragmatic hernia without obstruction or gangrene: Secondary | ICD-10-CM | POA: Insufficient documentation

## 2017-10-19 NOTE — Telephone Encounter (Signed)
[facility] with [facility]. They state they have requested previous mammogram film and report from [facility] and will not be able to read the current mammogram without that information. Left detailed message on pt's voicemail regarding status and to see if she can help facilitate this and to call if any questions.

## 2017-10-25 MED FILL — buPROPion HCL ER (XL) 300 M: 300 | 90 days supply | Qty: 90 | Fill #0

## 2017-10-27 ENCOUNTER — Encounter: Payer: Medicare Other | Admitting: Gastroenterology

## 2017-11-09 ENCOUNTER — Encounter: Payer: Medicare Other | Admitting: Gastroenterology

## 2017-11-17 ENCOUNTER — Ambulatory Visit (AMBULATORY_SURGERY_CENTER): Payer: Medicare Other | Admitting: Gastroenterology

## 2017-11-17 ENCOUNTER — Encounter: Payer: Self-pay | Admitting: Gastroenterology

## 2017-11-17 VITALS — BP 145/79 | HR 58 | Temp 98.9°F | Resp 12 | Ht 63.0 in | Wt 194.0 lb

## 2017-11-17 DIAGNOSIS — K2970 Gastritis, unspecified, without bleeding: Secondary | ICD-10-CM

## 2017-11-17 DIAGNOSIS — K222 Esophageal obstruction: Secondary | ICD-10-CM | POA: Diagnosis not present

## 2017-11-17 DIAGNOSIS — F329 Major depressive disorder, single episode, unspecified: Secondary | ICD-10-CM | POA: Diagnosis not present

## 2017-11-17 DIAGNOSIS — K219 Gastro-esophageal reflux disease without esophagitis: Secondary | ICD-10-CM | POA: Diagnosis not present

## 2017-11-17 DIAGNOSIS — R011 Cardiac murmur, unspecified: Secondary | ICD-10-CM | POA: Diagnosis not present

## 2017-11-17 DIAGNOSIS — K3189 Other diseases of stomach and duodenum: Secondary | ICD-10-CM | POA: Diagnosis not present

## 2017-11-17 MED ORDER — SODIUM CHLORIDE 0.9 % IV SOLN: 500.0000 mL | Freq: Once | INTRAVENOUS | Status: DC

## 2017-11-17 NOTE — Patient Instructions (Signed)
Impression/Recommendations:  Gastritis handout given to patient. Post-dilation handout given to patient.  Continue present medications.  Return to GI clinic in 8 weeks.  Pt. To chew food well, and take all medications in sitting position.  YOU HAD AN ENDOSCOPIC PROCEDURE TODAY AT THE [facility]:   Refer to the procedure report that was given to you for any specific questions about what was found during the examination.  If the procedure report does not answer your questions, please call your gastroenterologist to clarify.  If you requested that your care partner not be given the details of your procedure findings, then the procedure report has been included in a sealed envelope for you to review at your convenience later.  YOU SHOULD EXPECT: Some feelings of bloating in the abdomen. Passage of more gas than usual.  Walking can help get rid of the air that was put into your GI tract during the procedure and reduce the bloating. If you had a lower endoscopy (such as a colonoscopy or flexible sigmoidoscopy) you may notice spotting of blood in your stool or on the toilet paper. If you underwent a bowel prep for your procedure, you may not have a normal bowel movement for a few days.  Please Note:  You might notice some irritation and congestion in your nose or some drainage.  This is from the oxygen used during your procedure.  There is no need for concern and it should clear up in a day or so.  SYMPTOMS TO REPORT IMMEDIATELY:   Following upper endoscopy (EGD)  Vomiting of blood or coffee ground material  New chest pain or pain under the shoulder blades  Painful or persistently difficult swallowing  New shortness of breath  Fever of 100F or higher  Black, tarry-looking stools  For urgent or emergent issues, a gastroenterologist can be reached at any hour by calling [phone].   DIET:  We do recommend a small meal at first, but then you may proceed to your regular diet.   Drink plenty of fluids but you should avoid alcoholic beverages for 24 hours.  ACTIVITY:  You should plan to take it easy for the rest of today and you should NOT DRIVE or use heavy machinery until tomorrow (because of the sedation medicines used during the test).    FOLLOW UP: Our staff will call the number listed on your records the next business day following your procedure to check on you and address any questions or concerns that you may have regarding the information given to you following your procedure. If we do not reach you, we will leave a message.  However, if you are feeling well and you are not experiencing any problems, there is no need to return our call.  We will assume that you have returned to your regular daily activities without incident.  If any biopsies were taken you will be contacted by phone or by letter within the next 1-3 weeks.  Please call us at [phone] if you have not heard about the biopsies in 3 weeks.    SIGNATURES/CONFIDENTIALITY: You and/or your care partner have signed paperwork which will be entered into your electronic medical record.  These signatures attest to the fact that that the information above on your After Visit Summary has been reviewed and is understood.  Full responsibility of the confidentiality of this discharge information lies with you and/or your care-partner.

## 2017-11-17 NOTE — Op Note (Signed)
[facility] Patient Name: Judy Weiss Procedure Date: 11/17/2017 9:48 AM MRN: [id number] Endoscopist: Lynann Bologna , MD Age: 72 Referring MD:  Date of Birth: [birth year] Gender: Female Account #: [id number] Procedure:                Upper GI endoscopy Indications:              Dysphagia, with barium swallow showing distal                            esophageal stricture, tortuous esophagus Medicines:                Monitored Anesthesia Care Procedure:                Pre-Anesthesia Assessment:                           - Prior to the procedure, a History and Physical                            was performed, and patient medications and                            allergies were reviewed. The patient's tolerance of                            previous anesthesia was also reviewed. The risks                            and benefits of the procedure and the sedation                            options and risks were discussed with the patient.                            All questions were answered, and informed consent                            was obtained. Prior Anticoagulants: The patient has                            taken no previous anticoagulant or antiplatelet                            agents. ASA Grade Assessment: II - A patient with                            mild systemic disease. After reviewing the risks                            and benefits, the patient was deemed in                            satisfactory condition to undergo the procedure.  After obtaining informed consent, the endoscope was                            passed under direct vision. Throughout the                            procedure, the patient's blood pressure, pulse, and                            oxygen saturations were monitored continuously. The                            Endoscope was introduced through the mouth, and                            advanced to the second  part of duodenum. The upper                            GI endoscopy was accomplished without difficulty.                            The patient tolerated the procedure well. Scope In: Scope Out: Findings:                 The examined esophagus was moderately tortuous and                            dilated.                           One benign-appearing, intrinsic moderate stenosis                            was found. This stenosis measured 1.2 cm (inner                            diameter). The stenosis was traversed. A TTS                            dilator was passed through the scope. Dilation with                            a 13.5-14.5-15.5 mm balloon dilator was performed                            to 15.5 mm. The dilation site was examined and                            showed complete resolution of luminal narrowing.                            Estimated blood loss: none.                           minimal hiatal hernia was noted.  Significant retained food in the stomach limiting                            the examination. photo documentation was obtained.                            Mild inflammation characterized by erythema was                            found in the gastric antrum. Biopsies were taken                            with a cold forceps for histology. Estimated blood                            loss was minimal. No gastric outlet obstruction.                           Evidence of previous Nissen's fundoplication.                            Examination of the fundus was limited due to                            retained food.                           The examined duodenum was normal. Complications:            No immediate complications. Estimated Blood Loss:     Estimated blood loss was minimal. Impression:               - Tortuous esophagus.                           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 Dilated.                           - Gastritis. Biopsied.                           - Limited examination due to retained food in the                            stomach. No gastric outlet obstruction. Recommendation:           - Patient has a contact number available for                            emergencies. The signs and symptoms of potential                            delayed complications were discussed with the  patient. Return to normal activities tomorrow.                            Written discharge instructions were provided to the                            patient.                           - post-dilation diet.                           - Continue present medications.                           - Await pathology results.                           - Assess response to above dilation.                           - Return to GI clinic in 8 weeks. If still with                            problems, and perform further workup by means of a                            solid-phase gastric emptying scan.                           - patient is instructed to chew food well, take all                            medications in sitting position Lynann Bolognaajesh Anjeli Casad, MD 11/17/2017 10:18:20 AM This report has been signed electronically.

## 2017-11-17 NOTE — Progress Notes (Signed)
Called to room to assist during endoscopic procedure.  Patient ID and intended procedure confirmed with present staff. Received instructions for my participation in the procedure from the performing physician.  

## 2017-11-18 ENCOUNTER — Telehealth: Payer: Self-pay

## 2017-11-18 ENCOUNTER — Telehealth: Payer: Self-pay | Admitting: *Deleted

## 2017-11-18 NOTE — Telephone Encounter (Signed)
  Follow up Call-  Call back number 11/17/2017  Post procedure Call Back phone  # [phone]  Permission to leave phone message Yes  Some recent data might be hidden     Patient questions:  Do you have a fever, pain , or abdominal swelling? No. Pain Score  0 *  Have you tolerated food without any problems? Yes.    Have you been able to return to your normal activities? Yes.    Do you have any questions about your discharge instructions: Diet   No. Medications  No. Follow up visit  No.  Do you have questions or concerns about your Care? No.  Actions: * If pain score is 4 or above: No action needed, pain <4.

## 2017-11-18 NOTE — Telephone Encounter (Signed)
First follow up call attempt.  Voicemail with name identifier.  Message left to call if any questions or concerns. 

## 2017-11-22 MED FILL — PANTOPRAZOLE SOD DR 40 MG T: 40 | 30 days supply | Qty: 60 | Fill #1

## 2017-11-30 ENCOUNTER — Encounter: Payer: Self-pay | Admitting: Gastroenterology

## 2017-12-17 LAB — FECAL OCCULT BLOOD, IMMUNOCHEMICAL: IFOBT: NEGATIVE

## 2017-12-21 DIAGNOSIS — Z0000 Encounter for general adult medical examination without abnormal findings: Secondary | ICD-10-CM | POA: Diagnosis not present

## 2017-12-21 MED FILL — CITALOPRAM HBR 40 MG TABLET: 40 | 90 days supply | Qty: 90 | Fill #1

## 2017-12-23 LAB — COLOGUARD: COLOGUARD: NEGATIVE

## 2018-01-02 MED FILL — PANTOPRAZOLE SOD DR 40 MG T: 40 | 30 days supply | Qty: 60 | Fill #2

## 2018-01-03 ENCOUNTER — Telehealth: Payer: Self-pay | Admitting: *Deleted

## 2018-01-03 NOTE — Telephone Encounter (Signed)
[facility]; forwarded to provider/SLS 09/17

## 2018-01-04 NOTE — Telephone Encounter (Signed)
Paper lying on desk was single page with no result attached. Called [phone] (# on form) and spoke with Fleet Contrasachel. She states they only manage the claims and she cannot get us the result nor can she get us a # for the lab that performed the test. I have left detailed message for pt asking her to bring the result she received to our office so we can make a copy for our records and to call if any questions. [facility] to discuss with pt if she calls back.

## 2018-01-06 NOTE — Telephone Encounter (Signed)
Received [facility] Fecal Immunochemical Test result of negative, dated 12/17/17. Result abstracted.

## 2018-01-30 ENCOUNTER — Encounter: Payer: Self-pay | Admitting: Psychology

## 2018-01-30 ENCOUNTER — Ambulatory Visit (INDEPENDENT_AMBULATORY_CARE_PROVIDER_SITE_OTHER): Payer: Medicare Other | Admitting: Psychology

## 2018-01-30 ENCOUNTER — Ambulatory Visit: Payer: Medicare Other | Admitting: Psychology

## 2018-01-30 DIAGNOSIS — R413 Other amnesia: Secondary | ICD-10-CM

## 2018-01-30 NOTE — Progress Notes (Signed)
   Neuropsychology Note  Judy Weiss completed 60 minutes of neuropsychological testing with technician, Wallace Keller, BS, under the supervision of Dr. Elvis Coil, Licensed Psychologist. The patient did not appear overtly distressed by the testing session, per behavioral observation or via self-report to the technician. Rest breaks were offered.   Clinical Decision Making: In considering the patient's current level of functioning, level of presumed impairment, nature of symptoms, emotional and behavioral responses during the interview, level of literacy, and observed level of motivation/effort, a battery of tests was selected and communicated to the psychometrician.  Communication between the psychologist and technician was ongoing throughout the testing session and changes were made as deemed necessary based on patient performance on testing, technician observations and additional pertinent factors such as those listed above.  Judy Weiss will return within approximately 2 weeks for an interactive feedback session with Dr. Alinda Dooms at which time her test performances, clinical impressions and treatment recommendations will be reviewed in detail. The patient understands she can contact our office should she require our assistance before this time.  35 minutes spent performing neuropsychological evaluation services/clinical decision making (psychologist). [CPT 96132] 60 minutes spent face-to-face with patient administering standardized tests, 30 minutes spent scoring (technician). [CPT [id number], 96139]  Full report to follow.
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
## 2018-02-23 MED FILL — buPROPion HCL ER (XL) 300 M: 300 | 90 days supply | Qty: 90 | Fill #1

## 2018-02-27 ENCOUNTER — Ambulatory Visit: Payer: Medicare Other | Admitting: Family

## 2018-02-27 NOTE — Progress Notes (Signed)
NEUROPSYCHOLOGICAL EVALUATION   Name:    Judy Weiss  Date of Birth:   28-Jan-1946 Date of Interview:  01/30/2018 Date of Testing:  01/30/2018   Date of Feedback:  02/28/2018       Background Information:  Reason for Referral:  Kollins Fenter is a 72 y.o. female referred by Dr. Patrcia Dolly to assess her current level of cognitive functioning and assist in differential diagnosis. The current evaluation consisted of a review of available medical records, an interview with the patient, and the completion of a neuropsychological testing battery. Informed consent was obtained.  History of Presenting Problem:  Ms. Orrick was initially seen for neurologic consultation by Dr. Karel Jarvis on 03/25/2017 after she was noted to be confused in her PCP office in August 2018. She feltthat she was just slow to respond to questions, but records suggest that both PCP and ER staff noted confusion. Bloodwork was unremarkable. MOCA score in December 2018 was normal, 28/30. She had reported short-term memory issues, worse with anxiety. She followed up with Dr. Karel Jarvis on 08/24/2017. MMSE was normal, 28/30. She reported feeling better since her last visit. Her MRI brain had shown extensive white matter changes, particularly in the pericallosal region, raising concern for demyelinating disease. She denied any symptoms suggestive of MS flares, and her neurological exam was normal. She opted to hold off on lumbar puncture and repeat brain imaging in 6 months. Repeat brain MRI completed on 09/28/2017 was stable and noted moderate for age nonspecific cerebral white matter signal changes without enhancement, and mild for age nonspecific signal heterogeneity in the deep gray matter and pons.  At today's visit, the patient reports that, since the initial episode of confusion in 03/2017, she has had a couple more similar episodes. She was alone during all episodes and did not seek medical evaluation. She reports she has had one  episode in the past few months. She stated when these episodes occur she will break out in a cold sweat, feel very weak and have to sit down. She feels like she is "kind of in a fog", she does not lose consciousness but feels foggy. She states it does not last more than 10 minutes and then she feels completely back to normal. She has not identified any common precipitating factors/situations.   She also endorses some memory decline more generally. She states she "tests" herself daily, making herself recall what she had for dinner the night before. Sometimes it takes her a while to remember.   Upon direct questioning, the patient reported the following with regard to current cognitive functioning:   Forgetting recent conversations/events: No for conversations, occasionally for events (more difficulty recalling - "if I sit and think it will come back eventually") Repeating statements/questions: No Misplacing/losing items: Yes, but not sure if it is more than usual Forgetting appointments or other obligations: "I forgot this one until last night". She has to keep reminding herself, and makes notes to herself. Forgetting to take medications: Occasionally. She frequently questions whether or not she took AM medication. She does not use a daily pill planner.   Difficulty concentrating: No Starting but not finishing tasks: No Processing information more slowly: Yes  Word-finding difficulty: No Comprehension difficulty: She says she has a hearing problem, she owns a hearing aid but does not wear it, "I can't get used to it"  Getting lost when driving: No, uses GPS when necessary Uncertain about directions when driving or passenger: Yes   The patient lives alone.  Her daughter and daughter's family live nearby. The patient manages all instrumental ADLs, including driving, medications, finances (denies forgetting to pay bills or making errors), appointments, and cooking.   The patient denied  family history of dementia. She noted that her her mother had "episodes" all the patient's life, the episodes were called seizures by her physician. She would "go into a state of mind that she didn't know who anybody was". The episodes would last 30 minutes at most, but she would be completely normal afterwards. Her mother did take seizure medication. The patient does not know when the condition started but it was happening at least in middle age.   Physically, the patient reports she has been feeling well overall. She does have scoliosis which causes pain and difficulty at times. She has reduced balance at times. Her legs swell frequently. She has not had any falls recently. She did have a fall about 2 years ago. She turned around too quickly and became dizzy, she fell in the bathroom and hit her head on the bathtub, cutting her scalp. She believes she lost consciousness, unsure for how long. She was kept overnight at the hospital. She denies any other history of head injury.  She reports a long history of insomnia characterized by difficulty falling asleep. She takes Ambien occasionally (once every two weeks on average). She does feel she gets enough sleep. If she doesn't, she takes a nap. She does not have sleep apnea.  She has had some issues with her stomach since having a hernia and hernia surgery approximately 3 years ago. She is being evaluated by GI.  Psychiatric history is reportedly significant for depression. This appears seasonal in nature. She states she can "fall into a black hole" especially in the winter time. Last January she was getting very depressed and knew she needed to take some action to feel better. She got a part time job and it really helped. 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 This is very helpful but she doesn't get a lot of socialization otherwise. The patient also notes that she takes "two antidepressants". She denies suicidal  ideation or intention.  She also reports a history of depression 25-30 years ago related to "domestic problems". She was using Xanax at the time. She had counseling at that time as well. She denies any recurrence of Xanax use/abuse.   The patient denies any current anxiety or nervousness. She denies hallucinations or illusions.   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 Marital history: Divorced after 45 years of marriage (div ~10 years ago) She has 3 children (daughter lives closeby, sons live in [place] area), and 4 grandchildren.  Alcohol: She drinks two 8-oz hard cider beverages every night. She states they help her "get sleepy at night".  Tobacco: Never a tobacco user    Medical History:  Past Medical History:  Diagnosis Date  . Allergy    food allergies and some additives. But she does not know what will cause.  . Depression   . GERD (gastroesophageal reflux disease)    had in past but now controlled. Has hiatal hernia.  Marland Kitchen Heart murmur   . History of blood clots 1996?   left leg  . History of colon polyps   . History of hiatal hernia   . Hyperlipidemia  Current medications:  Outpatient Encounter Medications as of 02/28/2018  Medication Sig  . aspirin EC 81 MG tablet Take 1 tablet (81 mg total) by mouth daily. (Patient taking differently: Take 162 mg by mouth daily. Take 2 tablets by mouth daily.)  . buPROPion (WELLBUTRIN XL) 300 MG 24 hr tablet Take 1 tablet (300 mg total) by mouth every morning.  . Calcium Carbonate-Vitamin D (CALTRATE 600+D) 600-400 MG-UNIT tablet Take 1 tablet by mouth 2 (two) times daily.  . citalopram (CELEXA) 40 MG tablet Take 1 tablet (40 mg total) by mouth daily.  . pantoprazole (PROTONIX) 40 MG tablet Take 1 tablet (40 mg total) by mouth 2 (two) times daily.  . vitamin B-12  (CYANOCOBALAMIN) 500 MCG tablet Take 500 mcg by mouth daily.  Marland Kitchen zolpidem (AMBIEN) 10 MG tablet TAKE 1/2 TABLET BY MOUTH AT BEDTIME AS NEEDED FOR SLEEP   Facility-Administered Encounter Medications as of 02/28/2018  Medication  . 0.9 %  sodium chloride infusion     Current Examination:  Behavioral Observations:  Appearance: Neatly, casually and appropriately dressed and groomed Gait: Ambulated independently, no gross abnormalities observed Speech: Fluent; normal rate, rhythm and volume. Thought process: Linear, goal directed Affect: Mildly blunted, possibly mildly anxious Interpersonal: Pleasant, appropriate Orientation: Oriented to person, place and most aspects of time (4 days off on the current date). Accurately named the current President and his predecessor.    Tests Administered: . Test of Premorbid Functioning (TOPF) . Wechsler Adult Intelligence Scale-Fourth Edition (WAIS-IV): Similarities, Clinical cytogeneticist, Coding and Digit Span subtests . Wechsler Memory Scale-Fourth Edition (WMS-IV) Older Adult Version (ages 53-90): Logical Memory I, II and Recognition subtests  . DIRECTV Verbal Learning Test - 2nd Edition (CVLT-2) Short Form . Repeatable Battery for the Assessment of Neuropsychological Status (RBANS) Form A:  Figure Copy and Recall subtests and Semantic Fluency subtest . Boston Naming Test (BNT) . Boston Diagnostic Aphasia Examination: Complex Ideational Material subtest . Controlled Oral Word Association Test (COWAT) . Trail Making Test A and B . Clock drawing test . Beck Depression Inventory - 2nd Edition (BDI-II) . Generalized Anxiety Disorder - 7 item screener (GAD-7)  Test Results: Note: Standardized scores are presented only for use by appropriately trained professionals and to allow for any future test-retest comparison. These scores should not be interpreted without consideration of all the information that is contained in the rest of the report. The most recent  standardization samples from the test publisher or other sources were used whenever possible to derive standard scores; scores were corrected for age, gender, ethnicity and education when available.   Test Scores:  Test Name Raw Score Standardized Score Descriptor  TOPF 25/70 SS= 85 Low average  WAIS-IV Subtests     Similarities 25/36 ss= 11 Average  Block Design 29/66 ss= 10 Average  Coding 45/135 ss= 9 Average  Digit Span Forward 10/16 ss= 10 Average  Digit Span Backward 7/16 ss= 9 Average  WMS-IV Subtests     LM I 24/53 ss= 7 Low average  LM II 14/39 ss= 9 Average  LM II Recognition 16/23 Cum %: 17-25 Below expectation  RBANS Subtests     Figure Copy 17/20 Z= -0.4 Average  Figure Recall 8/20 Z= -1.1 Low average  Semantic Fluency 18 Z= -0.4 Average  CVLT-II Scores     Trial 1 2/9 Z= -3 Severely impaired  Trial 4 8/9 Z= 0 Average   Trials 1-4 total 20/36 T= 37 Low average  SD Free Recall 8/9 Z= 0.5 Average  LD Free Recall 6/9 Z= 0 Average  LD Cued Recall 7/9 Z= 0 Average  Recognition Discriminability 9/9 hits 1 false positive Z= 0.5 Average  Forced Choice Recognition 9/9  WNL  BNT 52/60 T= 49 Average  BDAE Complex Ideational Material 11/12    COWAT-FAS 29 T= 39 Low average  COWAT-Animals 13 T= 38 Low average  Trail Making Test A  29" 0 errors T= 58 High average  Trail Making Test B  D/C at 126" on H due to maximum errors reached 5 errors   Severely impaired  Clock Drawing   Mildly impaired  BDI-II 17/63  Mild  GAD-7 0/21  WNL      Description of Test Results:  Premorbid verbal intellectual abilities were estimated to have been within the low average range based on a test of word reading. Psychomotor processing speed was average. Auditory attention and working memory were average. Visual-spatial construction was average. Language abilities were within normal limits. Specifically, confrontation naming was average, and semantic verbal fluency was low average to average.  Auditory comprehension of complex ideational material was within normal limits. With regard to verbal memory, encoding and acquisition of non-contextual information (i.e., word list) was low average. After a brief distracter task, free recall was average (8/9 items). After a delay, free recall was average (6/9 items). Cued recall was average (7/9 items). Performance on a yes/no recognition task was average. On another verbal memory test, encoding and acquisition of contextual auditory information (i.e., short stories) was low average. After a delay, free recall was average. Performance on a yes/no recognition task was mildly below expectation. With regard to non-verbal memory, delayed free recall of visual information was low average. Executive functioning was variable. Mental flexibility and set-shifting were impaired on Trails B. Verbal fluency with phonemic search restrictions was low average. Verbal abstract reasoning was average. Performance on a clock drawing task was mildly impaired (good organization/planning and hands are drawn to correct numbers but are of incorrect lengths). On a self-report measure of mood, the patient's responses were indicative of clinically significant depression at the present time. Symptoms endorsed included: anhedonia, feelings of failure, guilty feelings, loss of interest, indecisiveness, worthlessness, loss of energy, reduced sleep, reduced appetite, concentration difficulty, fatigue, and loss of libido. She denied suicidal ideation or intention. On a self-report measure of anxiety, the patient did not endorse any symptoms of generalized anxiety at the present time.    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 The patient's cognitive test results are largely within normal limits. She did demonstrate impairment in some aspects of executive functioning. Test results  and daily functioning are not indicative of dementia. 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 This does NOT appear consistent with Alzheimer's disease. Brain imaging revealed extensive white matter changes, which may be a contributing factor to executive dysfunction/MCI on testing. Additionally, based on history of discrete episodes of confusion and feeling unwell, I wonder if seizures are a possibility.  The patient also presents with mild depression which has been longstanding and with a seasonal pattern. However, I do not believe depression is contributing to her episodic symptoms.     Recommendations/Plan: Based on the findings of the present evaluation, the following recommendations are offered:  1. Could consider EEG to evaluate for seizure (if she has not already had one) given ongoing episodic symptoms.  2. Recommend further evaluation of insomnia. She may benefit from improved sleep hygiene; written information  was provided. She is drinking 2 alcoholic beverages per night to help her get to sleep, and she was advised this can cause problems with sleep maintenance. (Additionally, she is taking Wellbutrin, and alcohol use with this medication can increase risk of seizures.) 3. She is encouraged to continue behavioral activation and antidepressants for depression. She may also benefit from counseling with a psychologist/therapist if she is interested. 4. Neurocognitive re-evaluation in 1-2 years is recommended in order to monitor cognitive status, track any change in symptoms and further assist with treatment planning.   Feedback to Patient: Imogine Carvell returned for a feedback appointment on 02/28/2018 to review the results of her neuropsychological evaluation with this provider. 20 minutes face-to-face time was spent reviewing her test results, my impressions and my recommendations as detailed above.    Total time spent on this patient's case: 70 minutes for neurobehavioral status exam  with psychologist (CPT code 21308); 90 minutes of testing/scoring by psychometrician under psychologist's supervision (CPT codes [phone], [phone] units); 180 minutes for integration of patient data, interpretation of standardized test results and clinical data, clinical decision making, treatment planning and preparation of this report, and interactive feedback with review of results to the patient/family by psychologist (CPT codes [phone], [phone] units).      Thank you for your referral of Shandale Malak. Please feel free to contact me if you have any questions or concerns regarding this report.

## 2018-02-28 ENCOUNTER — Ambulatory Visit (INDEPENDENT_AMBULATORY_CARE_PROVIDER_SITE_OTHER): Payer: Medicare Other | Admitting: Psychology

## 2018-02-28 ENCOUNTER — Encounter: Payer: Self-pay | Admitting: Psychology

## 2018-02-28 DIAGNOSIS — R9089 Other abnormal findings on diagnostic imaging of central nervous system: Secondary | ICD-10-CM

## 2018-02-28 DIAGNOSIS — G3184 Mild cognitive impairment, so stated: Secondary | ICD-10-CM | POA: Diagnosis not present

## 2018-02-28 NOTE — Patient Instructions (Signed)
Cognitive test results are largely within normal limits. There was some impairment in executive functioning (ability to shift attention back and forth between tasks). Test results and daily functioning are NOT indicative of dementia or Alzheimer's disease. Brain imaging revealed extensive white matter changes, which may be a contributing factor to executive dysfunction on testing.  Based on history of discrete episodes of confusion and feeling unwell, I wonder if seizures are a possibility. You can discuss this with Dr. Karel Jarvis; further testing such as EEG may be warranted.       Recommendations/Plan: Based on the findings of the present evaluation, the following recommendations are offered:  --Recommend further evaluation of insomnia. She may benefit from improved sleep hygiene. She is drinking 2 alcoholic beverages per night to help her get to sleep but this can cause problems with sleep maintenance. Additionally, she is taking Wellbutrin and alcohol use with this medication can increase risk of seizures. -- She is encouraged to continue behavioral activation for depression and mental health treatment (antidepressants). She may also benefit from counseling with a psychologist/therapist if she is interested.  -- Neurocognitive re-evaluation in 1-2 years is recommended in order to monitor cognitive status, track any change in symptoms and further assist with treatment planning.

## 2018-03-07 ENCOUNTER — Encounter: Payer: Self-pay | Admitting: Family

## 2018-03-07 ENCOUNTER — Ambulatory Visit: Payer: Medicare Other | Admitting: Family

## 2018-03-07 VITALS — BP 121/58 | HR 75 | Temp 98.0°F | Resp 18 | Ht 63.0 in | Wt 203.6 lb

## 2018-03-07 DIAGNOSIS — F329 Major depressive disorder, single episode, unspecified: Secondary | ICD-10-CM

## 2018-03-07 DIAGNOSIS — R197 Diarrhea, unspecified: Secondary | ICD-10-CM | POA: Diagnosis not present

## 2018-03-07 DIAGNOSIS — Z79899 Other long term (current) drug therapy: Secondary | ICD-10-CM

## 2018-03-07 DIAGNOSIS — F32A Depression, unspecified: Secondary | ICD-10-CM

## 2018-03-07 DIAGNOSIS — K219 Gastro-esophageal reflux disease without esophagitis: Secondary | ICD-10-CM | POA: Diagnosis not present

## 2018-03-07 DIAGNOSIS — H9319 Tinnitus, unspecified ear: Secondary | ICD-10-CM

## 2018-03-07 DIAGNOSIS — G4700 Insomnia, unspecified: Secondary | ICD-10-CM

## 2018-03-07 MED ORDER — BUPROPION HCL ER (XL) 150 MG PO TB24: 150.0000 mg | ORAL_TABLET | Freq: Every day | ORAL | 1 refills | Status: DC

## 2018-03-07 MED ORDER — ZOLPIDEM TARTRATE 10 MG PO TABS: ORAL_TABLET | ORAL | 0 refills | Status: DC

## 2018-03-07 MED ORDER — CITALOPRAM HYDROBROMIDE 40 MG PO TABS: 40.0000 mg | ORAL_TABLET | Freq: Every day | ORAL | 1 refills | Status: DC

## 2018-03-07 MED FILL — CITALOPRAM HBR 40 MG TABLET: 40 | 90 days supply | Qty: 90 | Fill #0

## 2018-03-07 MED FILL — ZOLPIDEM TARTRATE 10 MG TAB: 10 | 60 days supply | Qty: 30 | Fill #0

## 2018-03-07 NOTE — Progress Notes (Signed)
Subjective:    Patient ID: Judy Weiss, female    DOB: September 19, 1945, 72 y.o.   MRN: [id number]  HPI  Patient is a 72 year old female who presents today for follow-up.  GERD-reports that she had esophageal dilation which has helped her symptoms some.  She reports that she stopped protonix about 1 month ago.    Depression- reports that her mood has been ok.  Overall mood is good.  Continues wellbutrin and citalopram.  Feels like she is devoid of feelings. Felt better on the 150 mg dose in terms of her overall ability to experience emotions.  Reports that she wishes to return to the 150 dose of Wellbutrin.  Insomnia- reports trouble falling asleep.  Reports that she only takes ambien 1 x  A week.   Stool incontinence- reports that about 2 months ago she developed diarrhea.  Now bowels are soft.  Reports that she has to wear a pad due to leakage and rectal mucous.  Reports that she has not had any recent antibiotic use.  She sees Dr. Chales Abrahams for GI.    Tinnitus- notes long standing history of tinnitus.  She has a history of hearing loss and has been prescribed hearing aids.  Review of Systems    see HPI  Past Medical History:  Diagnosis Date  . Allergy    food allergies and some additives. But she does not know what will cause.  . Depression   . GERD (gastroesophageal reflux disease)    had in past but now controlled. Has hiatal hernia.  Marland Kitchen Heart murmur   . History of blood clots 1996?   left leg  . History of colon polyps   . History of hiatal hernia   . Hyperlipidemia      Social History   Socioeconomic History  . Marital status: Divorced    Spouse name: Not on file  . Number of children: Not on file  . Years of education: Not on file  . Highest education level: Not on file  Occupational History  . Not on file  Social Needs  . Financial resource strain: Not on file  . Food insecurity:    Worry: Not on file    Inability: Not on file  . Transportation needs:   Medical: Not on file    Non-medical: Not on file  Tobacco Use  . Smoking status: Never Smoker  . Smokeless tobacco: Never Used  Substance and Sexual Activity  . Alcohol use: Yes    Alcohol/week: 7.0 standard drinks    Types: 7 Standard drinks or equivalent per week    Comment: pt  drinks one glass of hard cidar at dinner each day  . Drug use: No  . Sexual activity: Never  Lifestyle  . Physical activity:    Days per week: Not on file    Minutes per session: Not on file  . Stress: Not on file  Relationships  . Social connections:    Talks on phone: Not on file    Gets together: Not on file    Attends religious service: Not on file    Active member of club or organization: Not on file    Attends meetings of clubs or organizations: Not on file    Relationship status: Not on file  . Intimate partner violence:    Fear of current or ex partner: Not on file    Emotionally abused: Not on file    Physically abused: Not on file  Forced sexual activity: Not on file  Other Topics Concern  . Not on file  Social History Narrative   Lives alone- can walk to her daughter's home   Daughter in [place]   Son in [place]   Son [place]   4 grandchildren (twin girls age 19)   Retired- Presenter, broadcasting at a Associate Professor x 25 years.   Divorced   4 cats and a dog   Enjoys antiquing.  Buys and sells.  Enjoys shopping    Past Surgical History:  Procedure Laterality Date  . APPENDECTOMY  1972  . DILATION AND CURETTAGE OF UTERUS    . HIATAL HERNIA REPAIR  2016  . INSERTION OF MESH N/A 01/09/2016   Procedure: INSERTION OF MESH;  Surgeon: Axel Filler, MD;  Location: [facility];  Service: General;  Laterality: N/A;  . KNEE SURGERY Bilateral    hx of torn meniscus (arthroscopic surgery)  . LEG SURGERY Left 2001   Had veins stripped   . SHOULDER SURGERY Right    reports hx arthroscopic surgery    Family History  Problem Relation Age of Onset  . Diabetes Mother   .  Hypothyroidism Mother   . Seizures Mother     No Known Allergies  Current Outpatient Medications on File Prior to Visit  Medication Sig Dispense Refill  . aspirin EC 81 MG tablet Take 1 tablet (81 mg total) by mouth daily. (Patient taking differently: Take 162 mg by mouth daily. Take 2 tablets by mouth daily.)    . buPROPion (WELLBUTRIN XL) 300 MG 24 hr tablet Take 1 tablet (300 mg total) by mouth every morning. 90 tablet 1  . citalopram (CELEXA) 40 MG tablet Take 1 tablet (40 mg total) by mouth daily. 90 tablet 1  . vitamin B-12 (CYANOCOBALAMIN) 500 MCG tablet Take 500 mcg by mouth daily.    Marland Kitchen zolpidem (AMBIEN) 10 MG tablet TAKE 1/2 TABLET BY MOUTH AT BEDTIME AS NEEDED FOR SLEEP 30 tablet 0  . Calcium Carbonate-Vitamin D (CALTRATE 600+D) 600-400 MG-UNIT tablet Take 1 tablet by mouth 2 (two) times daily. (Patient not taking: Reported on 03/07/2018)    . pantoprazole (PROTONIX) 40 MG tablet Take 1 tablet (40 mg total) by mouth 2 (two) times daily. (Patient not taking: Reported on 03/07/2018) 60 tablet 3   Current Facility-Administered Medications on File Prior to Visit  Medication Dose Route Frequency Provider Last Rate Last Dose  . 0.9 %  sodium chloride infusion  500 mL Intravenous Once Lynann Bologna, MD        BP (!) 121/58 (BP Location: Right Arm)   Pulse 75   Temp 98 F (36.7 C) (Oral)   Resp 18   Ht 5\' 3"  (1.6 m)   Wt 203 lb 9.6 oz (92.4 kg)   SpO2 98%   BMI 36.07 kg/m    Objective:   Physical Exam  Constitutional: She is oriented to person, place, and time. She appears well-developed and well-nourished.  HENT:  Head: Normocephalic and atraumatic.  Right Ear: Tympanic membrane and ear canal normal.  Left Ear: Tympanic membrane and ear canal normal.  Mouth/Throat: No oropharyngeal exudate, posterior oropharyngeal edema or posterior oropharyngeal erythema.  Neck: Neck supple.  Cardiovascular: Normal rate, regular rhythm and normal heart sounds.  No murmur  heard. Pulmonary/Chest: Effort normal and breath sounds normal. No respiratory distress. She has no wheezes.  Lymphadenopathy:    She has no cervical adenopathy.  Neurological: She is alert and oriented to  person, place, and time.  Skin: Skin is warm and dry.  Psychiatric: She has a normal mood and affect. Her behavior is normal. Judgment and thought content normal.          Assessment & Plan:  Depression-overall stable.  She wishes to try decreasing Wellbutrin.  Will decrease to 150 with plan to reevaluate in 6 weeks.  She is instructed to contact me or if symptoms worsen or she has increasing depression on this dose.  GERD-overall stable despite stopping her Protonix.  Monitor.  Diarrhea-this is leading to some fecal incontinence.  Will obtain stool studies to rule out infectious etiology and referral back to her gastroenterologist for further evaluation.  Tinnitus- we discussed that this is often associated with hearing loss and that there is not a cure for it.  We discussed possible use of white noise if the tinnitus is bothersome to her.  Insomnia- rarely using Ambien.  She is requesting a follow-up prescription to have some on hand.  She is due for follow-up UDS today which will be collected.  She declines flu shot today.

## 2018-03-07 NOTE — Patient Instructions (Addendum)
Please stop at the lab before you leave today. Return stool samples at your earliest convenience. You will be contacted about your referral back to GI for the diarrhea. Decrease wellbutrin to 150mg  once daily. Call if you have worsening depression with this change.

## 2018-03-08 LAB — COMPREHENSIVE METABOLIC PANEL
ALK PHOS: 66 U/L (ref 39–117)
ALT: 11 U/L (ref 0–35)
AST: 15 U/L (ref 0–37)
Albumin: 4.5 g/dL (ref 3.5–5.2)
BILIRUBIN TOTAL: 0.4 mg/dL (ref 0.2–1.2)
BUN: 20 mg/dL (ref 6–23)
CALCIUM: 9.3 mg/dL (ref 8.4–10.5)
CO2: 28 mEq/L (ref 19–32)
Chloride: 103 mEq/L (ref 96–112)
Creatinine, Ser: 0.84 mg/dL (ref 0.40–1.20)
GFR: 70.28 mL/min (ref >60.00)
Glucose, Bld: 176 mg/dL — ABNORMAL HIGH (ref 70–99)
Potassium: 4.3 mEq/L (ref 3.5–5.1)
Sodium: 139 mEq/L (ref 135–145)
TOTAL PROTEIN: 7 g/dL (ref 6.0–8.3)

## 2018-03-08 LAB — CBC WITH DIFFERENTIAL/PLATELET
Basophils Absolute: 0.1 10*3/uL (ref 0.0–0.1)
Basophils Relative: 1.9 % (ref 0.0–3.0)
Eosinophils Absolute: 0.3 10*3/uL (ref 0.0–0.7)
Eosinophils Relative: 5.2 % — ABNORMAL HIGH (ref 0.0–5.0)
HEMATOCRIT: 41.6 % (ref 36.0–46.0)
Hemoglobin: 13.8 g/dL (ref 12.0–15.0)
LYMPHS PCT: 33.9 % (ref 12.0–46.0)
Lymphs Abs: 1.7 10*3/uL (ref 0.7–4.0)
MCHC: 33.1 g/dL (ref 30.0–36.0)
MCV: 89 fl (ref 78.0–100.0)
MONOS PCT: 3.8 % (ref 3.0–12.0)
Monocytes Absolute: 0.2 10*3/uL (ref 0.1–1.0)
Neutro Abs: 2.7 10*3/uL (ref 1.4–7.7)
Neutrophils Relative %: 55.2 % (ref 43.0–77.0)
Platelets: 242 10*3/uL (ref 150.0–400.0)
RBC: 4.68 Mil/uL (ref 3.87–5.11)
RDW: 13.5 % (ref 11.5–15.5)
WBC: 4.9 10*3/uL (ref 4.0–10.5)

## 2018-03-09 ENCOUNTER — Other Ambulatory Visit (INDEPENDENT_AMBULATORY_CARE_PROVIDER_SITE_OTHER): Payer: Medicare Other

## 2018-03-09 DIAGNOSIS — R739 Hyperglycemia, unspecified: Secondary | ICD-10-CM | POA: Diagnosis not present

## 2018-03-09 LAB — HEMOGLOBIN A1C: Hgb A1c MFr Bld: 5.8 % (ref 4.6–6.5)

## 2018-03-10 LAB — PAIN MGMT, PROFILE 8 W/CONF, U
6 ACETYLMORPHINE: NEGATIVE ng/mL (ref <10)
ALCOHOL METABOLITES: POSITIVE ng/mL — AB (ref <500)
AMPHETAMINES: NEGATIVE ng/mL (ref <500)
BUPRENORPHINE, URINE: NEGATIVE ng/mL (ref <5)
Benzodiazepines: NEGATIVE ng/mL (ref <100)
CREATININE: 133.8 mg/dL
Cocaine Metabolite: NEGATIVE ng/mL (ref <150)
ETHYL SULFATE (ETS): 613 ng/mL — AB (ref <100)
Ethyl Glucuronide (ETG): 2579 ng/mL — ABNORMAL HIGH (ref <500)
MDMA: NEGATIVE ng/mL (ref <500)
Marijuana Metabolite: NEGATIVE ng/mL (ref <20)
NOROXYCODONE: NEGATIVE ng/mL (ref <50)
OPIATES: NEGATIVE ng/mL (ref <100)
OXYCODONE: NEGATIVE ng/mL (ref <50)
Oxidant: NEGATIVE ug/mL (ref <200)
Oxycodone: NEGATIVE ng/mL (ref <100)
Oxymorphone: NEGATIVE ng/mL (ref <50)
PH: 6.7 (ref 4.5–9.0)

## 2018-03-10 LAB — TIQ-AOE

## 2018-03-13 ENCOUNTER — Other Ambulatory Visit: Payer: Medicare Other

## 2018-03-13 DIAGNOSIS — R197 Diarrhea, unspecified: Secondary | ICD-10-CM | POA: Diagnosis not present

## 2018-03-13 NOTE — Progress Notes (Signed)
Moderate risk status recorded on fyi

## 2018-03-20 LAB — STOOL CULTURE
MICRO NUMBER: 91418335
MICRO NUMBER:: 91418332
MICRO NUMBER:: 91418333
SHIGA RESULT:: NOT DETECTED
SPECIMEN QUALITY: ADEQUATE
SPECIMEN QUALITY:: ADEQUATE
SPECIMEN QUALITY:: ADEQUATE

## 2018-03-20 LAB — OVA AND PARASITE EXAMINATION
CONCENTRATE RESULT:: NONE SEEN
SPECIMEN QUALITY:: ADEQUATE
TRICHROME RESULT: NONE SEEN
VKL: 91418334

## 2018-03-22 ENCOUNTER — Encounter: Payer: Self-pay | Admitting: Neurology

## 2018-03-22 ENCOUNTER — Ambulatory Visit: Payer: Medicare Other | Admitting: Neurology

## 2018-03-22 VITALS — BP 110/70 | HR 73 | Ht 63.0 in | Wt 204.5 lb

## 2018-03-22 DIAGNOSIS — R410 Disorientation, unspecified: Secondary | ICD-10-CM | POA: Diagnosis not present

## 2018-03-22 DIAGNOSIS — G3184 Mild cognitive impairment, so stated: Secondary | ICD-10-CM | POA: Diagnosis not present

## 2018-03-22 DIAGNOSIS — R9089 Other abnormal findings on diagnostic imaging of central nervous system: Secondary | ICD-10-CM | POA: Diagnosis not present

## 2018-03-22 DIAGNOSIS — G4700 Insomnia, unspecified: Secondary | ICD-10-CM | POA: Diagnosis not present

## 2018-03-22 NOTE — Progress Notes (Signed)
NEUROLOGY FOLLOW UP OFFICE NOTE  Alverda Nazzaro [id number] 1945-08-31  HISTORY OF PRESENT ILLNESS: I had the pleasure of seeing Judy Weiss in follow-up in the neurology clinic on 03/22/2018.  The patient was last seen 7 months ago for transient confusion. She is alone in the office today. She had an MRI brain without contrast done 03/2017 which did not show any acute changes. There was extensive white matter changes noted in the pericallosal region, raising concern for demyelinating disease. Findings were discussed with the patient, no clear episodes concerning for MS, we agreed to repeat imaging. I personally reviewed MRI brain with and without contrast done 09/28/17 which showed no significant changes from prior study, moderate white matter changes without enhancement. She underwent Neuropsychological testing last month which was largely within normal limits. There was impairment in some aspects of executive functioning, but test results and daily functioning were not indicative of dementia. 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 White matter changes may be a contributing factor to executive dysfunction. She reported at least 2 more confusional episodes where she becomes very weak, diaphoretic, "like a brain fog" for 5-10 minutes, and concern for seizure was raised. She also reported insomnia and testing recommended further evaluation of insomnia. She drinks 2 cans of hard cider every night. She used to take Ambien daily but has cut this down to 2-3 times a week. She has difficulty with sleep initiation, she took Ambien at 3AM this morning to sleep, she would usually get 7 hours. She feels sometimes she is not rested enough to go to sleep. She has occasional headaches. She has occasional dizziness when standing or walking, feeling a sudden sensation of movement, no falls. She has occasional sensation of restless legs at night where she has to get around  and move her legs. She had some bowel incontinence but with diet changes this has improved some.   HPI 03/25/2017: This is a pleasant 72 yo RH woman with a history of hyperlipidemia, depression, who presented for evaluation of transient amnesia. She reports that she had a regular PCP appointment with her PCP and was hesitant in answering questions, then she was "considered confused and sent to the ER." On review of PCP notes from 12/13/16, she reported "I just don't feel like myself today." She was noted to be confused, mildly tearful, oriented to year and place, unable to name president. She was clearly not herself. In there ER she reported trouble with short-term memory loss and trouble remembering what she wanted to talk to her PCP about. CBC, CMP, UDS were negative. Her urinalysis showed 0-5 WBC, moderate leukocytes. Head CT no acute changes, there was mild diffuse atrophy and chronic microvascular disease. Nursing staff expressed concern about going home, she did not know her home address. She was adamant to be discharged home. She reports that her daughter has urged her to see a neurologist because she has noticed changes, but the patient cannot elaborate what her daughter's concerns are. She sometimes does feel a little confused, more with short-term memory difficulties. She denies getting lost driving, but sometimes forgets where she parked her car. She denies any missed bills or medications. She feels her mind is a little fuzzy and she is "just slow in answering." She missed her initial appointment in our office because she got so anxious that she could not find our office and got so confused. She is taking Celexa and Bupropion. She continues to buy and sell antiques.  She had a fall in the past year, unsure if she passed out with it. She has occasional dizziness and has noticed a change in her walking, she is not as stable as before. She denies any focal numbness/tingling/weakness, no bowel/bladder  dysfunction. She has low back pain. She denies any headache, diplopia, dysarthria, tremors, or anosmia. She has had some difficulty swallowing and has been diagnosed with an esophageal hernia. She underwent surgery but feels she has had more problems after surgery, she feels food wants to come up 2-3 hours after eating. She denies any staring/unresponsive episodes, gaps in time, denies any olfactory/gustatory hallucinations, deja vu, rising epigastric sensation, myoclonic jerks. She had a normal birth and early development.  There is no history of febrile convulsions, CNS infections such as meningitis/encephalitis, significant traumatic brain injury, neurosurgical procedures, or family history of seizures.  PAST MEDICAL HISTORY: Past Medical History:  Diagnosis Date  . Allergy    food allergies and some additives. But she does not know what will cause.  . Depression   . GERD (gastroesophageal reflux disease)    had in past but now controlled. Has hiatal hernia.  Marland Kitchen Heart murmur   . History of blood clots 1996?   left leg  . History of colon polyps   . History of hiatal hernia   . Hyperlipidemia     MEDICATIONS: Current Outpatient Medications on File Prior to Visit  Medication Sig Dispense Refill  . aspirin EC 81 MG tablet Take 1 tablet (81 mg total) by mouth daily. (Patient taking differently: Take 162 mg by mouth daily. Take 2 tablets by mouth daily.)    . buPROPion (WELLBUTRIN XL) 150 MG 24 hr tablet Take 1 tablet (150 mg total) by mouth daily. 30 tablet 1  . Calcium Carbonate-Vitamin D (CALTRATE 600+D) 600-400 MG-UNIT tablet Take 1 tablet by mouth 2 (two) times daily.    . citalopram (CELEXA) 40 MG tablet Take 1 tablet (40 mg total) by mouth daily. 90 tablet 1  . pantoprazole (PROTONIX) 40 MG tablet Take 1 tablet (40 mg total) by mouth 2 (two) times daily. 60 tablet 3  . vitamin B-12 (CYANOCOBALAMIN) 500 MCG tablet Take 500 mcg by mouth daily.    Marland Kitchen zolpidem (AMBIEN) 10 MG tablet TAKE 1/2  TABLET BY MOUTH AT BEDTIME AS NEEDED FOR SLEEP 30 tablet 0   Current Facility-Administered Medications on File Prior to Visit  Medication Dose Route Frequency Provider Last Rate Last Dose  . 0.9 %  sodium chloride infusion  500 mL Intravenous Once Lynann Bologna, MD        ALLERGIES: No Known Allergies  FAMILY HISTORY: Family History  Problem Relation Age of Onset  . Diabetes Mother   . Hypothyroidism Mother   . Seizures Mother     SOCIAL HISTORY: Social History   Socioeconomic History  . Marital status: Divorced    Spouse name: Not on file  . Number of children: Not on file  . Years of education: Not on file  . Highest education level: Not on file  Occupational History  . Not on file  Social Needs  . Financial resource strain: Not on file  . Food insecurity:    Worry: Not on file    Inability: Not on file  . Transportation needs:    Medical: Not on file    Non-medical: Not on file  Tobacco Use  . Smoking status: Never Smoker  . Smokeless tobacco: Never Used  Substance and Sexual Activity  .  Alcohol use: Yes    Alcohol/week: 7.0 standard drinks    Types: 7 Standard drinks or equivalent per week    Comment: pt  drinks one glass of hard cidar at dinner each day  . Drug use: No  . Sexual activity: Never  Lifestyle  . Physical activity:    Days per week: Not on file    Minutes per session: Not on file  . Stress: Not on file  Relationships  . Social connections:    Talks on phone: Not on file    Gets together: Not on file    Attends religious service: Not on file    Active member of club or organization: Not on file    Attends meetings of clubs or organizations: Not on file    Relationship status: Not on file  . Intimate partner violence:    Fear of current or ex partner: Not on file    Emotionally abused: Not on file    Physically abused: Not on file    Forced sexual activity: Not on file  Other Topics Concern  . Not on file  Social History Narrative    Lives alone- can walk to her daughter's home   Daughter in [place]   Son in [place]   Son [place]   4 grandchildren (twin girls age 52)   Retired- Presenter, broadcasting at a Associate Professor x 25 years.   Divorced   4 cats and a dog   Enjoys antiquing.  Buys and sells.  Enjoys shopping    REVIEW OF SYSTEMS: Constitutional: No fevers, chills, or sweats, no generalized fatigue, change in appetite Eyes: No visual changes, double vision, eye pain Ear, nose and throat: No hearing loss, ear pain, nasal congestion, sore throat Cardiovascular: No chest pain, palpitations Respiratory:  No shortness of breath at rest or with exertion, wheezes GastrointestinaI: No nausea, vomiting, diarrhea, abdominal pain, fecal incontinence Genitourinary:  No dysuria, urinary retention or frequency Musculoskeletal:  No neck pain, back pain Integumentary: No rash, pruritus, skin lesions Neurological: as above Psychiatric: No depression, insomnia, +anxiety Endocrine: No palpitations, fatigue, diaphoresis, mood swings, change in appetite, change in weight, increased thirst Hematologic/Lymphatic:  No anemia, purpura, petechiae. Allergic/Immunologic: no itchy/runny eyes, nasal congestion, recent allergic reactions, rashes  PHYSICAL EXAM: Vitals:   03/22/18 1506  BP: 110/70  Pulse: 73  SpO2: 97%   General: No acute distress Head:  Normocephalic/atraumatic Neck: supple, no paraspinal tenderness, full range of motion Heart:  Regular rate and rhythm Lungs:  Clear to auscultation bilaterally Back: No paraspinal tenderness Skin/Extremities: No rash, no edema Neurological Exam: alert and oriented to person, place, and time. Answers questions appropriately but at times appears hesitant/unsure. No aphasia or dysarthria. Fund of knowledge is appropriate.  Recent and remote memory are intact.  Attention and concentration are normal.    Able to name objects and repeat phrases. Cranial nerves: Pupils equal,  round, reactive to light.  Extraocular movements intact with no nystagmus. Visual fields full. Facial sensation intact. No facial asymmetry. Tongue, uvula, palate midline.  Motor: Bulk and tone normal, muscle strength 5/5 throughout with no pronator drift.  Sensation to light touch intact.  No extinction to double simultaneous stimulation.  Deep tendon reflexes +1 throughout, toes downgoing.  Finger to nose testing intact.  Gait narrow-based and steady, left knee at times seems to turn in, she reports knee problems.   IMPRESSION: This is a pleasant 72 yo RH woman with a history of hyperlipidemia, depression,  anxiety, presenting after she was noted to be confused in her PCP office last August 2018. She felt that she was just slow to respond to questions, but it appears that both PCP and ER staff noted confusion. Bloodwork unremarkable. MRI brain in December 2018 did not show acute changes but there was note of extensive white matter changes, particularly in the pericallosal region. Repeat MRI brain in June 2019 was stable. There have been no clinical symptoms or signs on exam concerning for MS, we had discussed doing a lumbar puncture previously and again agreed to hold off for now unless symptoms change. 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 The question of seizure was raised as she reported 2 more episodes since then, routine EEG will be ordered. Neuropsych testing also recommended further evaluation of insomnia, referral to Sleep Medicine will be done. She will follow-up in 6 months and knows to call for any changes.   Thank you for allowing me to participate in her care.  Please do not hesitate to call for any questions or concerns.  The duration of this appointment visit was 30 minutes of face-to-face time with the patient.  Greater than 50% of this time was spent in counseling, explanation of diagnosis, planning of further management, and  coordination of care.   Patrcia DollyKaren Terryl Niziolek, M.D.   CC: Sandford CrazeMelissa O'Sullivan, NP

## 2018-03-22 NOTE — Patient Instructions (Addendum)
1. Schedule routine EEG 2. Refer to Sleep Specialist for insomnia 3. Follow-up in 6 months, call for any changes   RECOMMENDATIONS FOR ALL PATIENTS WITH MEMORY PROBLEMS: 1. Continue to exercise (Recommend 30 minutes of walking everyday, or 3 hours every week) 2. Increase social interactions - continue going to [place] and enjoy social gatherings with friends and family 3. Eat healthy, avoid fried foods and eat more fruits and vegetables 4. Maintain adequate blood pressure, blood sugar, and blood cholesterol level. Reducing the risk of stroke and cardiovascular disease also helps promoting better memory. 5. Avoid stressful situations. Live a simple life and avoid aggravations. Organize your time and prepare for the next day in anticipation. 6. Sleep well, avoid any interruptions of sleep and avoid any distractions in the bedroom that may interfere with adequate sleep quality 7. Avoid sugar, avoid sweets as there is a strong link between excessive sugar intake, diabetes, and cognitive impairment The Mediterranean diet has been shown to help patients reduce the risk of progressive memory disorders and reduces cardiovascular risk. This includes eating fish, eat fruits and green leafy vegetables, nuts like almonds and hazelnuts, walnuts, and also use olive oil. Avoid fast foods and fried foods as much as possible. Avoid sweets and sugar as sugar use has been linked to worsening of memory function.

## 2018-04-03 ENCOUNTER — Ambulatory Visit (INDEPENDENT_AMBULATORY_CARE_PROVIDER_SITE_OTHER): Payer: Medicare Other | Admitting: Neurology

## 2018-04-03 DIAGNOSIS — R410 Disorientation, unspecified: Secondary | ICD-10-CM | POA: Diagnosis not present

## 2018-04-03 DIAGNOSIS — R9089 Other abnormal findings on diagnostic imaging of central nervous system: Secondary | ICD-10-CM | POA: Diagnosis not present

## 2018-04-10 MED FILL — PANTOPRAZOLE SOD DR 40 MG T: 40 | 30 days supply | Qty: 60 | Fill #3

## 2018-04-14 NOTE — Procedures (Signed)
ELECTROENCEPHALOGRAM REPORT  Date of Study: 04/03/2018  Patient's Name: Judy ConnersShelia Mcelwain MRN: [id number] Date of Birth: 10-28-1945  Referring Provider: Dr. Patrcia DollyKaren Charese Abundis  Clinical History: This is a [age] year old woman with episodes of confusion.  Medications: aspirin EC 81 MG tablet  WELLBUTRIN XL 150 MG 24 hr tablet  CALTRATE 600+D 600-400 MG-UNIT tablet   CELEXA 40 MG tablet  PROTONIX 40 MG tablet  CYANOCOBALAMIN 500 MCG tablet  AMBIEN 10 MG tablet  Technical Summary: A multichannel digital EEG recording measured by the international 10-20 system with electrodes applied with paste and impedances below 5000 ohms performed in our laboratory with EKG monitoring in an awake and asleep patient.  Hyperventilation and photic stimulation were performed.  The digital EEG was referentially recorded, reformatted, and digitally filtered in a variety of bipolar and referential montages for optimal display.    Description: The patient is awake and asleep during the recording.  During maximal wakefulness, there is a symmetric, medium voltage 9-10 Hz posterior dominant rhythm that attenuates with eye opening.  The record is symmetric.  During drowsiness and sleep, there is an increase in theta slowing of the background.  Vertex waves and symmetric sleep spindles were seen.  Hyperventilation and photic stimulation did not elicit any abnormalities.  There were no epileptiform discharges or electrographic seizures seen.    EKG lead showed sinus bradycardia at 60 bpm.  Impression: This awake and asleep EEG is normal.    Clinical Correlation: A normal EEG does not exclude a clinical diagnosis of epilepsy.  If further clinical questions remain, prolonged EEG may be helpful.  Clinical correlation is advised.   Patrcia DollyKaren Sharman Garrott, M.D.

## 2018-05-29 MED FILL — PANTOPRAZOLE SOD DR 40 MG T: 40 | 30 days supply | Qty: 60 | Fill #1

## 2018-06-23 ENCOUNTER — Other Ambulatory Visit: Payer: Self-pay | Admitting: Family

## 2018-06-26 MED FILL — buPROPion HCL ER (XL) 300 M: 300 | 90 days supply | Qty: 90 | Fill #0

## 2018-06-28 ENCOUNTER — Other Ambulatory Visit: Payer: Self-pay

## 2018-06-28 NOTE — Addendum Note (Signed)
Addended by: Sandford Craze on: 06/28/2018 04:09 PM   Modules accepted: Orders

## 2018-06-28 NOTE — Telephone Encounter (Signed)
Yes, I would recommend Prevnar 13 and flu shot. Please schedule nurse visit. I have also pended rx for shingrix that she can obtain from her pharmacist. Please ask her pharmacy of choice for this as [facility] does not give shigrix.

## 2018-06-28 NOTE — Telephone Encounter (Signed)
Pt want to know if she should get flu and pneumonia shot because of her age with everything that's going on. Pt states she has never had either shot wants to get advise from PCP.

## 2018-06-29 NOTE — Telephone Encounter (Signed)
Patient scheduled for both test for Tuesday the 17th.

## 2018-07-04 ENCOUNTER — Ambulatory Visit (INDEPENDENT_AMBULATORY_CARE_PROVIDER_SITE_OTHER): Payer: Medicare Other

## 2018-07-04 ENCOUNTER — Other Ambulatory Visit: Payer: Self-pay

## 2018-07-04 DIAGNOSIS — Z23 Encounter for immunization: Secondary | ICD-10-CM | POA: Diagnosis not present

## 2018-07-04 MED FILL — PANTOPRAZOLE SOD DR 40 MG T: 40 | 30 days supply | Qty: 60 | Fill #2

## 2018-07-04 MED FILL — CITALOPRAM HBR 40 MG TABLET: 40 | 90 days supply | Qty: 90 | Fill #1

## 2018-07-04 NOTE — Progress Notes (Signed)
Pt is here today for flu vaccination and Prevnar 13.   Prevnar injected into L deltoid. Fluzone injected into R deltoid. Pt tolerated injections well.

## 2018-08-28 IMAGING — MR MR HEAD W/O CM
11 series · 45 of 48 positions shown · non-contrast
Comparison: Head CT 12/13/2016

CLINICAL DATA: Dizziness, vision problems and hearing loss. Fall 10
months ago.

EXAM:
MRI HEAD WITHOUT CONTRAST
TECHNIQUE: Multiplanar, multiecho pulse sequences of the brain and surrounding
structures were obtained without intravenous contrast.

[Series 2: T1 · sagittal · 5.0mm · 0.45mm/px · 1 of 21 slices shown]
[im 1/21]
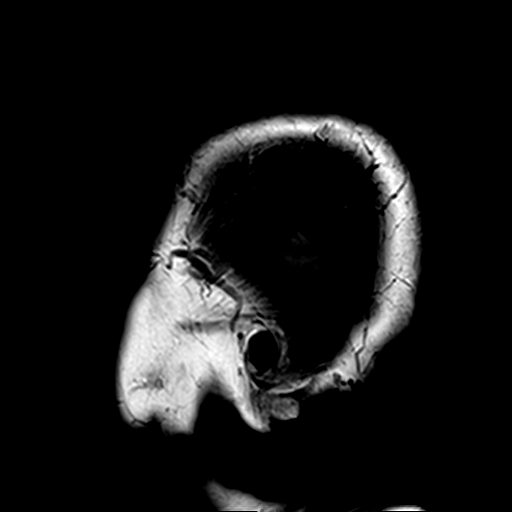

[Series 3: DWI · axial · 3.0mm · 1.88mm/px · z∈[-45,+102]mm · 8 of 100 slices shown (1 of 4)]
[im 1/100]
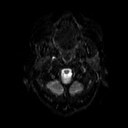
[im 15/100]
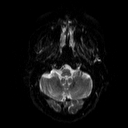
[im 29/100]
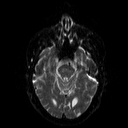
[im 43/100]
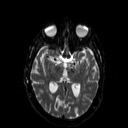
[im 57/100]
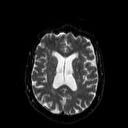
[im 71/100]
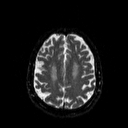
[im 85/100]
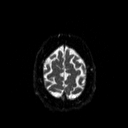
[im 100/100]
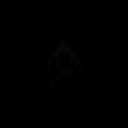

[Series 4: DWI · axial · 3.0mm · 1.88mm/px · z∈[-45,+102]mm · 4 of 50 slices shown (2 of 4)]
[im 1/50]
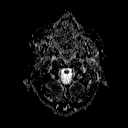
[im 17/50]
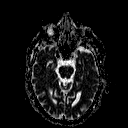
[im 33/50]
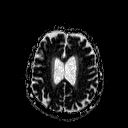
[im 50/50]
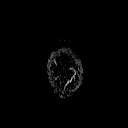

[Series 6: swi_images · axial · 2.0mm · 0.94mm/px · z∈[-50,+108]mm · 6 of 80 slices shown]
[im 1/80]
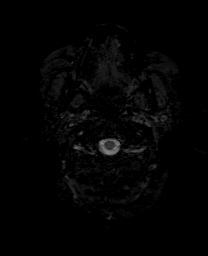
[im 16/80]
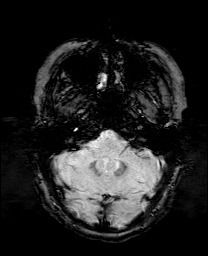
[im 32/80]
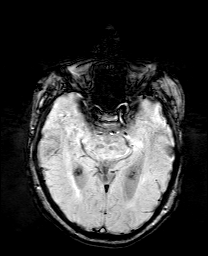
[im 48/80]
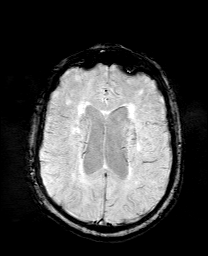
[im 64/80]
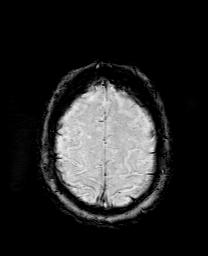
[im 80/80]
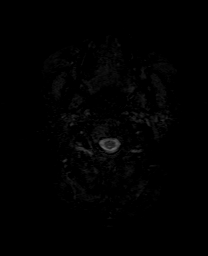

[Series 7: DWI · coronal · 5.0mm · 1.80mm/px · 5 of 67 slices shown (3 of 4)]
[im 1/67]
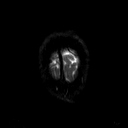
[im 17/67]
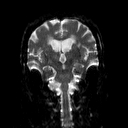
[im 34/67]
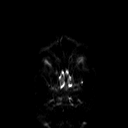
[im 50/67]
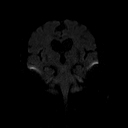
[im 67/67]
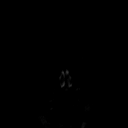

[Series 8: DWI · coronal · 5.0mm · 1.80mm/px · 3 of 34 slices shown (4 of 4)]
[im 1/34]
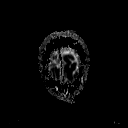
[im 17/34]
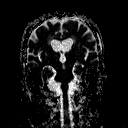
[im 34/34]
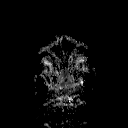

[Series 9: T2 · axial · 5.0mm · 0.54mm/px · z∈[-42,+100]mm · 2 of 22 slices shown (1 of 2)]
[im 1/22]
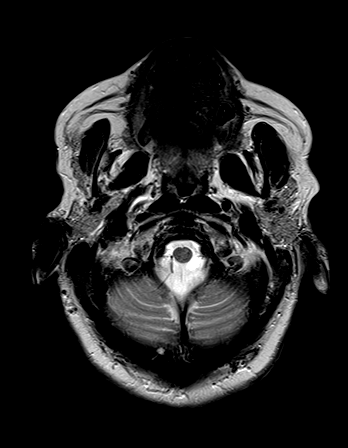
[im 22/22]
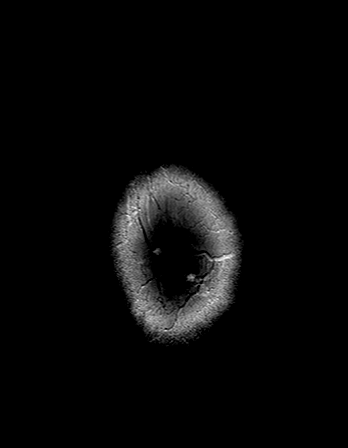

[Series 10: FLAIR · axial · 3.0mm · 0.47mm/px · z∈[-50,+106]mm · 2 of 27 slices shown (1 of 2)]
[im 1/27]
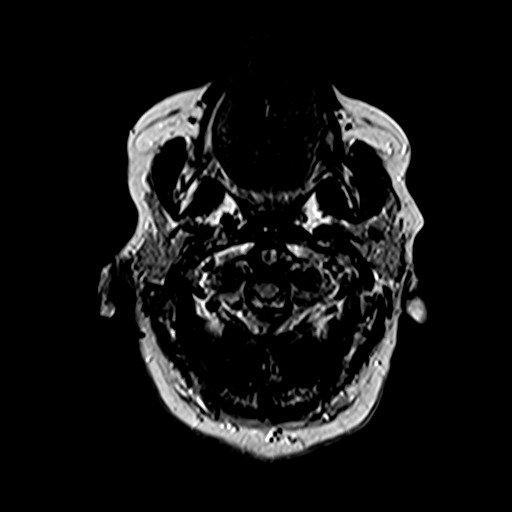
[im 27/27]
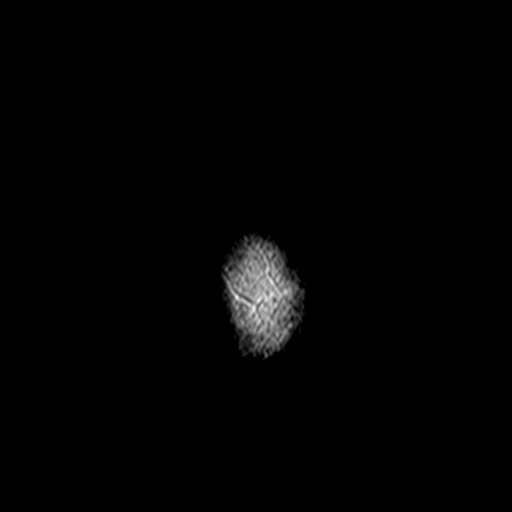

[Series 11: t1_mpr_tra · axial · 1.0mm · 0.47mm/px · z∈[-50,+109]mm · 10 of 160 slices shown]
[im 1/160]
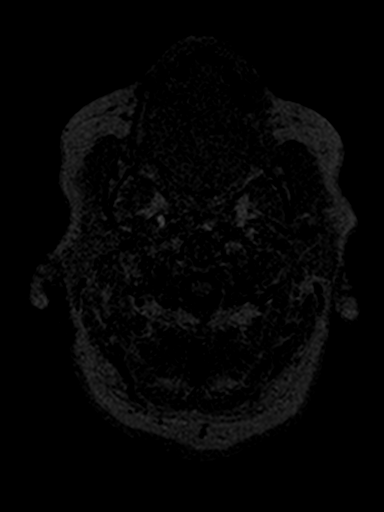
[im 14/160]
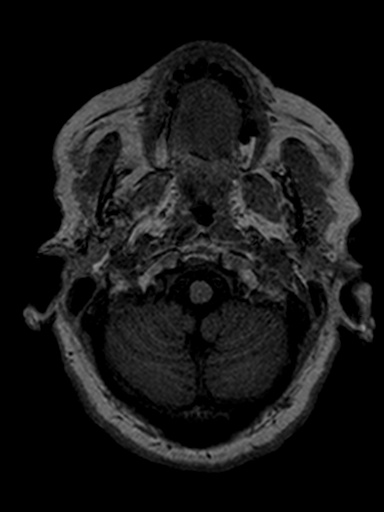
[im 27/160]
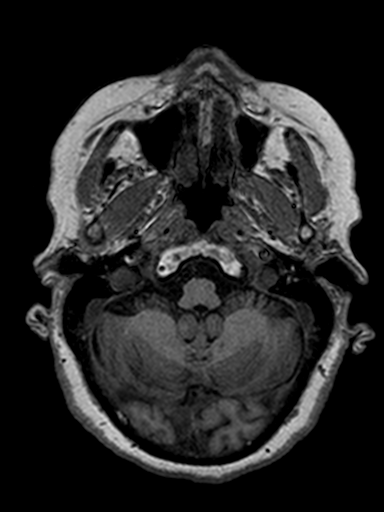
[im 40/160]
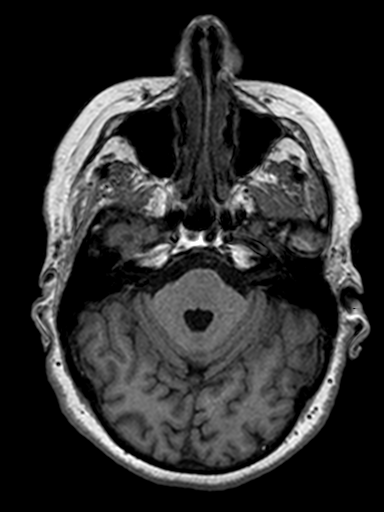
[im 54/160]
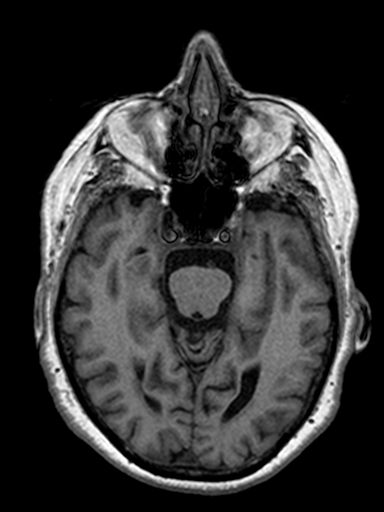
[im 67/160]
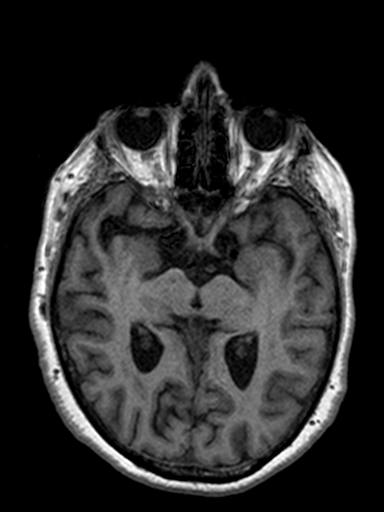
[im 93/160]
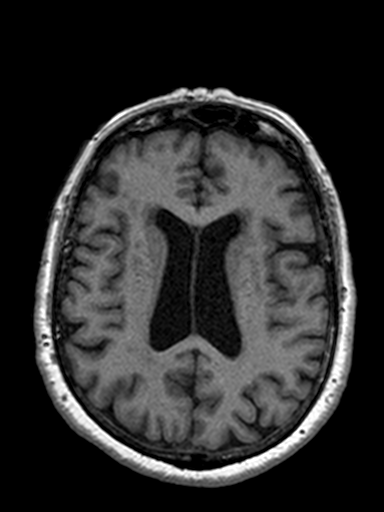
[im 107/160]
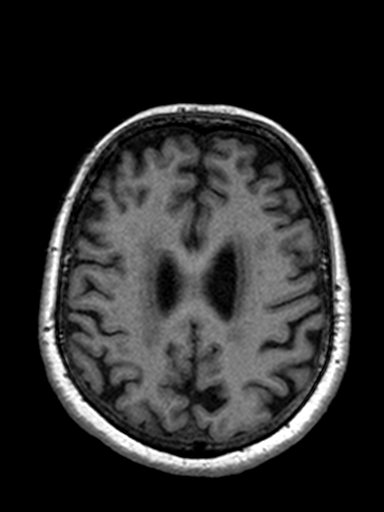
[im 133/160]
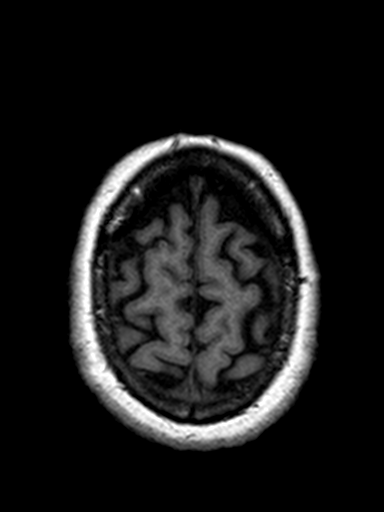
[im 160/160]
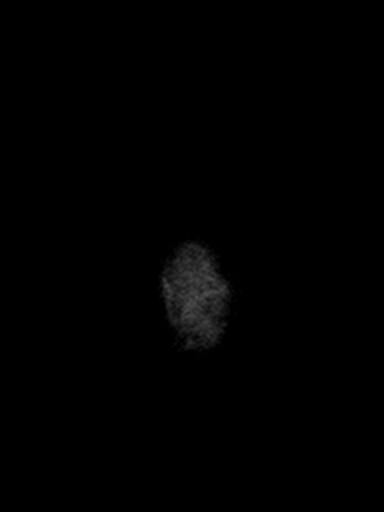

[Series 12: T2 · coronal · 5.0mm · 0.45mm/px · 2 of 26 slices shown (2 of 2)]
[im 1/26]
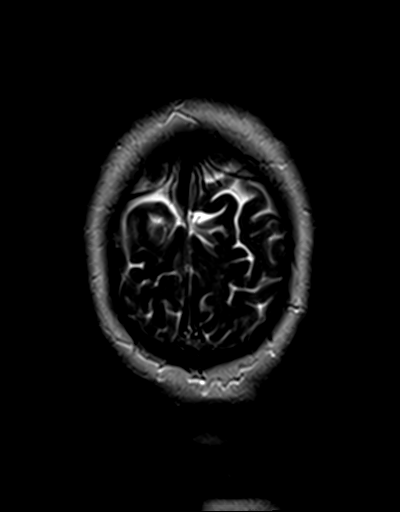
[im 26/26]
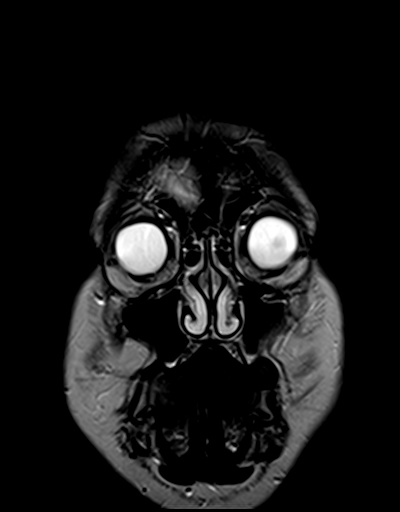

[Series 13: FLAIR · sagittal · 5.0mm · 0.45mm/px · 2 of 25 slices shown (2 of 2)]
[im 1/25]
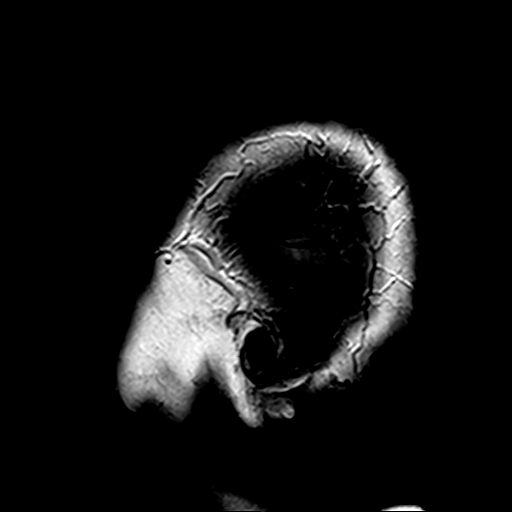
[im 25/25]
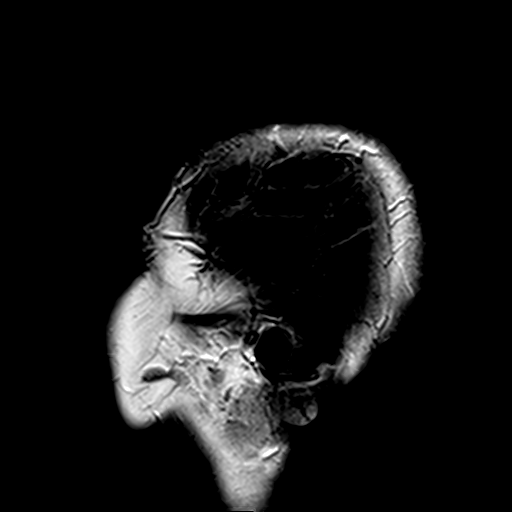

[45 of 48 positions shown; findings below may reference images not displayed]

FINDINGS: Brain: The midline structures are normal. There is no acute infarct
or acute hemorrhage. No mass lesion, hydrocephalus, dural
abnormality or extra-axial collection. There is multifocal
periventricular white matter hyperintensity. There are multiple
lesions that are pericallosal and are oriented perpendicularly to
the long axis of the lateral ventricles. No age-advanced or lobar
predominant atrophy. No chronic microhemorrhage or superficial
siderosis.

Vascular: Major intracranial arterial and venous sinus flow voids
are preserved.

Skull and upper cervical spine: The visualized skull base,
calvarium, upper cervical spine and extracranial soft tissues are
normal.

Sinuses/Orbits: No fluid levels or advanced mucosal thickening. No
mastoid or middle ear effusion. Normal orbits.
IMPRESSION: 1. Extensive pericallosal white matter disease in a pattern that is
compatible with demyelinating disease, such as multiple sclerosis.
CSF sampling would be helpful for confirmation.
2. No acute intracranial abnormality.

## 2018-09-18 ENCOUNTER — Telehealth: Payer: Self-pay | Admitting: Family

## 2018-09-20 MED FILL — PANTOPRAZOLE SOD DR 40 MG T: 40 | 30 days supply | Qty: 60 | Fill #0

## 2018-09-20 NOTE — Telephone Encounter (Signed)
Pt called to find out if Ambien would be refilled.

## 2018-09-21 ENCOUNTER — Other Ambulatory Visit: Payer: Self-pay | Admitting: Family

## 2018-09-25 ENCOUNTER — Other Ambulatory Visit: Payer: Self-pay | Admitting: *Deleted

## 2018-09-25 ENCOUNTER — Telehealth: Payer: Self-pay | Admitting: Family

## 2018-09-25 MED ORDER — ZOLPIDEM TARTRATE 10 MG PO TABS: 5.0000 mg | ORAL_TABLET | Freq: Every evening | ORAL | 0 refills | Status: DC | PRN

## 2018-09-25 NOTE — Telephone Encounter (Signed)
Copied from CRM #[id number]. Topic: Quick Communication - Rx Refill/Question °>> Sep 25, 2018  3:36 PM Cain, Kristen M wrote: °Medication: zolpidem (AMBIEN) 10 MG tablet  ° °Has the patient contacted their pharmacy? No. °(Agent: If no, request that the patient contact the pharmacy for the refill.) The pharmacy stated that they never got the refill request on 09/20/18 even though it says they received it. Please send another request to the pharmacy because the patient needs this medication.  ° ° °Preferred Pharmacy (with phone number or street name): [facility] [place] [facility] - [place], [place] - [address] [phone] (Phone) °[phone] (Fax) ° ° ° °Agent: Please be advised that RX refills may take up to 3 business days. We ask that you follow-up with your pharmacy. °

## 2018-09-25 NOTE — Telephone Encounter (Signed)
Copied from CRM #[id number]. Topic: Quick Communication - Rx Refill/Question °>> Sep 25, 2018  3:36 PM Cain, Kristen M wrote: °Medication: zolpidem (AMBIEN) 10 MG tablet  ° °Has the patient contacted their pharmacy? No. °(Agent: If no, request that the patient contact the pharmacy for the refill.) The pharmacy stated that they never got the refill request on 09/20/18 even though it says they received it. Please send another request to the pharmacy because the patient needs this medication.  ° ° °Preferred Pharmacy (with phone number or street name): [facility] [place] [facility] - [place], [place] - [address] [phone] (Phone) °[phone] (Fax) ° ° ° °Agent: Please be advised that RX refills may take up to 3 business days. We ask that you follow-up with your pharmacy. °

## 2018-09-25 NOTE — Telephone Encounter (Signed)
Copied from [place] [id number]. Topic: Quick Communication - Rx Refill/Question >> Sep 25, 2018  3:36 PM Gustavus Messing wrote: Medication: zolpidem (AMBIEN) 10 MG tablet   Has the patient contacted their pharmacy? No. (Agent: If no, request that the patient contact the pharmacy for the refill.) The pharmacy stated that they never got the refill request on 09/20/18 even though it says they received it. Please send another request to the pharmacy because the patient needs this medication.    Preferred Pharmacy (with phone number or street name): [place], [place] - [place] [phone] (Phone) [phone] (Fax)    Agent: Please be advised that RX refills may take up to 3 business days. We ask that you follow-up with your pharmacy.

## 2018-09-26 MED FILL — ZOLPIDEM TARTRATE 10 MG TAB: 10 | 60 days supply | Qty: 30 | Fill #0

## 2018-10-06 NOTE — Progress Notes (Signed)
Virtual Visit via Video Note  I connected with patient on 10/09/18 at  3:00 PM EDT by audio enabled telemedicine application and verified that I am speaking with the correct person using two identifiers.   THIS ENCOUNTER IS A VIRTUAL VISIT DUE TO COVID-19 - PATIENT WAS NOT SEEN IN THE OFFICE. PATIENT HAS CONSENTED TO VIRTUAL VISIT / TELEMEDICINE VISIT   Location of patient: home  Location of provider: office  I discussed the limitations of evaluation and management by telemedicine and the availability of in person appointments. The patient expressed understanding and agreed to proceed.   Subjective:   Judy Weiss is a 73 y.o. female who presents for Medicare Annual (Subsequent) preventive examination.  Pt recently lost her job due to covid-19.  Review of Systems: No ROS.  Medicare Wellness Virtual Visit.  Visual/audio telehealth visit, UTA vital signs.   See social history for additional risk factors. Cardiac Risk Factors include: advanced age (>755men, 63>65 women);dyslipidemia Sleep patterns: no issues. Home Safety/Smoke Alarms: Feels safe in home. Smoke alarms in place.  Lives alone in 2 story home. Has pets. 1 dog and 4 cats.Daughter lives within walking distance.   Female:       Mammo-  Pt states she will schedule     Dexa scan-  10/13/17      CCS- Cologuard 12/23/17. Negative.    Objective:     Advanced Directives 10/09/2018 10/06/2017 12/13/2016 01/09/2016 01/06/2016  Does Patient Have a Medical Advance Directive? No No No No No  Would patient like information on creating a medical advance directive? No - Patient declined Yes (MAU/Ambulatory/Procedural Areas - Information given) - No - patient declined information No - patient declined information    Tobacco Social History   Tobacco Use  Smoking Status Never Smoker  Smokeless Tobacco Never Used     Counseling given: Not Answered   Clinical Intake: Pain : No/denies pain   Past Medical History:  Diagnosis Date  .  Allergy    food allergies and some additives. But she does not know what will cause.  . Depression   . GERD (gastroesophageal reflux disease)    had in past but now controlled. Has hiatal hernia.  Marland Kitchen. Heart murmur   . History of blood clots 1996?   left leg  . History of colon polyps   . History of hiatal hernia   . Hyperlipidemia    Past Surgical History:  Procedure Laterality Date  . APPENDECTOMY  1972  . DILATION AND CURETTAGE OF UTERUS    . HIATAL HERNIA REPAIR  2016  . INSERTION OF MESH N/A 01/09/2016   Procedure: INSERTION OF MESH;  Surgeon: Axel FillerArmando Ramirez, MD;  Location: [facility];  Service: General;  Laterality: N/A;  . KNEE SURGERY Bilateral    hx of torn meniscus (arthroscopic surgery)  . LEG SURGERY Left 2001   Had veins stripped   . SHOULDER SURGERY Right    reports hx arthroscopic surgery   Family History  Problem Relation Age of Onset  . Diabetes Mother   . Hypothyroidism Mother   . Seizures Mother    Social History   Socioeconomic History  . Marital status: Divorced    Spouse name: Not on file  . Number of children: Not on file  . Years of education: Not on file  . Highest education level: Not on file  Occupational History  . Not on file  Social Needs  . Financial resource strain: Not on file  . Food insecurity  Worry: Not on file    Inability: Not on file  . Transportation needs    Medical: Not on file    Non-medical: Not on file  Tobacco Use  . Smoking status: Never Smoker  . Smokeless tobacco: Never Used  Substance and Sexual Activity  . Alcohol use: Yes    Alcohol/week: 7.0 standard drinks    Types: 7 Standard drinks or equivalent per week    Comment: pt  drinks one glass of hard cidar at dinner each day  . Drug use: No  . Sexual activity: Never  Lifestyle  . Physical activity    Days per week: Not on file    Minutes per session: Not on file  . Stress: Not on file  Relationships  . Social Musicianconnections    Talks on phone: Not on file     Gets together: Not on file    Attends religious service: Not on file    Active member of club or organization: Not on file    Attends meetings of clubs or organizations: Not on file    Relationship status: Not on file  Other Topics Concern  . 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   Divorced   4 cats and a dog   Enjoys antiquing.  Buys and sells.  Enjoys shopping    Outpatient Encounter Medications as of 10/09/2018  Medication Sig  . aspirin EC 81 MG tablet Take 1 tablet (81 mg total) by mouth daily. (Patient taking differently: Take 162 mg by mouth daily. Take 2 tablets by mouth daily.)  . buPROPion (WELLBUTRIN XL) 300 MG 24 hr tablet TAKE 1 TABLET (300 MG TOTAL) BY MOUTH EVERY MORNING.  . Calcium Carbonate-Vitamin D (CALTRATE 600+D) 600-400 MG-UNIT tablet Take 1 tablet by mouth 2 (two) times daily.  . citalopram (CELEXA) 40 MG tablet Take 1 tablet (40 mg total) by mouth daily.  . pantoprazole (PROTONIX) 40 MG tablet TAKE 1 TAB BY MOUTH TWICE DAILY  . vitamin B-12 (CYANOCOBALAMIN) 500 MCG tablet Take 500 mcg by mouth daily.  Marland Kitchen. zolpidem (AMBIEN) 10 MG tablet Take 0.5 tablets (5 mg total) by mouth at bedtime as needed for sleep.  Marland Kitchen. buPROPion (WELLBUTRIN XL) 150 MG 24 hr tablet Take 1 tablet (150 mg total) by mouth daily. (Patient not taking: Reported on 10/09/2018)   Facility-Administered Encounter Medications as of 10/09/2018  Medication  . 0.9 %  sodium chloride infusion    Activities of Daily Living In your present state of health, do you have any difficulty performing the following activities: 10/09/2018  Hearing? N  Vision? N  Difficulty concentrating or making decisions? N  Walking or climbing stairs? N  Dressing or bathing? N  Doing errands, shopping? N   Preparing Food and eating ? N  Using the Toilet? N  In the past six months, have you accidently leaked urine? N  Do you have problems with loss of bowel control? N  Managing your Medications? N  Managing your Finances? N  Housekeeping or managing your Housekeeping? N  Some recent data might be hidden    Patient Care Team: Sandford Craze'Sullivan, Melissa, NP as PCP - General (Internal Medicine)    Assessment:   This is a  routine wellness examination for Anelisse. Physical assessment deferred to PCP.  Exercise Activities and Dietary recommendations Current Exercise Habits: Home exercise routine, Type of exercise: walking, Time (Minutes): 15, Frequency (Times/Week): 7, Weekly Exercise (Minutes/Week): 105, Intensity: Mild, Exercise limited by: None identified Diet (meal preparation, eat out, water intake, caffeinated beverages, dairy products, fruits and vegetables): in general, a "healthy" diet  , well balanced, on average, 3 meals per day    Goals    . DIET - EAT MORE FRUITS AND VEGETABLES    . DIET - INCREASE WATER INTAKE       Fall Risk Fall Risk  10/09/2018 03/22/2018 10/06/2017 08/24/2017 03/25/2017  Falls in the past year? 1 0 No No No  Number falls in past yr: 0 0 - - -  Injury with Fall? 0 0 - - -  Risk for fall due to : - - - - -  Follow up - Falls evaluation completed - - -     Depression Screen PHQ 2/9 Scores 10/09/2018 10/06/2017 12/13/2016 09/09/2015  PHQ - 2 Score 0 1 2 0  PHQ- 9 Score - - 6 -     Cognitive Function Ad8 score reviewed for issues:  Issues making decisions:no  Less interest in hobbies / activities:no  Repeats questions, stories (family complaining): no  Trouble using ordinary gadgets (microwave, computer, phone):no  Forgets the month or year: no  Mismanaging finances: no  Remembering appts:no  Daily problems with thinking and/or memory:no Ad8 score is=0     MMSE - Mini Mental State Exam 10/06/2017 08/24/2017  Orientation to time 5 5  Orientation to  Place 5 5  Registration 3 3  Attention/ Calculation 5 4  Recall 3 2  Language- name 2 objects 2 2  Language- repeat 1 1  Language- follow 3 step command 3 3  Language- read & follow direction 1 1  Write a sentence 1 1  Copy design 1 1  Total score 30 28   Montreal Cognitive Assessment  03/25/2017  Visuospatial/ Executive (0/5) 5  Naming (0/3) 3  Attention: Read list of digits (0/2) 2  Attention: Read list of letters (0/1) 1  Attention: Serial 7 subtraction starting at 100 (0/3) 3  Language: Repeat phrase (0/2) 2  Language : Fluency (0/1) 0  Abstraction (0/2) 2  Delayed Recall (0/5) 4  Orientation (0/6) 6  Total 28      Immunization History  Administered Date(s) Administered  . Influenza, High Dose Seasonal PF 07/04/2018  . Pneumococcal Conjugate-13 07/04/2018   Screening Tests Health Maintenance  Topic Date Due  . Hepatitis C Screening  [birth year]  . TETANUS/TDAP  05/08/1962  . COLONOSCOPY  05/02/2015  . INFLUENZA VACCINE  11/18/2018  . PNA vac Low Risk Adult (2 of 2 - PPSV23) 07/04/2019  . DEXA SCAN  Completed      Plan:   See you next year!  Continue to eat heart healthy diet (full of fruits, vegetables, whole grains, lean protein, water--limit salt, fat, and sugar intake) and increase physical activity as tolerated.  Continue doing brain stimulating activities (puzzles, reading, adult coloring books, staying active) to keep memory sharp.     I have personally reviewed and noted the following in the patient's chart:   . Medical and social history . Use of alcohol, tobacco or illicit drugs  . Current medications and supplements . Functional ability and status . Nutritional status . Physical activity . Advanced directives . List of other physicians . Hospitalizations, surgeries, and  ER visits in previous 12 months . Vitals . Screenings to include cognitive, depression, and falls . Referrals and appointments  In addition, I have reviewed and discussed  with patient certain preventive protocols, quality metrics, and best practice recommendations. A written personalized care plan for preventive services as well as general preventive health recommendations were provided to patient.     Mady HaagensenBritt, Tevis Conger PerryAngel, [place]  10/09/2018

## 2018-10-09 ENCOUNTER — Encounter: Payer: Self-pay | Admitting: *Deleted

## 2018-10-09 ENCOUNTER — Ambulatory Visit (INDEPENDENT_AMBULATORY_CARE_PROVIDER_SITE_OTHER): Payer: Medicare Other | Admitting: *Deleted

## 2018-10-09 ENCOUNTER — Other Ambulatory Visit: Payer: Self-pay

## 2018-10-09 DIAGNOSIS — Z0000 Encounter for general adult medical examination without abnormal findings: Secondary | ICD-10-CM | POA: Diagnosis not present

## 2018-10-09 NOTE — Patient Instructions (Signed)
See you next year!  Continue to eat heart healthy diet (full of fruits, vegetables, whole grains, lean protein, water--limit salt, fat, and sugar intake) and increase physical activity as tolerated.  Continue doing brain stimulating activities (puzzles, reading, adult coloring books, staying active) to keep memory sharp.    Judy Weiss , Thank you for taking time to come for your Medicare Wellness Visit. I appreciate your ongoing commitment to your health goals. Please review the following plan we discussed and let me know if I can assist you in the future.   These are the goals we discussed: Goals    . DIET - EAT MORE FRUITS AND VEGETABLES    . DIET - INCREASE WATER INTAKE       This is a list of the screening recommended for you and due dates:  Health Maintenance  Topic Date Due  .  Hepatitis C: One time screening is recommended by Center for Disease Control  (CDC) for  adults born from [birth year] through 1967.   February 07, 1946  . Tetanus Vaccine  05/08/1962  . Colon Cancer Screening  05/02/2015  . Flu Shot  11/18/2018  . Pneumonia vaccines (2 of 2 - PPSV23) 07/04/2019  . DEXA scan (bone density measurement)  Completed    Health Maintenance After Age 53 After age 40, you are at a higher risk for certain long-term diseases and infections as well as injuries from falls. Falls are a major cause of broken bones and head injuries in people who are older than age 74. Getting regular preventive care can help to keep you healthy and well. Preventive care includes getting regular testing and making lifestyle changes as recommended by your health care provider. Talk with your health care provider about:  Which screenings and tests you should have. A screening is a test that checks for a disease when you have no symptoms.  A diet and exercise plan that is right for you. What should I know about screenings and tests to prevent falls? Screening and testing are the best ways to find a health problem  early. Early diagnosis and treatment give you the best chance of managing medical conditions that are common after age 26. Certain conditions and lifestyle choices may make you more likely to have a fall. Your health care provider may recommend:  Regular vision checks. Poor vision and conditions such as cataracts can make you more likely to have a fall. If you wear glasses, make sure to get your prescription updated if your vision changes.  Medicine review. Work with your health care provider to regularly review all of the medicines you are taking, including over-the-counter medicines. Ask your health care provider about any side effects that may make you more likely to have a fall. Tell your health care provider if any medicines that you take make you feel dizzy or sleepy.  Osteoporosis screening. Osteoporosis is a condition that causes the bones to get weaker. This can make the bones weak and cause them to break more easily.  Blood pressure screening. Blood pressure changes and medicines to control blood pressure can make you feel dizzy.  Strength and balance checks. Your health care provider may recommend certain tests to check your strength and balance while standing, walking, or changing positions.  Foot health exam. Foot pain and numbness, as well as not wearing proper footwear, can make you more likely to have a fall.  Depression screening. You may be more likely to have a fall if you have a  fear of falling, feel emotionally low, or feel unable to do activities that you used to do.  Alcohol use screening. Using too much alcohol can affect your balance and may make you more likely to have a fall. What actions can I take to lower my risk of falls? General instructions  Talk with your health care provider about your risks for falling. Tell your health care provider if: ? You fall. Be sure to tell your health care provider about all falls, even ones that seem minor. ? You feel dizzy, sleepy,  or off-balance.  Take over-the-counter and prescription medicines only as told by your health care provider. These include any supplements.  Eat a healthy diet and maintain a healthy weight. A healthy diet includes low-fat dairy products, low-fat (lean) meats, and fiber from whole grains, beans, and lots of fruits and vegetables. Home safety  Remove any tripping hazards, such as rugs, cords, and clutter.  Install safety equipment such as grab bars in bathrooms and safety rails on stairs.  Keep rooms and walkways well-lit. Activity   Follow a regular exercise program to stay fit. This will help you maintain your balance. Ask your health care provider what types of exercise are appropriate for you.  If you need a cane or walker, use it as recommended by your health care provider.  Wear supportive shoes that have nonskid soles. Lifestyle  Do not drink alcohol if your health care provider tells you not to drink.  If you drink alcohol, limit how much you have: ? 0-1 drink a day for women. ? 0-2 drinks a day for men.  Be aware of how much alcohol is in your drink. In the [place] one drink equals one typical bottle of beer (12 oz), one-half glass of wine (5 oz), or one shot of hard liquor (1 oz).  Do not use any products that contain nicotine or tobacco, such as cigarettes and e-cigarettes. If you need help quitting, ask your health care provider. Summary  Having a healthy lifestyle and getting preventive care can help to protect your health and wellness after age 44.  Screening and testing are the best way to find a health problem early and help you avoid having a fall. Early diagnosis and treatment give you the best chance for managing medical conditions that are more common for people who are older than age 64.  Falls are a major cause of broken bones and head injuries in people who are older than age 33. Take precautions to prevent a fall at home.  Work with your health care  provider to learn what changes you can make to improve your health and wellness and to prevent falls. This information is not intended to replace advice given to you by your health care provider. Make sure you discuss any questions you have with your health care provider. Document Released: 02/16/2017 Document Revised: 02/16/2017 Document Reviewed: 02/16/2017 Elsevier Interactive Patient Education  2019 [employer].

## 2018-10-10 NOTE — Progress Notes (Signed)
RN note reviewed.   Braeleigh Pyper S O'Sullivan NP 

## 2018-10-12 ENCOUNTER — Other Ambulatory Visit: Payer: Self-pay | Admitting: Family

## 2018-10-12 MED FILL — buPROPion HCL ER (XL) 300 M: 300 | 90 days supply | Qty: 90 | Fill #1

## 2018-10-13 MED FILL — PANTOPRAZOLE SOD DR 40 MG T: 40 | 30 days supply | Qty: 60 | Fill #1

## 2018-10-13 MED FILL — CITALOPRAM HBR 40 MG TABLET: 40 | 90 days supply | Qty: 90 | Fill #0

## 2018-10-25 ENCOUNTER — Telehealth: Payer: Self-pay | Admitting: Neurology

## 2018-10-25 NOTE — Telephone Encounter (Signed)
Pt given instructions on how she would be contacted prior to the virtual visit and the day of. She then hung up on me.

## 2018-10-25 NOTE — Telephone Encounter (Signed)
New Message  Patient verbalized she is needing to speak to a nurse or MD concerning the [facility] appointment.  Patient verbalized she is needing to know the number that is going to call her due to her phone is setup if the number is not saved, it will go straight to voicemail.  Please f/u with patient

## 2018-10-31 ENCOUNTER — Other Ambulatory Visit: Payer: Self-pay

## 2018-10-31 ENCOUNTER — Telehealth (INDEPENDENT_AMBULATORY_CARE_PROVIDER_SITE_OTHER): Payer: Medicare Other | Admitting: Neurology

## 2018-10-31 ENCOUNTER — Encounter: Payer: Self-pay | Admitting: Neurology

## 2018-10-31 VITALS — Ht 62.0 in | Wt 190.0 lb

## 2018-10-31 DIAGNOSIS — R410 Disorientation, unspecified: Secondary | ICD-10-CM | POA: Diagnosis not present

## 2018-10-31 NOTE — Progress Notes (Addendum)
Virtual Visit via Telephone Note The purpose of this virtual visit is to provide medical care while limiting exposure to the novel coronavirus.    Consent was obtained for phone visit:  Yes.   Answered questions that patient had about telehealth interaction:  Yes.   I discussed the limitations, risks, security and privacy concerns of performing an evaluation and management service by telephone. I also discussed with the patient that there may be a patient responsible charge related to this service. The patient expressed understanding and agreed to proceed.  Pt location: Home Physician Location: office Name of referring provider:  Debbrah Alar, NP I connected with .Judy Weiss at patients initiation/request on 10/31/2018 at  3:00 PM EDT by telephone and verified that I am speaking with the correct person using two identifiers.  Pt MRN:  [id number] Pt DOB:  01-Nov-1945   History of Present Illness:  The patient had a phone visit on 10/31/2018. She was last seen 7 months ago for transient confusion. MRI brain in 2018 and 2019 no acute changes. Her 1-hour EEG was normal. She thinks she has had 3 more spells since her last visit, where she gets very hot with sweat pouring down her, she gets very weak and feels a little confused for 5 minutes. She usually gets something to drink and sits down. She has checked her BP with them with no changes noted. She denies any loss of awareness or loss of consciousness. She feels her heart beating fast, no chest pain or shortness of breath. The last episode was a couple of months ago. No associated headache, dizziness, vision changes, focal numbness/tingling/weakness. She continues to deal with insomnia and found out that Ambien is causing itching at night. Itching resolved with stopping Ambien. She has difficulty with sleep initiation then can sleep through after.   History on Initial Assessment 03/25/2017: This is a pleasant 73 yo RH woman with a history  of hyperlipidemia, depression, who presented for evaluation of transient amnesia. She reports that she had a regular PCP appointment with her PCP and was hesitant in answering questions, then she was "considered confused and sent to the ER." On review of PCP notes from 12/13/16, she reported "I just don't feel like myself today." She was noted to be confused, mildly tearful, oriented to year and place, unable to name president. She was clearly not herself. In there ER she reported trouble with short-term memory loss and trouble remembering what she wanted to talk to her PCP about. CBC, CMP, UDS were negative. Her urinalysis showed 0-5 WBC, moderate leukocytes. Head CT no acute changes, there was mild diffuse atrophy and chronic microvascular disease. Nursing staff expressed concern about going home, she did not know her home address. She was adamant to be discharged home. She reports that her daughter has urged her to see a neurologist because she has noticed changes, but the patient cannot elaborate what her daughter's concerns are. She sometimes does feel a little confused, more with short-term memory difficulties. She denies getting lost driving, but sometimes forgets where she parked her car. She denies any missed bills or medications. She feels her mind is a little fuzzy and she is "just slow in answering." She missed her initial appointment in our office because she got so anxious that she could not find our office and got so confused. She is taking Celexa and Bupropion. She continues to buy and sell antiques. She had a fall in the past year, unsure if she passed  out with it. She has occasional dizziness and has noticed a change in her walking, she is not as stable as before. She denies any focal numbness/tingling/weakness, no bowel/bladder dysfunction. She has low back pain. She denies any headache, diplopia, dysarthria, tremors, or anosmia. She has had some difficulty swallowing and has been diagnosed with an  esophageal hernia. She underwent surgery but feels she has had more problems after surgery, she feels food wants to come up 2-3 hours after eating. She denies any staring/unresponsive episodes, gaps in time, denies any olfactory/gustatory hallucinations, deja vu, rising epigastric sensation, myoclonic jerks. She had a normal birth and early development.  There is no history of febrile convulsions, CNS infections such as meningitis/encephalitis, significant traumatic brain injury, neurosurgical procedures, or family history of seizures.    Observations/Objective: Limited due to nature of phone visit. Patient is awake, alert, able to answer questions with no dysarthria noted.  Montreal Cognitive Assessment  10/31/2018 03/25/2017  Visuospatial/ Executive (0/5) 5 5  Naming (0/3) 3 3  Attention: Read list of digits (0/2) 2 2  Attention: Read list of letters (0/1) 1 1  Attention: Serial 7 subtraction starting at 100 (0/3) 2 3  Language: Repeat phrase (0/2) 2 2  Language : Fluency (0/1) 0 0  Abstraction (0/2) 2 2  Delayed Recall (0/5) 4 4  Orientation (0/6) 6 6  Total 27 28    Assessment and Plan:   This is a pleasant 73 yo RH woman with a history of hyperlipidemia, depression, anxiety, presenting after she was noted to be confused in her PCP office last August 2018. She felt that she was just slow to respond to questions, but it appears that both PCP and ER staff noted confusion. Bloodwork unremarkable. MRI brain in December 2018 did not show acute changes but there was note of extensive white matter changes, particularly in the pericallosal region. Repeat MRI brain in June 2019 was stable. There have been no clinical symptoms or signs on exam concerning for MS, findings again discussed with patient today. 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 MOCA score today normal 27/30. She continues to report transient episodes where she feels very hot,  diaphoretic, then weak and a little confused. EEG normal. Recommended checking glucose levels during these periods, and if normal, would do a 30-day holter monitor. She will discuss insomnia and other medications with her PCP, consider Sleep Specialist evaluation. She will follow-up in 6 months and knows to call for any changes.    Follow Up Instructions:   -I discussed the assessment and treatment plan with the patient. The patient was provided an opportunity to ask questions and all were answered. The patient agreed with the plan and demonstrated an understanding of the instructions.   The patient was advised to call back or seek an in-person evaluation if the symptoms worsen or if the condition fails to improve as anticipated.    Total Time spent in visit with the patient was:  11 minutes, of which 100% of the time was spent in counseling and/or coordinating care on the above.   Pt understands and agrees with the plan of care outlined.     Van ClinesKaren M , MD

## 2018-11-16 DIAGNOSIS — Z0120 Encounter for dental examination and cleaning without abnormal findings: Secondary | ICD-10-CM | POA: Diagnosis not present

## 2019-01-08 MED FILL — PANTOPRAZOLE SOD DR 40 MG T: 40 | 30 days supply | Qty: 60 | Fill #2

## 2019-01-29 ENCOUNTER — Other Ambulatory Visit: Payer: Self-pay | Admitting: Family

## 2019-01-29 MED FILL — CITALOPRAM HBR 40 MG TABLET: 40 | 90 days supply | Qty: 90 | Fill #1

## 2019-01-31 MED FILL — buPROPion HCL ER (XL) 300 M: 300 | 90 days supply | Qty: 90 | Fill #0

## 2019-01-31 NOTE — Telephone Encounter (Signed)
Bupropion refill sent to pharmacy. Left detailed message on pt's voicemail that office visit is needed prior to additional refills and to call the office to schedule appointment soon.

## 2019-03-06 MED FILL — PANTOPRAZOLE SOD DR 40 MG T: 40 | 30 days supply | Qty: 60 | Fill #3

## 2019-04-27 MED FILL — PANTOPRAZOLE SOD DR 40 MG T: 40 | 30 days supply | Qty: 60 | Fill #4

## 2019-05-29 ENCOUNTER — Telehealth: Payer: Self-pay | Admitting: Family

## 2019-05-29 NOTE — Telephone Encounter (Signed)
Medication: buPROPion (WELLBUTRIN XL) 300 MG 24 hr tablet    citalopram (CELEXA) 40 MG tablet   Has the patient contacted their pharmacy? No.     (If no, request that the patient contact the pharmacy for the refill.) (If yes, when and what did the pharmacy advise?)  Preferred Pharmacy (with phone number or street name):    [facility] - [place], [place] - [address]   Agent: Please be advised that RX refills may take up to 3 business days. We ask that you follow-up with your pharmacy.

## 2019-05-29 NOTE — Telephone Encounter (Signed)
Patient advised she will need to be evaluated prior to refill. Appointment was moved to tomorrow.

## 2019-05-30 ENCOUNTER — Ambulatory Visit (INDEPENDENT_AMBULATORY_CARE_PROVIDER_SITE_OTHER): Payer: Medicare Other | Admitting: Family

## 2019-05-30 ENCOUNTER — Telehealth: Payer: Self-pay

## 2019-05-30 ENCOUNTER — Other Ambulatory Visit: Payer: Self-pay

## 2019-05-30 DIAGNOSIS — F32A Depression, unspecified: Secondary | ICD-10-CM

## 2019-05-30 DIAGNOSIS — G4700 Insomnia, unspecified: Secondary | ICD-10-CM

## 2019-05-30 DIAGNOSIS — F329 Major depressive disorder, single episode, unspecified: Secondary | ICD-10-CM

## 2019-05-30 MED ORDER — ZOLPIDEM TARTRATE 5 MG PO TABS: 5.0000 mg | ORAL_TABLET | Freq: Every evening | ORAL | 0 refills | Status: DC | PRN

## 2019-05-30 MED ORDER — PANTOPRAZOLE SODIUM 40 MG PO TBEC: 40.0000 mg | DELAYED_RELEASE_TABLET | Freq: Every day | ORAL | 1 refills | Status: DC

## 2019-05-30 MED ORDER — CITALOPRAM HYDROBROMIDE 40 MG PO TABS: 40.0000 mg | ORAL_TABLET | Freq: Every day | ORAL | 1 refills | Status: DC

## 2019-05-30 MED ORDER — BUPROPION HCL ER (XL) 300 MG PO TB24: ORAL_TABLET | ORAL | 1 refills | Status: DC

## 2019-05-30 MED FILL — PANTOPRAZOLE SOD DR 40 MG T: 40 | 90 days supply | Qty: 90 | Fill #0

## 2019-05-30 MED FILL — ZOLPIDEM TARTRATE 5 MG TAB: 5 | 30 days supply | Qty: 30 | Fill #0

## 2019-05-30 MED FILL — BUPROPION HCL XL 300 MG TAB: 300 | 90 days supply | Qty: 90 | Fill #0

## 2019-05-30 MED FILL — CITALOPRAM HBR 40 MG TABLET: 40 | 90 days supply | Qty: 90 | Fill #0

## 2019-05-30 NOTE — Progress Notes (Addendum)
Virtual Visit via telephone note  I connected with Judy Weiss on 05/30/19 at 12:20 PM EST by a telephone and verified that I am speaking with the correct person using two identifiers.  Location: Patient: home Provider: work   I discussed the limitations of evaluation and management by telemedicine and the availability of in person appointments. The patient expressed understanding and agreed to proceed.  History of Present Illness:  Pt is a 74 yr old female who presents today for follow up.  Depression- maintained on wellbutrin and citalopram. She reports that she has felt isolated.  Continues citalopram.    Insomnia- she reports that she has not used [place] recently. She is using nyquil prn.   She had the 1st covid vaccine.   Observations/Objective:   Gen: Awake, alert, no acute distress Resp: Breathing is even and non-labored Psych: calm/pleasant demeanor Neuro: Alert and Oriented x 3, + facial symmetry, speech is clear.   Assessment and Plan:  Depression- reports mood is stable on wellbutrin. She feels a bit socially isolated due to covid but feels that this is situational.  Will continue same.    Insomnia- wishes to restart Remus Loffler- she is aware of increased risk of falls at her age on this medication.  Plan to have her return to the lab to update contract and provide UDS.  She will d/c nyquil.   21 minutes spent on today's visit.   Follow Up Instructions:    I discussed the assessment and treatment plan with the patient. The patient was provided an opportunity to ask questions and all were answered. The patient agreed with the plan and demonstrated an understanding of the instructions.   The patient was advised to call back or seek an in-person evaluation if the symptoms worsen or if the condition fails to improve as anticipated.  Lemont Fillers, NP

## 2019-05-30 NOTE — Telephone Encounter (Signed)
Patient called in to tell Dr. Lendell Caprice that she needs a prescription for a glucose meter. Please follow up with the patient at [phone]   Thanks,

## 2019-05-31 ENCOUNTER — Telehealth: Payer: Self-pay | Admitting: Family

## 2019-05-31 ENCOUNTER — Encounter: Payer: Self-pay | Admitting: Family

## 2019-05-31 NOTE — Telephone Encounter (Signed)
Please see my other phone note.

## 2019-05-31 NOTE — Telephone Encounter (Signed)
Please call pt to schedule lab visit and leave ambien controlled substance contract at the lab for her to sign when she provides the UDS. s

## 2019-05-31 NOTE — Telephone Encounter (Signed)
Patient reports she was advised per neurologist to check her glucose levels due to dizziness. She will wait until she sees Melissa again to discuss. "no rx needed at this time"

## 2019-05-31 NOTE — Telephone Encounter (Signed)
noted 

## 2019-05-31 NOTE — Telephone Encounter (Signed)
Patient scheduled for UDS tomorrow, contract left at front for signature.

## 2019-05-31 NOTE — Telephone Encounter (Signed)
Patient inquired about labs Judy Weiss  She stated during her virtual on 02/10 that she had to do labs. However no orders are in the system. Please advise Dthomas

## 2019-06-01 ENCOUNTER — Other Ambulatory Visit: Payer: Medicare Other

## 2019-06-06 ENCOUNTER — Ambulatory Visit: Payer: Medicare Other | Admitting: Family

## 2019-06-18 ENCOUNTER — Other Ambulatory Visit: Payer: Self-pay

## 2019-06-18 ENCOUNTER — Other Ambulatory Visit: Payer: Medicare Other

## 2019-06-18 DIAGNOSIS — G4700 Insomnia, unspecified: Secondary | ICD-10-CM

## 2019-06-19 DIAGNOSIS — Z0120 Encounter for dental examination and cleaning without abnormal findings: Secondary | ICD-10-CM | POA: Diagnosis not present

## 2019-06-20 LAB — PAIN MGMT, PROFILE 8 W/CONF, U
6 Acetylmorphine: NEGATIVE ng/mL
Alcohol Metabolites: POSITIVE ng/mL — AB (ref <500)
Amphetamine: NEGATIVE ng/mL
Amphetamines: NEGATIVE ng/mL
Benzodiazepines: NEGATIVE ng/mL
Buprenorphine, Urine: NEGATIVE ng/mL
Cocaine Metabolite: NEGATIVE ng/mL
Creatinine: 210.2 mg/dL
Ethyl Glucuronide (ETG): 1727 ng/mL
Ethyl Sulfate (ETS): 386 ng/mL
MDMA: NEGATIVE ng/mL
Marijuana Metabolite: NEGATIVE ng/mL
Methamphetamine: NEGATIVE ng/mL
Opiates: NEGATIVE ng/mL
Oxidant: NEGATIVE ug/mL
Oxycodone: NEGATIVE ng/mL
pH: 5.8 (ref 4.5–9.0)

## 2019-07-27 ENCOUNTER — Other Ambulatory Visit: Payer: Self-pay

## 2019-07-27 ENCOUNTER — Telehealth: Payer: Self-pay | Admitting: *Deleted

## 2019-07-27 ENCOUNTER — Emergency Department (HOSPITAL_BASED_OUTPATIENT_CLINIC_OR_DEPARTMENT_OTHER)
Admission: EM | Admit: 2019-07-27 | Discharge: 2019-07-28 | Disposition: A | Discharge status: Home / Self Care | Payer: Medicare Other | Attending: Emergency Medicine | Admitting: Emergency Medicine

## 2019-07-27 ENCOUNTER — Encounter (HOSPITAL_BASED_OUTPATIENT_CLINIC_OR_DEPARTMENT_OTHER): Payer: Self-pay

## 2019-07-27 DIAGNOSIS — Z79899 Other long term (current) drug therapy: Secondary | ICD-10-CM | POA: Insufficient documentation

## 2019-07-27 DIAGNOSIS — R350 Frequency of micturition: Secondary | ICD-10-CM | POA: Diagnosis present

## 2019-07-27 DIAGNOSIS — N3001 Acute cystitis with hematuria: Secondary | ICD-10-CM | POA: Diagnosis not present

## 2019-07-27 DIAGNOSIS — E785 Hyperlipidemia, unspecified: Secondary | ICD-10-CM | POA: Diagnosis not present

## 2019-07-27 LAB — URINALYSIS, MICROSCOPIC (REFLEX): WBC, UA: >50 WBC/hpf (ref 0–5)

## 2019-07-27 LAB — URINALYSIS, ROUTINE W REFLEX MICROSCOPIC
Bilirubin Urine: NEGATIVE
Glucose, UA: NEGATIVE mg/dL
Ketones, ur: 15 mg/dL — AB
Nitrite: NEGATIVE
Protein, ur: 30 mg/dL — AB
Specific Gravity, Urine: >1.03 — ABNORMAL HIGH (ref 1.005–1.030)
pH: 6 (ref 5.0–8.0)

## 2019-07-27 MED ORDER — CEPHALEXIN 250 MG PO CAPS
500.0000 mg | ORAL_CAPSULE | Freq: Once | ORAL | Status: AC
  Administered 2019-07-27: 500 mg via ORAL
  Filled 2019-07-27: qty 2

## 2019-07-27 NOTE — Telephone Encounter (Signed)
Call Type Triage / Clinical Relationship To Patient Self Return Phone Number [phone] (Primary) Chief Complaint Urinary decreased output (> THREE MONTHS and no output in 8 hours) Reason for Call Symptomatic / Request for Health Information Initial Comment Caller states she can't urinate. She will dribble but has a lot of pressure. It's been over 8 hours. Translation No Nurse Assessment Nurse: Ladona Ridgel, RN, Felicia Date/Time (Eastern Time): 07/26/2019 6:34:59 PM Confirm and document reason for call. If symptomatic, describe symptoms. ---Pt is having trouble urinating that came on gradually not emptying 3-4 d ago. No fever.  Disp. Time Lamount Cohen Time) Disposition Final User  07/26/2019 6:38:07 PM Go to ED Now Yes Ladona Ridgel, RN, Sunny Schlein

## 2019-07-27 NOTE — Telephone Encounter (Signed)
Tried to call pt. To advise her to go to ER. no answer, unable to leave message.

## 2019-07-27 NOTE — ED Notes (Signed)
3 different scans shows 0 ml's in bladder, pelvic bone detected each time

## 2019-07-27 NOTE — ED Provider Notes (Signed)
[facility] EM[facility]GENCY DEPARTMENT Provider Note   CSN: [id number] Arrival date & time: 07/27/19  2047     History Chief Complaint  Patient presents with  . Urinary Retention    Judy Weiss is a 74 y.o. female.  The history is provided by the patient.  Urinary Frequency This is a new problem. The current episode started more than 2 days ago. The problem occurs daily. The problem has been gradually worsening. Associated symptoms include abdominal pain. Exacerbated by: urination. Nothing relieves the symptoms. She has tried nothing for the symptoms.  Patient with history of GERD, depression presents with dysuria and frequency.  She reports for the past 4 days she has had urinary frequency with minimal urine output.  She reports a lot of urinary pressure.  No fevers or vomiting.  No noted back pain. She was seen in urgent care and sent here for evaluation for concern for urinary retention.      Past Medical History:  Diagnosis Date  . Allergy    food allergies and some additives. But she does not know what will cause.  . Depression   . GERD (gastroesophageal reflux disease)    had in past but now controlled. Has hiatal hernia.  Marland Kitchen Heart murmur   . History of blood clots 1996?   left leg  . History of colon polyps   . History of hiatal hernia   . Hyperlipidemia     Patient Active Problem List   Diagnosis Date Noted  . Osteopenia 10/13/2017  . Insomnia 06/09/2016  . Alcohol dependence (HCC) 01/13/2016  . Hypoxia 01/11/2016  . 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 Depression 05/02/2015  . GERD (gastroesophageal reflux disease) s/p Nissen fundoplication 01/09/2016 05/02/2015  . History of DVT (deep vein thrombosis) 05/02/2015  . Lichen sclerosus 05/02/2015  . Hyperlipidemia 05/02/2015  . Venous insufficiency 01/06/2011    Past Surgical History:  Procedure Laterality Date  .  APPENDECTOMY  1972  . DILATION AND CURETTAGE OF UTERUS    . HIATAL HERNIA REPAIR  2016  . INSERTION OF MESH N/A 01/09/2016   Procedure: INSERTION OF MESH;  Surgeon: Axel Filler, MD;  Location: [facility];  Service: General;  Laterality: N/A;  . KNEE SURGERY Bilateral    hx of torn meniscus (arthroscopic surgery)  . LEG SURGERY Left 2001   Had veins stripped   . SHOULDER SURGERY Right    reports hx arthroscopic surgery     OB History   No obstetric history on file.     Family History  Problem Relation Age of Onset  . Diabetes Mother   . Hypothyroidism Mother   . Seizures Mother     Social History   Tobacco Use  . Smoking status: Never Smoker  . Smokeless tobacco: Never Used  Substance Use Topics  . Alcohol use: Yes    Comment: 2 cans "hard cider"/day  . Drug use: No    Home Medications Prior to Admission medications   Medication Sig Start Date End Date Taking? Authorizing Provider  aspirin EC 81 MG tablet Take 1 tablet (81 mg total) by mouth daily. Patient taking differently: Take 162 mg by mouth daily. Take 2 tablets by mouth daily. 05/02/15   Sandford Craze, NP  buPROPion (WELLBUTRIN XL) 300 MG 24 hr tablet TAKE 1 TABLET BY MOUTH BY MOUTH EVERY MORNING 05/30/19   Sandford Craze, NP  citalopram (CELEXA) 40 MG tablet Take 1  tablet (40 mg total) by mouth daily. 05/30/19   Sandford Craze, NP  pantoprazole (PROTONIX) 40 MG tablet Take 1 tablet (40 mg total) by mouth daily. 05/30/19   Sandford Craze, NP  vitamin B-12 (CYANOCOBALAMIN) 500 MCG tablet Take 500 mcg by mouth daily.    [provider]  VITAMIN D PO Take 2,000 Units by mouth daily.    [provider]  zolpidem (AMBIEN) 5 MG tablet Take 1 tablet (5 mg total) by mouth at bedtime as needed for sleep. 05/30/19   Sandford Craze, NP    Allergies    Patient has no known allergies.  Review of Systems   Review of Systems  Constitutional: Negative for fever.  Gastrointestinal:  Positive for abdominal pain. Negative for vomiting.  Genitourinary: Positive for difficulty urinating, dysuria, frequency and urgency.  All other systems reviewed and are negative.   Physical Exam Updated Vital Signs BP 109/74 (BP Location: Left Arm)   Pulse 86   Temp 98.4 F (36.9 C) (Oral)   Resp 20   Ht 1.6 m (5\' 3" )   Wt 91.2 kg   SpO2 98%   BMI 35.61 kg/m   Physical Exam CONSTITUTIONAL: Well developed, elderly HEAD: Normocephalic/atraumatic EYES: EOMI/PERRL NECK: supple no meningeal signs SPINE/BACK:entire spine nontender CV: S1/S2 noted, no murmurs/rubs/gallops noted LUNGS: Lungs are clear to auscultation bilaterally, no apparent distress ABDOMEN: soft, nontender, no rebound or guarding, bowel sounds noted throughout abdomen GU:no cva tenderness NEURO: Pt is awake/alert/appropriate, moves all extremitiesx4.  No facial droop.   EXTREMITIES: pulses normal/equal, full ROM SKIN: warm, color normal PSYCH: no abnormalities of mood noted, alert and oriented to situation  ED Results / Procedures / Treatments   Labs (all labs ordered are listed, but only abnormal results are displayed) Labs Reviewed  URINALYSIS, ROUTINE W REFLEX MICROSCOPIC - Abnormal; Notable for the following components:      Result Value   APPearance CLOUDY (*)    Specific Gravity, Urine >1.030 (*)    Hgb urine dipstick MODERATE (*)    Ketones, ur 15 (*)    Protein, ur 30 (*)    Leukocytes,Ua MODERATE (*)    All other components within normal limits  URINALYSIS, MICROSCOPIC (REFLEX) - Abnormal; Notable for the following components:   Bacteria, UA MANY (*)    All other components within normal limits  URINE CULTURE    EKG None  Radiology No results found.  Procedures Procedures Medications Ordered in ED Medications  cephALEXin (KEFLEX) capsule 500 mg (500 mg Oral Given 07/27/19 2355)    ED Course  I have reviewed the triage vital signs and the nursing notes.  Pertinent labs  results  that were available during my care of the patient were reviewed by me and considered in my medical decision making (see chart for details).    MDM Rules/Calculators/A&P                      Patient presents with urinary frequency the past several days.  There was a concern for her retention.  However multiple bladder scans revealed minimal urine in the bladder.  She was able to void spontaneously. Urinalysis reveals UTI.  Patient is in no acute distress. She will be appropriate for outpatient management 12:30 AM Patient improved, no acute distress.  She is not septic appearing.  She is appropriate for outpatient management  MDM Number of Diagnoses or Management Options   Amount and/or Complexity of Data Reviewed Clinical lab tests: ordered  and reviewed Tests in the medicine section of CPT: ordered and reviewed Obtain history from someone other than the patient: no Discuss the patient with other providers: no  Risk of Complications, Morbidity, and/or Mortality Presenting problems: moderate Management options: low  Patient Progress Patient progress: stable   Final Clinical Impression(s) / ED Diagnoses Final diagnoses:  Acute cystitis with hematuria    Rx / DC Orders ED Discharge Orders         Ordered    cephALEXin (KEFLEX) 500 MG capsule  3 times daily     07/28/19 0026           Ripley Fraise, MD 07/28/19 0031

## 2019-07-27 NOTE — Telephone Encounter (Signed)
Looks like patient has not been seen in the ER yet.   I see that she has a video visit with you on 07/30/19.

## 2019-07-27 NOTE — ED Triage Notes (Addendum)
Pt c/o urinary retention x 3-4 days-states she was seen at [facility] and advised to come to ED-NAD-steady gait

## 2019-07-28 MED ORDER — CEPHALEXIN 500 MG PO CAPS: 500.0000 mg | ORAL_CAPSULE | Freq: Three times a day (TID) | ORAL | 0 refills | Status: DC

## 2019-07-29 LAB — URINE CULTURE

## 2019-07-30 ENCOUNTER — Telehealth: Payer: Medicare Other | Admitting: Family

## 2019-07-30 ENCOUNTER — Other Ambulatory Visit: Payer: Self-pay

## 2019-07-30 NOTE — Telephone Encounter (Signed)
Reviewed chart. Pt went to ED and was treated for UTI.

## 2019-08-31 DIAGNOSIS — Z0000 Encounter for general adult medical examination without abnormal findings: Secondary | ICD-10-CM | POA: Diagnosis not present

## 2019-09-10 MED FILL — CITALOPRAM HBR 40 MG TABLET: 40 | 90 days supply | Qty: 90 | Fill #1

## 2019-09-10 MED FILL — buPROPion HCL ER (XL) 300 M: 300 | 90 days supply | Qty: 90 | Fill #1

## 2019-09-10 MED FILL — PANTOPRAZOLE SOD DR 40 MG T: 40 | 90 days supply | Qty: 90 | Fill #1

## 2019-09-28 ENCOUNTER — Other Ambulatory Visit: Payer: Self-pay | Admitting: Family

## 2019-09-28 NOTE — Telephone Encounter (Signed)
Requesting:Zolpidem Contract:none UDS:06/18/2019 Last Visit:05/30/2019 Next Visit:2/10/201 Last Refill:05/30/2019  Please Advise

## 2019-10-12 NOTE — Progress Notes (Deleted)
Subjective:   Judy Weiss is a 74 y.o. female who presents for Medicare Annual (Subsequent) preventive examination.  Review of Systems    ***       Objective:    There were no vitals filed for this visit. There is no height or weight on file to calculate BMI.  Advanced Directives 07/27/2019 10/09/2018 10/06/2017 12/13/2016 01/09/2016 01/06/2016  Does Patient Have a Medical Advance Directive? No No No No No No  Would patient like information on creating a medical advance directive? - No - Patient declined Yes (MAU/Ambulatory/Procedural Areas - Information given) - No - patient declined information No - patient declined information    Current Medications (verified) Outpatient Encounter Medications as of 10/15/2019  Medication Sig  . aspirin EC 81 MG tablet Take 1 tablet (81 mg total) by mouth daily. (Patient taking differently: Take 162 mg by mouth daily. Take 2 tablets by mouth daily.)  . buPROPion (WELLBUTRIN XL) 300 MG 24 hr tablet TAKE 1 TABLET BY MOUTH BY MOUTH EVERY MORNING  . cephALEXin (KEFLEX) 500 MG capsule Take 1 capsule (500 mg total) by mouth 3 (three) times daily.  . citalopram (CELEXA) 40 MG tablet Take 1 tablet (40 mg total) by mouth daily.  . pantoprazole (PROTONIX) 40 MG tablet Take 1 tablet (40 mg total) by mouth daily.  . vitamin B-12 (CYANOCOBALAMIN) 500 MCG tablet Take 500 mcg by mouth daily.  Marland Kitchen VITAMIN D PO Take 2,000 Units by mouth daily.  Marland Kitchen zolpidem (AMBIEN) 5 MG tablet TAKE 1 TABLET (5 MG TOTAL) BY MOUTH AT BEDTIME AS NEEDED FOR SLEEP.   No facility-administered encounter medications on file as of 10/15/2019.    Allergies (verified) Patient has no known allergies.   History: Past Medical History:  Diagnosis Date  . Allergy    food allergies and some additives. But she does not know what will cause.  . Depression   . GERD (gastroesophageal reflux disease)    had in past but now controlled. Has hiatal hernia.  Marland Kitchen Heart murmur   . History of blood clots  1996?   left leg  . History of colon polyps   . History of hiatal hernia   . Hyperlipidemia    Past Surgical History:  Procedure Laterality Date  . APPENDECTOMY  1972  . DILATION AND CURETTAGE OF UTERUS    . HIATAL HERNIA REPAIR  2016  . INSERTION OF MESH N/A 01/09/2016   Procedure: INSERTION OF MESH;  Surgeon: Axel Filler, MD;  Location: [facility];  Service: General;  Laterality: N/A;  . KNEE SURGERY Bilateral    hx of torn meniscus (arthroscopic surgery)  . LEG SURGERY Left 2001   Had veins stripped   . SHOULDER SURGERY Right    reports hx arthroscopic surgery   Family History  Problem Relation Age of Onset  . Diabetes Mother   . Hypothyroidism Mother   . Seizures Mother    Social History   Socioeconomic History  . Marital status: Divorced    Spouse name: Not on file  . Number of children: Not on file  . Years of education: Not on file  . Highest education level: Not on file  Occupational History  . Not on file  Tobacco Use  . Smoking status: Never Smoker  . Smokeless tobacco: Never Used  Vaping Use  . Vaping Use: Never used  Substance and Sexual Activity  . Alcohol use: Yes    Comment: 2 cans "hard cider"/day  . Drug use:  No  . Sexual activity: Not on file  Other Topics Concern  . Not on file  Social History Narrative   Lives alone- can walk to her daughter's home   Daughter in [place]   Son in [place]   Son [place]   4 grandchildren (twin girls age 57)   Retired- Presenter, broadcasting at a Associate Professor x 25 years.   Divorced   4 cats and a dog   Enjoys antiquing.  Buys and sells.  Enjoys shopping   Social Determinants of Health   Financial Resource Strain:   . Difficulty of Paying Living Expenses:   Food Insecurity:   . Worried About Programme researcher, broadcasting/film/video in the Last Year:   . Barista in the Last Year:   Transportation Needs:   . Freight forwarder (Medical):   Marland Kitchen Lack of Transportation (Non-Medical):   Physical Activity:    . Days of Exercise per Week:   . Minutes of Exercise per Session:   Stress:   . Feeling of Stress :   Social Connections:   . Frequency of Communication with Friends and Family:   . Frequency of Social Gatherings with Friends and Family:   . Attends Religious Services:   . Active Member of Clubs or Organizations:   . Attends Banker Meetings:   Marland Kitchen Marital Status:     Tobacco Counseling Counseling given: Not Answered   Clinical Intake:                 Diabetic?***         Activities of Daily Living No flowsheet data found.  Patient Care Team: Sandford Craze, NP as PCP - General (Internal Medicine) Van Clines, MD as Consulting Physician (Neurology)  Indicate any recent Medical Services you may have received from other than [facility] providers in the past year (date may be approximate).     Assessment:   This is a routine wellness examination for Judy Weiss.  Hearing/Vision screen No exam data present  Dietary issues and exercise activities discussed:   Diet (meal preparation, eat out, water intake, caffeinated beverages, dairy products, fruits and vegetables): {Desc; diets:16563} Breakfast: Lunch:  Dinner:      Goals    . DIET - EAT MORE FRUITS AND VEGETABLES    . DIET - INCREASE WATER INTAKE      Depression Screen PHQ 2/9 Scores 10/09/2018 10/06/2017 12/13/2016 09/09/2015 02/20/2015 02/20/2015  PHQ - 2 Score 0 1 2 0 0 0  PHQ- 9 Score - - 6 - - -    Fall Risk Fall Risk  10/31/2018 10/09/2018 03/22/2018 10/06/2017 08/24/2017  Falls in the past year? 0 1 0 No No  Number falls in past yr: 0 0 0 - -  Injury with Fall? 0 0 0 - -  Risk for fall due to : - - - - -  Follow up Falls evaluation completed - Falls evaluation completed - -    Any stairs in or around the home? {YES/NO:21197} If so, are there any without handrails? {YES/NO:21197} Home free of loose throw rugs in walkways, pet beds, electrical cords, etc? {YES/NO:21197} Adequate  lighting in your home to reduce risk of falls? {YES/NO:21197}  ASSISTIVE DEVICES UTILIZED TO PREVENT FALLS:  Life alert? {YES/NO:21197} Use of a cane, walker or w/c? {YES/NO:21197} Grab bars in the bathroom? {YES/NO:21197} Shower chair or bench in shower? {YES/NO:21197} Elevated toilet seat or a handicapped toilet? {YES/NO:21197}  TIMED UP AND  GO:  Was the test performed? {YES/NO:21197}.  Length of time to ambulate 10 feet: *** sec.   {Appearance of AJOI:7867672}  Cognitive Function: Ad8 score reviewed for issues:  Issues making decisions:  Less interest in hobbies / activities:  Repeats questions, stories (family complaining):  Trouble using ordinary gadgets (microwave, computer, phone):  Forgets the month or year:   Mismanaging finances:   Remembering appts:  Daily problems with thinking and/or memory: Ad8 score is=     MMSE - Mini Mental State Exam 10/06/2017 08/24/2017  Orientation to time 5 5  Orientation to Place 5 5  Registration 3 3  Attention/ Calculation 5 4  Recall 3 2  Language- name 2 objects 2 2  Language- repeat 1 1  Language- follow 3 step command 3 3  Language- read & follow direction 1 1  Write a sentence 1 1  Copy design 1 1  Total score 30 28   Montreal Cognitive Assessment  10/31/2018 03/25/2017  Visuospatial/ Executive (0/5) 5 5  Naming (0/3) 3 3  Attention: Read list of digits (0/2) 2 2  Attention: Read list of letters (0/1) 1 1  Attention: Serial 7 subtraction starting at 100 (0/3) 2 3  Language: Repeat phrase (0/2) 2 2  Language : Fluency (0/1) 0 0  Abstraction (0/2) 2 2  Delayed Recall (0/5) 4 4  Orientation (0/6) 6 6  Total 27 28      Immunizations Immunization History  Administered Date(s) Administered  . Influenza, High Dose Seasonal PF 07/04/2018  . PFIZER SARS-COV-2 Vaccination 05/10/2019  . Pneumococcal Conjugate-13 07/04/2018    {TDAP status:2101805} Flu Vaccine status: Up to date {Pneumococcal vaccine  status:2101807} {Covid-19 vaccine status:2101808}  Qualifies for Shingles Vaccine? {YES/NO:21197}  Zostavax completed {YES/NO:21197}  {Shingrix Completed?:2101804}  Screening Tests Health Maintenance  Topic Date Due  . Hepatitis C Screening  Never done  . TETANUS/TDAP  Never done  . COVID-19 Vaccine (2 - Pfizer 2-dose series) 05/31/2019  . PNA vac Low Risk Adult (2 of 2 - PPSV23) 07/04/2019  . INFLUENZA VACCINE  11/18/2019  . DEXA SCAN  Completed    Health Maintenance  Health Maintenance Due  Topic Date Due  . Hepatitis C Screening  Never done  . TETANUS/TDAP  Never done  . COVID-19 Vaccine (2 - Pfizer 2-dose series) 05/31/2019  . PNA vac Low Risk Adult (2 of 2 - PPSV23) 07/04/2019    Colorectal cancer screening: Completed Cologuard 12/23/17. Repeat every 3 years {Mammogram status:21018020} {Bone Density status:21018021}  Lung Cancer Screening: (Low Dose CT Chest recommended if Age 71-80 years, 30 pack-year currently smoking OR have quit w/in 15years.) does not qualify.    Additional Screening:   Vision Screening: Recommended annual ophthalmology exams for early detection of glaucoma and other disorders of the eye. Is the patient up to date with their annual eye exam?  {YES/NO:21197} Who is the provider or what is the name of the office in which the patient attends annual eye exams? *** If pt is not established with a provider, would they like to be referred to a provider to establish care? {YES/NO:21197}.   Dental Screening: Recommended annual dental exams for proper oral hygiene  Community Resource Referral / Chronic Care Management: CRR required this visit?  {YES/NO:21197}  CCM required this visit?  {YES/NO:21197}     Plan:     I have personally reviewed and noted the following in the patient's chart:   . Medical and social history . Use of alcohol, tobacco  or illicit drugs  . Current medications and supplements . Functional ability and  status . Nutritional status . Physical activity . Advanced directives . List of other physicians . Hospitalizations, surgeries, and ER visits in previous 12 months . Vitals . Screenings to include cognitive, depression, and falls . Referrals and appointments  In addition, I have reviewed and discussed with patient certain preventive protocols, quality metrics, and best practice recommendations. A written personalized care plan for preventive services as well as general preventive health recommendations were provided to patient.     Shela Nevin, [place]   10/12/2019   Nurse Notes: lives alone with her pets. Dtr lives w/i walking distance.

## 2019-10-15 ENCOUNTER — Ambulatory Visit: Payer: Medicare Other | Admitting: *Deleted

## 2019-11-27 ENCOUNTER — Other Ambulatory Visit: Payer: Self-pay | Admitting: Family

## 2019-11-27 MED FILL — PANTOPRAZOLE SOD DR 40 MG T: 40 | 90 days supply | Qty: 90 | Fill #0

## 2020-01-01 ENCOUNTER — Other Ambulatory Visit: Payer: Self-pay | Admitting: Family

## 2020-01-01 MED FILL — CITALOPRAM HBR 40 MG TABLET: 40 | 90 days supply | Qty: 90 | Fill #0

## 2020-01-01 MED FILL — buPROPion HCL ER (XL) 300 M: 300 | 90 days supply | Qty: 90 | Fill #0

## 2020-01-02 DIAGNOSIS — Z0120 Encounter for dental examination and cleaning without abnormal findings: Secondary | ICD-10-CM | POA: Diagnosis not present

## 2020-02-28 MED FILL — PANTOPRAZOLE SOD DR 40 MG T: 40 | 90 days supply | Qty: 90 | Fill #1

## 2020-03-26 ENCOUNTER — Other Ambulatory Visit: Payer: Self-pay | Admitting: Family

## 2020-03-26 NOTE — Telephone Encounter (Signed)
Requesting: Contract: 05/31/2019 UDS: 06/18/2019 Last Visit: 05/30/2019 Next Visit: None scheduled  Last Refill: 09/28/2019 #30 and 0RF  Please Advise

## 2020-03-27 ENCOUNTER — Other Ambulatory Visit: Payer: Self-pay | Admitting: Family

## 2020-03-27 MED FILL — ZOLPIDEM TARTRATE 5 MG TAB: 5 | 30 days supply | Qty: 30 | Fill #0

## 2020-03-27 MED FILL — CITALOPRAM HBR 40 MG TABLET: 40 | 90 days supply | Qty: 90 | Fill #1

## 2020-03-27 MED FILL — buPROPion HCL ER (XL) 300 M: 300 | 90 days supply | Qty: 90 | Fill #1

## 2020-03-27 NOTE — Telephone Encounter (Signed)
Refill sent, however pt is overdue for follow up. Please schedule follow up visit.

## 2020-04-08 ENCOUNTER — Other Ambulatory Visit: Payer: Self-pay

## 2020-04-08 ENCOUNTER — Other Ambulatory Visit: Payer: Self-pay | Admitting: Family

## 2020-04-08 ENCOUNTER — Ambulatory Visit (INDEPENDENT_AMBULATORY_CARE_PROVIDER_SITE_OTHER): Payer: Medicare Other | Admitting: Family

## 2020-04-08 ENCOUNTER — Encounter: Payer: Self-pay | Admitting: Family

## 2020-04-08 DIAGNOSIS — G4700 Insomnia, unspecified: Secondary | ICD-10-CM

## 2020-04-08 DIAGNOSIS — G8929 Other chronic pain: Secondary | ICD-10-CM

## 2020-04-08 DIAGNOSIS — Z23 Encounter for immunization: Secondary | ICD-10-CM | POA: Diagnosis not present

## 2020-04-08 DIAGNOSIS — F32A Depression, unspecified: Secondary | ICD-10-CM

## 2020-04-08 DIAGNOSIS — R269 Unspecified abnormalities of gait and mobility: Secondary | ICD-10-CM | POA: Diagnosis not present

## 2020-04-08 DIAGNOSIS — D649 Anemia, unspecified: Secondary | ICD-10-CM

## 2020-04-08 DIAGNOSIS — K219 Gastro-esophageal reflux disease without esophagitis: Secondary | ICD-10-CM

## 2020-04-08 DIAGNOSIS — M545 Low back pain, unspecified: Secondary | ICD-10-CM

## 2020-04-08 DIAGNOSIS — R739 Hyperglycemia, unspecified: Secondary | ICD-10-CM

## 2020-04-08 MED ORDER — MELOXICAM 7.5 MG PO TABS: 7.5000 mg | ORAL_TABLET | Freq: Every day | ORAL | 0 refills | Status: DC | PRN

## 2020-04-08 MED FILL — MELOXICAM 7.5 MG TABLET: 7.5 | 7 days supply | Qty: 14 | Fill #0

## 2020-04-08 NOTE — Progress Notes (Signed)
Subjective:    Patient ID: Judy Weiss, female    DOB: 16-Mar-1946, 74 y.o.   MRN: [id number]  HPI  Patient is a 74 yr old female who presents today for routine follow up.  Depression- medications include wellbutrin xl 300 mg and citalopram 40 mg once daily.  Reports fair mood.   Insomnia- uses ambien prn.  Reports that she does not take very often.  She uses benadryl most nights.  She reports some right hip pain. Does not have pain every day.  Notes soreness in the right hip and sometimes has pain radiating down the front of the right leg.  She has some chronic low back pain off/and on.    She reports that her balance has been worsening over the last year.  She takes a cane when she walks now which is new.   GERD- Reports food "comes back up".  Has burping if she overeats.      Review of Systems See HPI  Past Medical History:  Diagnosis Date  . Allergy    food allergies and some additives. But she does not know what will cause.  . Depression   . GERD (gastroesophageal reflux disease)    had in past but now controlled. Has hiatal hernia.  Marland Kitchen Heart murmur   . History of blood clots 1996?   left leg  . History of colon polyps   . History of hiatal hernia   . Hyperlipidemia      Social History   Socioeconomic History  . Marital status: Divorced    Spouse name: Not on file  . Number of children: Not on file  . Years of education: Not on file  . Highest education level: Not on file  Occupational History  . Not on file  Tobacco Use  . Smoking status: Never Smoker  . Smokeless tobacco: Never Used  Vaping Use  . Vaping Use: Never used  Substance and Sexual Activity  . Alcohol use: Yes    Comment: 2 cans "hard cider"/day  . Drug use: No  . Sexual activity: Not on file  Other Topics Concern  . Not on file  Social History Narrative   Lives alone- can walk to her daughter's home   Daughter in [place]   Son in [place]   Son [place]   4 grandchildren  (twin girls age 80)   Retired- Presenter, broadcasting at a Associate Professor x 25 years.   Divorced   4 cats and a dog   Enjoys antiquing.  Buys and sells.  Enjoys shopping   Social Determinants of Health   Financial Resource Strain: Not on file  Food Insecurity: Not on file  Transportation Needs: Not on file  Physical Activity: Not on file  Stress: Not on file  Social Connections: Not on file  Intimate Partner Violence: Not on file    Past Surgical History:  Procedure Laterality Date  . APPENDECTOMY  1972  . DILATION AND CURETTAGE OF UTERUS    . HIATAL HERNIA REPAIR  2016  . INSERTION OF MESH N/A 01/09/2016   Procedure: INSERTION OF MESH;  Surgeon: Axel Filler, MD;  Location: [facility];  Service: General;  Laterality: N/A;  . KNEE SURGERY Bilateral    hx of torn meniscus (arthroscopic surgery)  . LEG SURGERY Left 2001   Had veins stripped   . SHOULDER SURGERY Right    reports hx arthroscopic surgery    Family History  Problem Relation Age of  Onset  . Diabetes Mother   . Hypothyroidism Mother   . Seizures Mother     No Known Allergies  Current Outpatient Medications on File Prior to Visit  Medication Sig Dispense Refill  . aspirin EC 81 MG tablet Take 1 tablet (81 mg total) by mouth daily. (Patient taking differently: Take 162 mg by mouth daily. Take 2 tablets by mouth daily.)    . buPROPion (WELLBUTRIN XL) 300 MG 24 hr tablet TAKE 1 TABLET BY MOUTH BY MOUTH EVERY MORNING 90 tablet 1  . citalopram (CELEXA) 40 MG tablet TAKE 1 TABLET (40 MG TOTAL) BY MOUTH DAILY. 90 tablet 1  . pantoprazole (PROTONIX) 40 MG tablet TAKE 1 TABLET (40 MG TOTAL) BY MOUTH DAILY. 90 tablet 1  . vitamin B-12 (CYANOCOBALAMIN) 500 MCG tablet Take 500 mcg by mouth daily.    Marland Kitchen VITAMIN D PO Take 2,000 Units by mouth daily.    Marland Kitchen zolpidem (AMBIEN) 5 MG tablet TAKE 1 TABLET BY MOUTH EVERY NIGHT AT BEDTIME AS NEEDED FOR SLEEP 30 tablet 0   No current facility-administered medications on file prior to  visit.    BP (!) 142/68 (BP Location: Right Arm, Patient Position: Sitting, Cuff Size: Large)   Pulse 74   Temp 98.3 F (36.8 C) (Oral)   Resp 16   Ht 5\' 2"  (1.575 m)   Wt 200 lb (90.7 kg)   SpO2 97%   BMI 36.58 kg/m       Objective:   Physical Exam Constitutional:      Appearance: She is well-developed and well-nourished.  Neck:     Thyroid: No thyromegaly.  Cardiovascular:     Rate and Rhythm: Normal rate and regular rhythm.     Heart sounds: Normal heart sounds. No murmur heard.   Pulmonary:     Effort: Pulmonary effort is normal. No respiratory distress.     Breath sounds: Normal breath sounds. No wheezing.  Musculoskeletal:     Cervical back: Neck supple.  Skin:    General: Skin is warm and dry.  Neurological:     Mental Status: She is alert and oriented to person, place, and time.     Comments: Narrow based gait, slightly unsteady  Psychiatric:        Mood and Affect: Mood and affect normal.        Behavior: Behavior normal.        Thought Content: Thought content normal.        Judgment: Judgment normal.           Assessment & Plan:  Lumbar radiculopathy- trial of meloxicam.  PT referral for low back pain. 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   Depression- overall appears stable. Continue wellbutrin xl 300 mg and citalopram 40 mg once daily.  Insomnia- recommended sparing use of ambien. Discussed that even benadryl can increase her risk of falls and is not ideal. Controlled substance contract is updated and UDS will be completed today.  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  Recommended that she use cane for stability regularly.   GERD- discussed importance of not eating large meals and not eating late at night. Continue protonix 40mg  once daily.  Hyperglycemia- check follow up A1c.   This visit occurred during the SARS-CoV-2 public health emergency.  Safety protocols were in place, including  screening questions prior to the visit, additional usage of staff PPE, and extensive cleaning of exam room  while observing appropriate contact time as indicated for disinfecting solutions.

## 2020-04-08 NOTE — Patient Instructions (Signed)
Please complete lab work prior to leaving.  You should be contacted about scheduling your appointment for physical therapy.

## 2020-04-09 ENCOUNTER — Other Ambulatory Visit: Payer: Medicare Other

## 2020-04-09 LAB — CBC WITH DIFFERENTIAL/PLATELET
Basophils Absolute: 0 10*3/uL (ref 0.0–0.1)
Basophils Relative: 0.6 % (ref 0.0–3.0)
Eosinophils Absolute: 0.2 10*3/uL (ref 0.0–0.7)
Eosinophils Relative: 5.5 % — ABNORMAL HIGH (ref 0.0–5.0)
HCT: 39.8 % (ref 36.0–46.0)
Hemoglobin: 13.2 g/dL (ref 12.0–15.0)
Lymphocytes Relative: 26 % (ref 12.0–46.0)
Lymphs Abs: 1.2 10*3/uL (ref 0.7–4.0)
MCHC: 33.2 g/dL (ref 30.0–36.0)
MCV: 88.1 fl (ref 78.0–100.0)
Monocytes Absolute: 0.3 10*3/uL (ref 0.1–1.0)
Monocytes Relative: 7 % (ref 3.0–12.0)
Neutro Abs: 2.8 10*3/uL (ref 1.4–7.7)
Neutrophils Relative %: 60.9 % (ref 43.0–77.0)
Platelets: 232 10*3/uL (ref 150.0–400.0)
RBC: 4.52 Mil/uL (ref 3.87–5.11)
RDW: 13.1 % (ref 11.5–15.5)
WBC: 4.6 10*3/uL (ref 4.0–10.5)

## 2020-04-09 LAB — COMPREHENSIVE METABOLIC PANEL
ALT: 10 U/L (ref 0–35)
AST: 13 U/L (ref 0–37)
Albumin: 4.2 g/dL (ref 3.5–5.2)
Alkaline Phosphatase: 55 U/L (ref 39–117)
BUN: 19 mg/dL (ref 6–23)
CO2: 31 mEq/L (ref 19–32)
Calcium: 9.1 mg/dL (ref 8.4–10.5)
Chloride: 104 mEq/L (ref 96–112)
Creatinine, Ser: 0.71 mg/dL (ref 0.40–1.20)
GFR: 82.3 mL/min (ref >60.00)
Glucose, Bld: 102 mg/dL — ABNORMAL HIGH (ref 70–99)
Potassium: 4.7 mEq/L (ref 3.5–5.1)
Sodium: 140 mEq/L (ref 135–145)
Total Bilirubin: 0.7 mg/dL (ref 0.2–1.2)
Total Protein: 6.7 g/dL (ref 6.0–8.3)

## 2020-04-09 LAB — HEMOGLOBIN A1C: Hgb A1c MFr Bld: 5.7 % (ref 4.6–6.5)

## 2020-04-10 ENCOUNTER — Encounter: Payer: Self-pay | Admitting: Family

## 2020-04-10 NOTE — Progress Notes (Signed)
Mailed out to pt 

## 2020-04-29 ENCOUNTER — Ambulatory Visit: Payer: Medicare Other | Admitting: Physical Therapy

## 2020-05-21 ENCOUNTER — Telehealth: Payer: Self-pay | Admitting: Family

## 2020-05-21 NOTE — Telephone Encounter (Signed)
NEW PHARMACY Medication: pantoprazole (PROTONIX) 40 MG tablet [[id number]]       Has the patient contacted their pharmacy?  (If no, request that the patient contact the pharmacy for the refill.) (If yes, when and what did the pharmacy advise?)     Preferred Pharmacy (with phone number or street name):     [id number] - [place], [place] - [address] AT [facility] [address] Phone:  [phone]  Fax:  [phone]       Agent: Please be advised that RX refills may take up to 3 business days. We ask that you follow-up with your pharmacy.

## 2020-05-22 MED ORDER — PANTOPRAZOLE SODIUM 40 MG PO TBEC: 40.0000 mg | DELAYED_RELEASE_TABLET | Freq: Every day | ORAL | 1 refills | Status: DC

## 2020-07-25 ENCOUNTER — Telehealth: Payer: Self-pay

## 2020-07-25 MED ORDER — CITALOPRAM HYDROBROMIDE 40 MG PO TABS: ORAL_TABLET | Freq: Every day | ORAL | 1 refills | Status: DC

## 2020-07-25 MED ORDER — BUPROPION HCL ER (XL) 300 MG PO TB24: ORAL_TABLET | ORAL | 1 refills | Status: DC

## 2020-07-25 MED ORDER — ZOLPIDEM TARTRATE 5 MG PO TABS: ORAL_TABLET | Freq: Every evening | ORAL | 0 refills | Status: DC | PRN

## 2020-07-25 NOTE — Telephone Encounter (Signed)
Caller states she needs her Citalopram,Bupropion 300mg  and Zolpidem 5mg . Sent to [phone].

## 2020-07-25 NOTE — Telephone Encounter (Signed)
Last RX: Ambien 03/27/2020 Last OV:04/08/20 Next OV: no future appt. Due in June UDS12-21-21: CSC:21-21-21 CSR: no discrepncies  Other refills sent.

## 2020-08-19 ENCOUNTER — Other Ambulatory Visit: Payer: Self-pay | Admitting: Family

## 2020-11-07 NOTE — Progress Notes (Signed)
Subjective:   Judy Weiss is a 75 y.o. female who presents for Medicare Annual (Subsequent) preventive examination.   I connected with Aissa today by telephone and verified that I am speaking with the correct person using two identifiers. Location patient: home Location provider: work Persons participating in the virtual visit: patient, Engineer, civil (consulting).    I discussed the limitations, risks, security and privacy concerns of performing an evaluation and management service by telephone and the availability of in person appointments. I also discussed with the patient that there may be a patient responsible charge related to this service. The patient expressed understanding and verbally consented to this telephonic visit.    Interactive audio and video telecommunications were attempted between this provider and patient, however failed, due to patient having technical difficulties OR patient did not have access to video capability.  We continued and completed visit with audio only.  Some vital signs may be absent or patient reported.   Time Spent with patient on telephone encounter: 20 minutes  Review of Systems     Cardiac Risk Factors include: advanced age (>58men, >11 women);dyslipidemia;obesity (BMI >30kg/m2)     Objective:    Today's Vitals   11/10/20 1422 11/10/20 1423  Weight: 200 lb (90.7 kg)   Height:  (1.575 m)   PainSc:  3    Body mass index is 36.58 kg/m.  Advanced Directives 11/10/2020 07/27/2019 10/09/2018 10/06/2017 12/13/2016 01/09/2016 01/06/2016  Does Patient Have a Medical Advance Directive? No No No No No No No  Would patient like information on creating a medical advance directive? No - Patient declined - No - Patient declined Yes (MAU/Ambulatory/Procedural Areas - Information given) - No - patient declined information No - patient declined information    Current Medications (verified) Outpatient Encounter Medications as of 11/10/2020  Medication Sig   aspirin EC 81  MG tablet Take 1 tablet (81 mg total) by mouth daily. (Patient taking differently: Take 162 mg by mouth daily. Take 2 tablets by mouth daily.)   buPROPion (WELLBUTRIN XL) 300 MG 24 hr tablet TAKE 1 TABLET BY MOUTH BY MOUTH EVERY MORNING   citalopram (CELEXA) 40 MG tablet TAKE 1 TABLET BY MOUTH ONCE DAILY   meloxicam (MOBIC) 7.5 MG tablet TAKE 1 TABLET BY MOUTH TWICE DAILY AS NEEDED FOR PAIN   pantoprazole (PROTONIX) 40 MG tablet TAKE 1 TABLET(40 MG) BY MOUTH DAILY   vitamin B-12 (CYANOCOBALAMIN) 500 MCG tablet Take 500 mcg by mouth daily.   VITAMIN D PO Take 2,000 Units by mouth daily.   zolpidem (AMBIEN) 5 MG tablet TAKE 1 TABLET BY MOUTH EVERY NIGHT AT BEDTIME AS NEEDED FOR SLEEP   No facility-administered encounter medications on file as of 11/10/2020.    Allergies (verified) Patient has no known allergies.   History: Past Medical History:  Diagnosis Date   Allergy    food allergies and some additives. But she does not know what will cause.   Depression    GERD (gastroesophageal reflux disease)    had in past but now controlled. Has hiatal hernia.   Heart murmur    History of blood clots 1996?   left leg   History of colon polyps    History of hiatal hernia    Hyperlipidemia    Past Surgical History:  Procedure Laterality Date   APPENDECTOMY  1972   DILATION AND CURETTAGE OF UTERUS     HIATAL HERNIA REPAIR  2016   INSERTION OF MESH N/A 01/09/2016   Procedure:  INSERTION OF MESH;  Surgeon: Axel Filler, MD;  Location: [facility];  Service: General;  Laterality: N/A;   KNEE SURGERY Bilateral    hx of torn meniscus (arthroscopic surgery)   LEG SURGERY Left 2001   Had veins stripped    SHOULDER SURGERY Right    reports hx arthroscopic surgery   Family History  Problem Relation Age of Onset   Diabetes Mother    Hypothyroidism Mother    Seizures Mother    Social History   Socioeconomic History   Marital status: Divorced    Spouse name: Not on file   Number of children:  Not on file   Years of education: Not on file   Highest education level: Not on file  Occupational History   Not on file  Tobacco Use   Smoking status: Never   Smokeless tobacco: Never  Vaping Use   Vaping Use: Never used  Substance and Sexual Activity   Alcohol use: Yes    Comment: 2 cans "hard cider"/day   Drug use: No   Sexual activity: Not on file  Other Topics Concern   Not on file  Social History Narrative   Lives alone- can walk to her daughter's home   Daughter in [place]   Son in [place]   Son [place]   4 grandchildren (twin girls age 50)   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   Divorced   4 cats and a dog   Enjoys antiquing.  Buys and sells.  Enjoys shopping   Social Determinants of Health   Financial Resource Strain: Low Risk    Difficulty of Paying Living Expenses: Not hard at all  Food Insecurity: No Food Insecurity   Worried About Programme researcher, broadcasting/film/video in the Last Year: Never true   Barista in the Last Year: Never true  Transportation Needs: No Transportation Needs   Lack of Transportation (Medical): No   Lack of Transportation (Non-Medical): No  Physical Activity: Inactive   Days of Exercise per Week: 0 days   Minutes of Exercise per Session: 0 min  Stress: No Stress Concern Present   Feeling of Stress : Not at all  Social Connections: Socially Isolated   Frequency of Communication with Friends and Family: More than three times a week   Frequency of Social Gatherings with Friends and Family: More than three times a week   Attends Religious Services: Never   Database administrator or Organizations: No   Attends Engineer, structural: Never   Marital Status: Divorced    Tobacco Counseling Counseling given: Not Answered   Clinical Intake:  Pre-visit preparation completed: Yes  Pain : 0-10 Pain Score: 3  Pain Type: Chronic pain Pain Location: Knee Pain Orientation: Right Pain Onset: More  than a month ago Pain Frequency: Intermittent     Nutritional Status: BMI > 30  Obese Nutritional Risks: None Diabetes: No  How often do you need to have someone help you when you read instructions, pamphlets, or other written materials from your doctor or pharmacy?: 1 - Never  Diabetic?No  Interpreter Needed?: No  Information entered by :: Vernice Jefferson LPN   Activities of Daily Living In your present state of health, do you have any difficulty performing the following activities: 11/10/2020 04/08/2020  Hearing? N Y  Vision? N N  Difficulty concentrating or making decisions? Y N  Comment occasionally -  Walking or climbing stairs? N  N  Dressing or bathing? N N  Doing errands, shopping? N N  Preparing Food and eating ? N -  Using the Toilet? N -  In the past six months, have you accidently leaked urine? N -  Do you have problems with loss of bowel control? N -  Managing your Medications? N -  Managing your Finances? N -  Housekeeping or managing your Housekeeping? N -  Some recent data might be hidden    Patient Care Team: Sandford Craze'Sullivan, Melissa, NP as PCP - General (Internal Medicine) Van ClinesAquino, Karen M, MD as Consulting Physician (Neurology)  Indicate any recent Medical Services you may have received from other than [facility] providers in the past year (date may be approximate).     Assessment:   This is a routine wellness examination for Petrice.  Hearing/Vision screen Hearing Screening - Comments:: Bilateral hearing aids Vision Screening - Comments:: Last eye exam-3 years ago  Dietary issues and exercise activities discussed: Current Exercise Habits: The patient does not participate in regular exercise at present, Exercise limited by: orthopedic condition(s) (knee pain)   Goals Addressed             This Visit's Progress    DIET - INCREASE WATER INTAKE   Not on track      Depression Screen PHQ 2/9 Scores 11/10/2020 04/08/2020 10/09/2018 10/06/2017 12/13/2016  09/09/2015 02/20/2015  PHQ - 2 Score 0 2 0 1 2 0 0  PHQ- 9 Score - 7 - - 6 - -    Fall Risk Fall Risk  11/10/2020 04/08/2020 10/31/2018 10/09/2018 03/22/2018  Falls in the past year? 1 1 0 1 0  Number falls in past yr: 1 1 0 0 0  Injury with Fall? 1 0 0 0 0  Risk for fall due to : History of fall(s);Impaired balance/gait - - - -  Follow up Falls prevention discussed - Falls evaluation completed - Falls evaluation completed    FALL RISK PREVENTION PERTAINING TO THE HOME:  Any stairs in or around the home? Yes  If so, are there any without handrails? No  Home free of loose throw rugs in walkways, pet beds, electrical cords, etc? Yes  Adequate lighting in your home to reduce risk of falls? Yes   ASSISTIVE DEVICES UTILIZED TO PREVENT FALLS:  Life alert? No  Use of a cane, walker or w/c? Yes cane occasionally Grab bars in the bathroom? Yes  Shower chair or bench in shower? No  Elevated toilet seat or a handicapped toilet? No   TIMED UP AND GO:  Was the test performed? No . Phone visit   Cognitive Function:Normal cognitive status assessed by this Nurse Health Advisor. No abnormalities found.   MMSE - Mini Mental State Exam 10/06/2017 08/24/2017  Orientation to time 5 5  Orientation to Place 5 5  Registration 3 3  Attention/ Calculation 5 4  Recall 3 2  Language- name 2 objects 2 2  Language- repeat 1 1  Language- follow 3 step command 3 3  Language- read & follow direction 1 1  Write a sentence 1 1  Copy design 1 1  Total score 30 28   Montreal Cognitive Assessment  10/31/2018 03/25/2017  Visuospatial/ Executive (0/5) 5 5  Naming (0/3) 3 3  Attention: Read list of digits (0/2) 2 2  Attention: Read list of letters (0/1) 1 1  Attention: Serial 7 subtraction starting at 100 (0/3) 2 3  Language: Repeat phrase (0/2) 2 2  Language :  Fluency (0/1) 0 0  Abstraction (0/2) 2 2  Delayed Recall (0/5) 4 4  Orientation (0/6) 6 6  Total 27 28   6CIT Screen 11/10/2020  What Year? 0  points  What month? 0 points  What time? 0 points  Count back from 20 0 points  Months in reverse 0 points  Repeat phrase 2 points  Total Score 2    Immunizations Immunization History  Administered Date(s) Administered   Fluad Quad(high Dose 65+) 04/08/2020   Influenza, High Dose Seasonal PF 07/04/2018   PFIZER(Purple Top)SARS-COV-2 Vaccination 05/10/2019, 05/31/2019, 02/06/2020, 11/03/2020   Pneumococcal Conjugate-13 07/04/2018    TDAP status: Due, Education has been provided regarding the importance of this vaccine. Advised may receive this vaccine at local pharmacy or Health Dept. Aware to provide a copy of the vaccination record if obtained from local pharmacy or Health Dept. Verbalized acceptance and understanding.  Flu Vaccine status: Up to date  Pneumococcal vaccine status: Due, Education has been provided regarding the importance of this vaccine. Advised may receive this vaccine at local pharmacy or Health Dept. Aware to provide a copy of the vaccination record if obtained from local pharmacy or Health Dept. Verbalized acceptance and understanding.  Covid-19 vaccine status: Completed vaccines  Qualifies for Shingles Vaccine? Yes   Zostavax completed No   Shingrix Completed?: No.    Education has been provided regarding the importance of this vaccine. Patient has been advised to call insurance company to determine out of pocket expense if they have not yet received this vaccine. Advised may also receive vaccine at local pharmacy or Health Dept. Verbalized acceptance and understanding.  Screening Tests Health Maintenance  Topic Date Due   Hepatitis C Screening  Never done   TETANUS/TDAP  Never done   Zoster Vaccines- Shingrix (1 of 2) Never done   PNA vac Low Risk Adult (2 of 2 - PPSV23) 04/08/2021 (Originally 07/04/2019)   INFLUENZA VACCINE  11/17/2020   COVID-19 Vaccine (5 - Booster for Pfizer series) 03/06/2021   DEXA SCAN  Completed   HPV VACCINES  Aged Out     Health Maintenance  Health Maintenance Due  Topic Date Due   Hepatitis C Screening  Never done   TETANUS/TDAP  Never done   Zoster Vaccines- Shingrix (1 of 2) Never done    Colorectal cancer screening: No longer required.   Mammogram status: Ordered today. Pt provided with contact info and advised to call to schedule appt.   Bone Density status: Ordered today. Pt provided with contact info and advised to call to schedule appt.  Lung Cancer Screening: (Low Dose CT Chest recommended if Age 21-80 years, 30 pack-year currently smoking OR have quit w/in 15years.) does not qualify.     Additional Screening:  Hepatitis C Screening: does qualify; Discuss with PCP  Vision Screening: Recommended annual ophthalmology exams for early detection of glaucoma and other disorders of the eye. Is the patient up to date with their annual eye exam?  No  Who is the provider or what is the name of the office in which the patient attends annual eye exams? unsure If pt is not established with a provider, would they like to be referred to a provider to establish care? No .   Dental Screening: Recommended annual dental exams for proper oral hygiene  Community Resource Referral / Chronic Care Management: CRR required this visit?  No   CCM required this visit?  No      Plan:  I have personally reviewed and noted the following in the patient's chart:   Medical and social history Use of alcohol, tobacco or illicit drugs  Current medications and supplements including opioid prescriptions.  Functional ability and status Nutritional status Physical activity Advanced directives List of other physicians Hospitalizations, surgeries, and ER visits in previous 12 months Vitals Screenings to include cognitive, depression, and falls Referrals and appointments  In addition, I have reviewed and discussed with patient certain preventive protocols, quality metrics, and best practice  recommendations. A written personalized care plan for preventive services as well as general preventive health recommendations were provided to patient.   Due to this being a telephonic visit, the after visit summary with patients personalized plan was offered to patient via mail or my-chart. Patient would like to access on my-chart.   Roanna Raider, LPN   [id number]  Nurse Health Advisor  Nurse Notes: None

## 2020-11-10 ENCOUNTER — Ambulatory Visit (INDEPENDENT_AMBULATORY_CARE_PROVIDER_SITE_OTHER): Payer: Medicare Other

## 2020-11-10 VITALS — Ht 62.0 in | Wt 200.0 lb

## 2020-11-10 DIAGNOSIS — Z780 Asymptomatic menopausal state: Secondary | ICD-10-CM | POA: Diagnosis not present

## 2020-11-10 DIAGNOSIS — Z1231 Encounter for screening mammogram for malignant neoplasm of breast: Secondary | ICD-10-CM | POA: Diagnosis not present

## 2020-11-10 DIAGNOSIS — Z0000 Encounter for general adult medical examination without abnormal findings: Secondary | ICD-10-CM

## 2020-11-10 NOTE — Patient Instructions (Signed)
Judy Weiss , Thank you for taking time to complete your Medicare Wellness Visit. I appreciate your ongoing commitment to your health goals. Please review the following plan we discussed and let me know if I can assist you in the future.   Screening recommendations/referrals: Colonoscopy: No longer required  Mammogram: Ordered today. Someone will be calling you to schedule. Bone Density: Ordered today. Someone will be calling you to schedule. Recommended yearly ophthalmology/optometry visit for glaucoma screening and checkup Recommended yearly dental visit for hygiene and checkup  Vaccinations: Influenza vaccine: Up to date Pneumococcal vaccine: Up to date Tdap vaccine: Discuss with pharmacy Shingles vaccine: Discuss with pharmacy   Covid-19:UP to date  Advanced directives: Declined information today  Conditions/risks identified: See problem list  Next appointment: Follow up in one year for your annual wellness visit 11/12/2021 @ 2:20   Preventive Care 77 Years and Older, Female Preventive care refers to lifestyle choices and visits with your health care provider that can promote health and wellness. What does preventive care include? A yearly physical exam. This is also called an annual well check. Dental exams once or twice a year. Routine eye exams. Ask your health care provider how often you should have your eyes checked. Personal lifestyle choices, including: Daily care of your teeth and gums. Regular physical activity. Eating a healthy diet. Avoiding tobacco and drug use. Limiting alcohol use. Practicing safe sex. Taking low-dose aspirin every day. Taking vitamin and mineral supplements as recommended by your health care provider. What happens during an annual well check? The services and screenings done by your health care provider during your annual well check will depend on your age, overall health, lifestyle risk factors, and family history of disease. Counseling   Your health care provider may ask you questions about your: Alcohol use. Tobacco use. Drug use. Emotional well-being. Home and relationship well-being. Sexual activity. Eating habits. History of falls. Memory and ability to understand (cognition). Work and work Astronomer. Reproductive health. Screening  You may have the following tests or measurements: Height, weight, and BMI. Blood pressure. Lipid and cholesterol levels. These may be checked every 5 years, or more frequently if you are over 40 years old. Skin check. Lung cancer screening. You may have this screening every year starting at age 77 if you have a 30-pack-year history of smoking and currently smoke or have quit within the past 15 years. Fecal occult blood test (FOBT) of the stool. You may have this test every year starting at age 34. Flexible sigmoidoscopy or colonoscopy. You may have a sigmoidoscopy every 5 years or a colonoscopy every 10 years starting at age 66. Hepatitis C blood test. Hepatitis B blood test. Sexually transmitted disease (STD) testing. Diabetes screening. This is done by checking your blood sugar (glucose) after you have not eaten for a while (fasting). You may have this done every 1-3 years. Bone density scan. This is done to screen for osteoporosis. You may have this done starting at age 77. Mammogram. This may be done every 1-2 years. Talk to your health care provider about how often you should have regular mammograms. Talk with your health care provider about your test results, treatment options, and if necessary, the need for more tests. Vaccines  Your health care provider may recommend certain vaccines, such as: Influenza vaccine. This is recommended every year. Tetanus, diphtheria, and acellular pertussis (Tdap, Td) vaccine. You may need a Td booster every 10 years. Zoster vaccine. You may need this after age 80. Pneumococcal  13-valent conjugate (PCV13) vaccine. One dose is recommended  after age 39. Pneumococcal polysaccharide (PPSV23) vaccine. One dose is recommended after age 54. Talk to your health care provider about which screenings and vaccines you need and how often you need them. This information is not intended to replace advice given to you by your health care provider. Make sure you discuss any questions you have with your health care provider. Document Released: 05/02/2015 Document Revised: 12/24/2015 Document Reviewed: 02/04/2015 Elsevier Interactive Patient Education  2017 [employer].  Fall Prevention in the Home Falls can cause injuries. They can happen to people of all ages. There are many things you can do to make your home safe and to help prevent falls. What can I do on the outside of my home? Regularly fix the edges of walkways and driveways and fix any cracks. Remove anything that might make you trip as you walk through a door, such as a raised step or threshold. Trim any bushes or trees on the path to your home. Use bright outdoor lighting. Clear any walking paths of anything that might make someone trip, such as rocks or tools. Regularly check to see if handrails are loose or broken. Make sure that both sides of any steps have handrails. Any raised decks and porches should have guardrails on the edges. Have any leaves, snow, or ice cleared regularly. Use sand or salt on walking paths during winter. Clean up any spills in your garage right away. This includes oil or grease spills. What can I do in the bathroom? Use night lights. Install grab bars by the toilet and in the tub and shower. Do not use towel bars as grab bars. Use non-skid mats or decals in the tub or shower. If you need to sit down in the shower, use a plastic, non-slip stool. Keep the floor dry. Clean up any water that spills on the floor as soon as it happens. Remove soap buildup in the tub or shower regularly. Attach bath mats securely with double-sided non-slip rug tape. Do not  have throw rugs and other things on the floor that can make you trip. What can I do in the bedroom? Use night lights. Make sure that you have a light by your bed that is easy to reach. Do not use any sheets or blankets that are too big for your bed. They should not hang down onto the floor. Have a firm chair that has side arms. You can use this for support while you get dressed. Do not have throw rugs and other things on the floor that can make you trip. What can I do in the kitchen? Clean up any spills right away. Avoid walking on wet floors. Keep items that you use a lot in easy-to-reach places. If you need to reach something above you, use a strong step stool that has a grab bar. Keep electrical cords out of the way. Do not use floor polish or wax that makes floors slippery. If you must use wax, use non-skid floor wax. Do not have throw rugs and other things on the floor that can make you trip. What can I do with my stairs? Do not leave any items on the stairs. Make sure that there are handrails on both sides of the stairs and use them. Fix handrails that are broken or loose. Make sure that handrails are as long as the stairways. Check any carpeting to make sure that it is firmly attached to the stairs. Fix any carpet  that is loose or worn. Avoid having throw rugs at the top or bottom of the stairs. If you do have throw rugs, attach them to the floor with carpet tape. Make sure that you have a light switch at the top of the stairs and the bottom of the stairs. If you do not have them, ask someone to add them for you. What else can I do to help prevent falls? Wear shoes that: Do not have high heels. Have rubber bottoms. Are comfortable and fit you well. Are closed at the toe. Do not wear sandals. If you use a stepladder: Make sure that it is fully opened. Do not climb a closed stepladder. Make sure that both sides of the stepladder are locked into place. Ask someone to hold it for  you, if possible. Clearly mark and make sure that you can see: Any grab bars or handrails. First and last steps. Where the edge of each step is. Use tools that help you move around (mobility aids) if they are needed. These include: Canes. Walkers. Scooters. Crutches. Turn on the lights when you go into a dark area. Replace any light bulbs as soon as they burn out. Set up your furniture so you have a clear path. Avoid moving your furniture around. If any of your floors are uneven, fix them. If there are any pets around you, be aware of where they are. Review your medicines with your doctor. Some medicines can make you feel dizzy. This can increase your chance of falling. Ask your doctor what other things that you can do to help prevent falls. This information is not intended to replace advice given to you by your health care provider. Make sure you discuss any questions you have with your health care provider. Document Released: 01/30/2009 Document Revised: 09/11/2015 Document Reviewed: 05/10/2014 Elsevier Interactive Patient Education  2017 [employer].

## 2020-11-18 ENCOUNTER — Ambulatory Visit (INDEPENDENT_AMBULATORY_CARE_PROVIDER_SITE_OTHER): Payer: Medicare Other | Admitting: Family

## 2020-11-18 ENCOUNTER — Ambulatory Visit (HOSPITAL_BASED_OUTPATIENT_CLINIC_OR_DEPARTMENT_OTHER)
Admission: RE | Admit: 2020-11-18 | Discharge: 2020-11-18 | Disposition: A | Discharge status: Home / Self Care | Payer: Medicare Other | Source: Ambulatory Visit | Attending: Family | Admitting: Family

## 2020-11-18 ENCOUNTER — Other Ambulatory Visit: Payer: Self-pay

## 2020-11-18 ENCOUNTER — Encounter: Payer: Self-pay | Admitting: Family

## 2020-11-18 VITALS — BP 124/75 | HR 65 | Temp 98.8°F | Resp 16 | Wt 191.0 lb

## 2020-11-18 DIAGNOSIS — M25561 Pain in right knee: Secondary | ICD-10-CM | POA: Insufficient documentation

## 2020-11-18 DIAGNOSIS — K219 Gastro-esophageal reflux disease without esophagitis: Secondary | ICD-10-CM

## 2020-11-18 DIAGNOSIS — G4700 Insomnia, unspecified: Secondary | ICD-10-CM

## 2020-11-18 DIAGNOSIS — F32A Depression, unspecified: Secondary | ICD-10-CM

## 2020-11-18 DIAGNOSIS — R296 Repeated falls: Secondary | ICD-10-CM

## 2020-11-18 MED ORDER — ZOLPIDEM TARTRATE 5 MG PO TABS: ORAL_TABLET | Freq: Every evening | ORAL | 0 refills | Status: DC | PRN

## 2020-11-18 NOTE — Assessment & Plan Note (Signed)
Fair control. Continue protonix 40mg . Reinforced GERD diet precautions.

## 2020-11-18 NOTE — Patient Instructions (Addendum)
Please use your cane at all times to prevent falls.

## 2020-11-18 NOTE — Assessment & Plan Note (Signed)
Fair control. I think that a lot of her symptoms are being made worse by situational stress. Recommended counseling, but she declined.  Will continue wellbutrin xl 300mg  and celexa 40mg  once daily.

## 2020-11-18 NOTE — Assessment & Plan Note (Signed)
Stable on prn ambien 5mg .  Continue same. Contract up to date, due for UDS.

## 2020-11-18 NOTE — Progress Notes (Signed)
Subjective:     Patient ID: Judy Weiss, female    DOB: [birth year], 75 y.o.   MRN: [id number]  Chief Complaint  Patient presents with   Knee Pain    Complains of right pain knee pain due to falling about 4 times in the last 6 months.     HPI  Patient is in today to discuss knee pain. Pain is located in the right knee. She has fallen 4 times in the last 6 months.   Depression- Notes that she has been feeling more down lately. Her daughter has stopped communicating her. Daughter "left her family." She depended on her daughter for a lot and now she is unavailable to her. She declines counseling referral at this time.  GERD- has some breakthrough sxs despite protonix 40mg .    Insomnia- stable on PRN ambien.   Health Maintenance Due  Topic Date Due   Hepatitis C Screening  Never done   TETANUS/TDAP  Never done   Zoster Vaccines- Shingrix (1 of 2) Never done   INFLUENZA VACCINE  11/17/2020    Past Medical History:  Diagnosis Date   Allergy    food allergies and some additives. But she does not know what will cause.   Depression    GERD (gastroesophageal reflux disease)    had in past but now controlled. Has hiatal hernia.   Heart murmur    History of blood clots 1996?   left leg   History of colon polyps    History of hiatal hernia    Hyperlipidemia     Past Surgical History:  Procedure Laterality Date   APPENDECTOMY  1972   DILATION AND CURETTAGE OF UTERUS     HIATAL HERNIA REPAIR  2016   INSERTION OF MESH N/A 01/09/2016   Procedure: INSERTION OF MESH;  Surgeon: 01/11/2016, MD;  Location: [facility];  Service: General;  Laterality: N/A;   KNEE SURGERY Bilateral    hx of torn meniscus (arthroscopic surgery)   LEG SURGERY Left 2001   Had veins stripped    SHOULDER SURGERY Right    reports hx arthroscopic surgery    Family History  Problem Relation Age of Onset   Diabetes Mother    Hypothyroidism Mother    Seizures Mother     Social History    Socioeconomic History   Marital status: Divorced    Spouse name: Not on file   Number of children: Not on file   Years of education: Not on file   Highest education level: Not on file  Occupational History   Not on file  Tobacco Use   Smoking status: Never   Smokeless tobacco: Never  Vaping Use   Vaping Use: Never used  Substance and Sexual Activity   Alcohol use: Yes    Comment: 2 cans "hard cider"/day   Drug use: No   Sexual activity: Not on file  Other Topics Concern   Not on file  Social History Narrative   Lives alone- can walk to her daughter's home   Daughter in [place]   Son in [place]   Son [place]   4 grandchildren (twin girls age 49)   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   Divorced   4 cats and a dog   Enjoys antiquing.  Buys and sells.  Enjoys shopping   Social Determinants of Health   Financial Resource Strain: Low Risk    Difficulty of Paying Living Expenses:  Not hard at all  Food Insecurity: No Food Insecurity   Worried About Programme researcher, broadcasting/film/video in the Last Year: Never true   Ran Out of Food in the Last Year: Never true  Transportation Needs: No Transportation Needs   Lack of Transportation (Medical): No   Lack of Transportation (Non-Medical): No  Physical Activity: Inactive   Days of Exercise per Week: 0 days   Minutes of Exercise per Session: 0 min  Stress: No Stress Concern Present   Feeling of Stress : Not at all  Social Connections: Socially Isolated   Frequency of Communication with Friends and Family: More than three times a week   Frequency of Social Gatherings with Friends and Family: More than three times a week   Attends Religious Services: Never   Database administrator or Organizations: No   Attends Engineer, structural: Never   Marital Status: Divorced  Catering manager Violence: Not At Risk   Fear of Current or Ex-Partner: No   Emotionally Abused: No   Physically Abused: No   Sexually  Abused: No    Outpatient Medications Prior to Visit  Medication Sig Dispense Refill   aspirin EC 81 MG tablet Take 1 tablet (81 mg total) by mouth daily. (Patient taking differently: Take 162 mg by mouth daily. Take 2 tablets by mouth daily.)     buPROPion (WELLBUTRIN XL) 300 MG 24 hr tablet TAKE 1 TABLET BY MOUTH BY MOUTH EVERY MORNING 90 tablet 1   citalopram (CELEXA) 40 MG tablet TAKE 1 TABLET BY MOUTH ONCE DAILY 90 tablet 1   pantoprazole (PROTONIX) 40 MG tablet TAKE 1 TABLET(40 MG) BY MOUTH DAILY 90 tablet 0   vitamin B-12 (CYANOCOBALAMIN) 500 MCG tablet Take 500 mcg by mouth daily.     VITAMIN D PO Take 2,000 Units by mouth daily.     zolpidem (AMBIEN) 5 MG tablet TAKE 1 TABLET BY MOUTH EVERY NIGHT AT BEDTIME AS NEEDED FOR SLEEP 30 tablet 0   meloxicam (MOBIC) 7.5 MG tablet TAKE 1 TABLET BY MOUTH TWICE DAILY AS NEEDED FOR PAIN 14 tablet 0   No facility-administered medications prior to visit.    No Known Allergies  ROS See HPI    Objective:    Physical Exam Constitutional:      General: She is not in acute distress.    Appearance: Normal appearance. She is well-developed.  HENT:     Head: Normocephalic and atraumatic.     Right Ear: External ear normal.     Left Ear: External ear normal.  Eyes:     General: No scleral icterus. Neck:     Thyroid: No thyromegaly.  Cardiovascular:     Rate and Rhythm: Normal rate and regular rhythm.     Heart sounds: Normal heart sounds. No murmur heard. Pulmonary:     Effort: Pulmonary effort is normal. No respiratory distress.     Breath sounds: Normal breath sounds. No wheezing.  Musculoskeletal:     Cervical back: Neck supple.  Skin:    General: Skin is warm and dry.  Neurological:     Mental Status: She is alert and oriented to person, place, and time.  Psychiatric:        Mood and Affect: Mood normal.        Behavior: Behavior normal.        Thought Content: Thought content normal.        Judgment: Judgment normal.     BP 124/75 (BP  Location: Right Arm, Patient Position: Sitting, Cuff Size: Small)   Pulse 65   Temp 98.8 F (37.1 C) (Oral)   Resp 16   Wt 191 lb (86.6 kg)   SpO2 99%   BMI 34.93 kg/m  Wt Readings from Last 3 Encounters:  11/18/20 191 lb (86.6 kg)  11/10/20 200 lb (90.7 kg)  04/08/20 200 lb (90.7 kg)       Assessment & Plan:   Problem List Items Addressed This Visit       Unprioritized   Insomnia    Stable on prn ambien 5mg .  Continue same. Contract up to date, due for UDS.         Relevant Medications   zolpidem (AMBIEN) 5 MG tablet   Other Relevant Orders   DRUG MONITORING, PANEL 8 WITH CONFIRMATION, URINE   GERD (gastroesophageal reflux disease) s/p Nissen fundoplication 01/09/2016    Fair control. Continue protonix 40mg . Reinforced GERD diet precautions.        Depression    Fair control. I think that a lot of her symptoms are being made worse by situational stress. Recommended counseling, but she declined.  Will continue wellbutrin xl 300mg  and celexa 40mg  once daily.        Other Visit Diagnoses     Falls frequently    -  Primary   Relevant Orders   Ambulatory referral to Physical Therapy   Right knee pain, unspecified chronicity       Relevant Orders   DG Knee Complete 4 Views Right       I have discontinued Chinita Nestle's meloxicam. I am also having her maintain her aspirin EC, vitamin B-12, VITAMIN D PO, buPROPion, citalopram, pantoprazole, and zolpidem.  Meds ordered this encounter  Medications   zolpidem (AMBIEN) 5 MG tablet    Sig: TAKE 1 TABLET BY MOUTH EVERY NIGHT AT BEDTIME AS NEEDED FOR SLEEP    Dispense:  30 tablet    Refill:  0    Order Specific Question:   Supervising Provider    Answer:   01/11/2016 A [[id number]]

## 2020-11-19 ENCOUNTER — Ambulatory Visit: Payer: Medicare Other | Admitting: Family

## 2020-11-19 ENCOUNTER — Encounter (INDEPENDENT_AMBULATORY_CARE_PROVIDER_SITE_OTHER): Payer: Self-pay

## 2020-11-20 LAB — DRUG MONITORING, PANEL 8 WITH CONFIRMATION, URINE
6 Acetylmorphine: NEGATIVE ng/mL (ref <10)
Alcohol Metabolites: POSITIVE ng/mL — AB (ref <500)
Amphetamines: NEGATIVE ng/mL (ref <500)
Benzodiazepines: NEGATIVE ng/mL (ref <100)
Buprenorphine, Urine: NEGATIVE ng/mL (ref <5)
Cocaine Metabolite: NEGATIVE ng/mL (ref <150)
Creatinine: 243 mg/dL (ref >=20.0)
Ethyl Glucuronide (ETG): 6691 ng/mL — ABNORMAL HIGH (ref <500)
Ethyl Sulfate (ETS): 1499 ng/mL — ABNORMAL HIGH (ref <100)
MDMA: NEGATIVE ng/mL (ref <500)
Marijuana Metabolite: NEGATIVE ng/mL (ref <20)
Opiates: NEGATIVE ng/mL (ref <100)
Oxidant: NEGATIVE ug/mL (ref <200)
Oxycodone: NEGATIVE ng/mL (ref <100)
pH: 6.2 (ref 4.5–9.0)

## 2020-11-20 LAB — DM TEMPLATE

## 2020-12-10 ENCOUNTER — Other Ambulatory Visit: Payer: Self-pay

## 2020-12-10 ENCOUNTER — Ambulatory Visit: Payer: Medicare Other | Attending: Family | Admitting: Physical Therapy

## 2020-12-10 ENCOUNTER — Encounter: Payer: Self-pay | Admitting: Physical Therapy

## 2020-12-10 DIAGNOSIS — M25561 Pain in right knee: Secondary | ICD-10-CM | POA: Diagnosis not present

## 2020-12-10 DIAGNOSIS — R296 Repeated falls: Secondary | ICD-10-CM | POA: Diagnosis not present

## 2020-12-10 DIAGNOSIS — M6281 Muscle weakness (generalized): Secondary | ICD-10-CM

## 2020-12-10 DIAGNOSIS — G8929 Other chronic pain: Secondary | ICD-10-CM

## 2020-12-10 DIAGNOSIS — R2689 Other abnormalities of gait and mobility: Secondary | ICD-10-CM | POA: Diagnosis not present

## 2020-12-10 NOTE — Therapy (Signed)
[facility] [address]  [address] [place], [place], [address] Phone: [phone]   Fax:  [phone]  Physical Therapy Evaluation  Patient Details  Name: Judy Weiss MRN: [id number] Date of Birth: 15-Jan-1946 Referring Provider (PT): Sandford Craze   Encounter Date: 12/10/2020   PT End of Session - 12/10/20 1647     Visit Number 1    Number of Visits 10    Date for PT Re-Evaluation 02/04/21    Authorization Type [employer] Medicare    Progress Note Due on Visit 10    PT Start Time 0350    PT Stop Time 0420    PT Time Calculation (min) 30 min    Activity Tolerance Patient tolerated treatment well;Patient limited by pain    Behavior During Therapy [facility] for tasks assessed/performed             Past Medical History:  Diagnosis Date   Allergy    food allergies and some additives. But she does not know what will cause.   Depression    GERD (gastroesophageal reflux disease)    had in past but now controlled. Has hiatal hernia.   Heart murmur    History of blood clots 1996?   left leg   History of colon polyps    History of hiatal hernia    Hyperlipidemia     Past Surgical History:  Procedure Laterality Date   APPENDECTOMY  1972   DILATION AND CURETTAGE OF UTERUS     HIATAL HERNIA REPAIR  2016   INSERTION OF MESH N/A 01/09/2016   Procedure: INSERTION OF MESH;  Surgeon: Axel Filler, MD;  Location: [facility];  Service: General;  Laterality: N/A;   KNEE SURGERY Bilateral    hx of torn meniscus (arthroscopic surgery)   LEG SURGERY Left 2001   Had veins stripped    SHOULDER SURGERY Right    reports hx arthroscopic surgery    There were no vitals filed for this visit.    Subjective Assessment - 12/10/20 1552     Subjective Patient reports difficultie with balance and leg weakness, has had 4 falls in last 6 months.    Pertinent History scoliosis and LBP, bil edema, venous insufficiency, history of DVT    How  long can you stand comfortably? limited by LBP - 5 minutes    How long can you walk comfortably? walk around the block slowly with cane    Diagnostic tests Xray R knee 11/18/2020 - IMPRESSION:  1. No acute or healing fracture.  2. 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 Small knee joint effusion.  3. Mild anterior prepatellar soft tissue edema.    Patient Stated Goals hope my balance is better    Currently in Pain? Yes    Pain Score 0-No pain    Pain Location Knee    Pain Orientation Right;Left    Pain Descriptors / Indicators Aching    Pain Type Chronic pain    Pain Onset More than a month ago    Aggravating Factors  transfers                [facility] PT Assessment - 12/10/20 0001       Assessment   Medical Diagnosis R29.6 Falls frequently    Referring Provider (PT) Peggyann Juba, Melissa    Onset Date/Surgical Date --   gradual decline over last year   Next MD Visit 02/20/2021    Prior Therapy  none for balance   PT for knees     Precautions   Precautions Fall      Balance Screen   Has the patient fallen in the past 6 months Yes    How many times? 4    Has the patient had a decrease in activity level because of a fear of falling?  No    Is the patient reluctant to leave their home because of a fear of falling?  No      Home Environment   Living Environment Private residence    Living Arrangements Alone    Type of Home House    Home Layout Multi-level;Able to live on main level with bedroom/bathroom    Alternate Level Stairs-Number of Steps 12    Alternate Level Stairs-Rails Right;Left;Can reach both    Home Equipment Shower seat - built in;Grab bars - tub/shower;Cane - single point      Prior Function   Level of Independence Independent    Vocation Other (comment)   pre-pandemic 3 days/week giving out samples     Cognition   Overall Cognitive Status Within Functional Limits for tasks assessed      Observation/Other  Assessments   Observations entered without AD, bil ankle edema, bil ankle instability    Other Surveys  Activities of Balance and Confidence Scale    Activities of Balance Confidence Scale (ABC Scale)  75% confidence (25% disability)      Posture/Postural Control   Posture/Postural Control Postural limitations    Postural Limitations Rounded Shoulders;Forward head      ROM / Strength   AROM / PROM / Strength Strength      Strength   Strength Assessment Site Hip;Knee;Ankle    Right/Left Hip Right;Left    Right Hip Flexion 4/5    Right Hip ABduction 4+/5    Right Hip ADduction 4+/5    Left Hip Flexion 4/5    Left Hip ABduction 4+/5    Left Hip ADduction 4+/5    Right/Left Knee Right;Left    Right Knee Flexion 4+/5    Right Knee Extension 4+/5    Left Knee Flexion 4+/5    Left Knee Extension 4+/5    Right/Left Ankle Right;Left    Right Ankle Dorsiflexion 4+/5    Right Ankle Plantar Flexion 4/5    Right Ankle Inversion 4/5    Right Ankle Eversion 4/5    Left Ankle Dorsiflexion 4+/5    Left Ankle Plantar Flexion 4/5    Left Ankle Inversion 4/5    Left Ankle Eversion 4/5      Transfers   Five time sit to stand comments  26 seconds   no UE support, increased bil knee pain following.     Ambulation/Gait   Gait Comments noted ankle instability with gait, no AD      Balance   Balance Assessed Yes      Standardized Balance Assessment   Standardized Balance Assessment --   mCTSIB - condition 1 30 sec mild sway, condition 2 30 sec moderate sway, condition 3 30 sec moderate sway, condition 4 10 sec                       Objective measurements completed on examination: See above findings.               PT Education - 12/10/20 1630     Education Details educated on plan of care, safety - recommended using cane with  outdoor ambulation, night lights    Person(s) Educated Patient    Methods Explanation    Comprehension Verbalized understanding               PT Short Term Goals - 12/10/20 1653       PT SHORT TERM GOAL #1   Title Pt. will be compliant with initial HEP for LE strengthening and balance.    Time 3    Period Weeks    Status New    Target Date 12/31/20               PT Long Term Goals - 12/10/20 1653       PT LONG TERM GOAL #1   Title Pt will be compliant with advanced HEP to improve outcomes.    Time 8    Period Weeks    Status New    Target Date 02/04/21      PT LONG TERM GOAL #2   Title Pt will demonstrate improved functional strength as demonstrated by 5xSTS < 20 seconds    Baseline 26 seconds    Time 8    Period Weeks    Status New    Target Date 02/04/21      PT LONG TERM GOAL #3   Title Pt will complete BERG with goal TBD to decrease risk of falls    Time 8    Period Weeks    Status New    Target Date 02/04/21      PT LONG TERM GOAL #4   Title Pt. will complete DGI with goal TBD to decrease risk of falls.    Time 8    Period Weeks    Status New    Target Date 02/04/21      PT LONG TERM GOAL #5   Title Pt. will report 50% improvement in confidence with balance.    Time 8    Period Weeks    Status New    Target Date 02/04/21                    Plan - 12/10/20 1648     Clinical Impression Statement Pt. is a 75 year old female referred for frequent falls.  She reports history of knee problems, and falling and hitting R knee every time she falls.  She does report some dizziness if stands up too quickly or bends over, but this has not caused any of her falls.  She demonstrates bil ankle instability with walking and also when on airex pad.  She reports increased knee pain after 5x STS.  Today she demonstrated increased fall risk based on 5xSTS of 26 sec (>15 sec indicates risk of falls) and mCTSIB, noting increased sway with eyes closed and on compliant surface, able to maintain balance for only 10 seconds.  She would benefit from skilled physical therapy to improve balance and  LE strength and decrease falls to prevent injury.    Personal Factors and Comorbidities Age;Comorbidity 3+    Comorbidities history R knee pain, venous insuffiency, frequent falls, GERD, depression, insomnia, ETOH dependence    Examination-Activity Limitations Stairs;Locomotion Level;Squat;Stand;Bend    Examination-Participation Restrictions Community Activity    Stability/Clinical Decision Making Evolving/Moderate complexity    Clinical Decision Making Moderate    Rehab Potential Good    PT Frequency 2x / week   decreasing visits as appropriate   PT Duration 8 weeks    PT Treatment/Interventions ADLs/Self Care Home Management;Cryotherapy;Electrical Stimulation;Moist Heat;Ultrasound;Iontophoresis 4mg /ml  Dexamethasone;Functional mobility training;Therapeutic activities;Stair training;Gait training;Therapeutic exercise;Balance training;Neuromuscular re-education;Patient/family education;Taping;Vestibular;Visual/perceptual remediation/compensation;Joint Manipulations    PT Next Visit Plan Andi [place], [place] program to start HEP    Consulted and Agree with Plan of Care Patient             Patient will benefit from skilled therapeutic intervention in order to improve the following deficits and impairments:  Abnormal gait, Decreased activity tolerance, Decreased endurance, Decreased strength, Improper body mechanics, Pain, Postural dysfunction, Increased edema, Difficulty walking, Decreased safety awareness, Decreased mobility, Decreased balance  Visit Diagnosis: Repeated falls  Muscle weakness (generalized)  Other abnormalities of gait and mobility  Chronic pain of right knee     Problem List Patient Active Problem List   Diagnosis Date Noted   Osteopenia 10/13/2017   Insomnia 06/09/2016   Alcohol dependence (HCC) 01/13/2016   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  [address] [place], [place], [address] Phone: [phone]   Fax:  [phone]  Name: Judy Weiss MRN: [id number] Date of Birth: 09/17/1945

## 2020-12-23 ENCOUNTER — Encounter (HOSPITAL_BASED_OUTPATIENT_CLINIC_OR_DEPARTMENT_OTHER): Payer: Self-pay

## 2020-12-23 ENCOUNTER — Other Ambulatory Visit: Payer: Self-pay

## 2020-12-23 ENCOUNTER — Ambulatory Visit (HOSPITAL_BASED_OUTPATIENT_CLINIC_OR_DEPARTMENT_OTHER)
Admission: RE | Admit: 2020-12-23 | Discharge: 2020-12-23 | Disposition: A | Discharge status: Home / Self Care | Payer: Medicare Other | Source: Ambulatory Visit | Attending: Family | Admitting: Family

## 2020-12-23 DIAGNOSIS — M85832 Other specified disorders of bone density and structure, left forearm: Secondary | ICD-10-CM | POA: Diagnosis not present

## 2020-12-23 DIAGNOSIS — Z780 Asymptomatic menopausal state: Secondary | ICD-10-CM

## 2020-12-23 DIAGNOSIS — Z1231 Encounter for screening mammogram for malignant neoplasm of breast: Secondary | ICD-10-CM | POA: Insufficient documentation

## 2020-12-30 ENCOUNTER — Ambulatory Visit: Payer: Medicare Other | Attending: Family

## 2020-12-30 ENCOUNTER — Other Ambulatory Visit: Payer: Self-pay

## 2020-12-30 DIAGNOSIS — M6281 Muscle weakness (generalized): Secondary | ICD-10-CM | POA: Diagnosis not present

## 2020-12-30 DIAGNOSIS — G8929 Other chronic pain: Secondary | ICD-10-CM | POA: Insufficient documentation

## 2020-12-30 DIAGNOSIS — R296 Repeated falls: Secondary | ICD-10-CM | POA: Diagnosis not present

## 2020-12-30 DIAGNOSIS — M25561 Pain in right knee: Secondary | ICD-10-CM | POA: Insufficient documentation

## 2020-12-30 DIAGNOSIS — R2689 Other abnormalities of gait and mobility: Secondary | ICD-10-CM | POA: Insufficient documentation

## 2020-12-30 NOTE — Therapy (Signed)
[facility] [address]  [address] [place], [place], [address] Phone: [phone]   Fax:  [phone]  Physical Therapy Treatment  Patient Details  Name: Judy Weiss MRN: [id number] Date of Birth: [birth year] Referring Provider (PT): Sandford Craze   Encounter Date: 12/30/2020   PT End of Session - 12/30/20 1444     Visit Number 2    Number of Visits 10    Date for PT Re-Evaluation 02/04/21    Authorization Type UHC Medicare    Progress Note Due on Visit 10    PT Start Time 1401    PT Stop Time 1443    PT Time Calculation (min) 42 min    Activity Tolerance Patient tolerated treatment well    Behavior During Therapy [facility] for tasks assessed/performed             Past Medical History:  Diagnosis Date   Allergy    food allergies and some additives. But she does not know what will cause.   Depression    GERD (gastroesophageal reflux disease)    had in past but now controlled. Has hiatal hernia.   Heart murmur    History of blood clots 1996?   left leg   History of colon polyps    History of hiatal hernia    Hyperlipidemia     Past Surgical History:  Procedure Laterality Date   APPENDECTOMY  1972   DILATION AND CURETTAGE OF UTERUS     HIATAL HERNIA REPAIR  2016   INSERTION OF MESH N/A 01/09/2016   Procedure: INSERTION OF MESH;  Surgeon: Axel Filler, MD;  Location: [facility];  Service: General;  Laterality: N/A;   KNEE SURGERY Bilateral    hx of torn meniscus (arthroscopic surgery)   LEG SURGERY Left 2001   Had veins stripped    SHOULDER SURGERY Right    reports hx arthroscopic surgery    There were no vitals filed for this visit.   Subjective Assessment - 12/30/20 1404     Subjective Pt reports that her knees are hurting today, reports that her knees will buckle on her or give way at times causing her to fall.    Pertinent History scoliosis and LBP, bil edema, venous insufficiency, history of DVT     Diagnostic tests Xray R knee 11/18/2020 - IMPRESSION:  1. No acute or healing fracture.  2. 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 Small knee joint effusion.  3. Mild anterior prepatellar soft tissue edema.    Patient Stated Goals hope my balance is better    Currently in Pain? No/denies                [facility] PT Assessment - 12/30/20 0001       Standardized Balance Assessment   Standardized Balance Assessment Berg Balance Test;Dynamic Gait Index      Berg Balance Test   Sit to Stand Able to stand without using hands and stabilize independently    Standing Unsupported Able to stand safely 2 minutes    Sitting with Back Unsupported but Feet Supported on Floor or Stool Able to sit safely and securely 2 minutes    Stand to Sit Sits safely with minimal use of hands    Transfers Able to transfer safely, minor use of hands    Standing Unsupported with Eyes Closed Able to stand 10 seconds safely    Standing Unsupported with Feet Together Able to place  feet together independently and stand 1 minute safely    From Standing, Reach Forward with Outstretched Arm Can reach confidently >25 cm (10")    From Standing Position, Pick up Object from Floor Able to pick up shoe, needs supervision    From Standing Position, Turn to Look Behind Over each Shoulder Looks behind one side only/other side shows less weight shift    Turn 360 Degrees Able to turn 360 degrees safely but slowly    Standing Unsupported, Alternately Place Feet on Step/Stool Able to complete 4 steps without aid or supervision    Standing Unsupported, One Foot in Front Needs help to step but can hold 15 seconds    Standing on One Leg Tries to lift leg/unable to hold 3 seconds but remains standing independently    Total Score 44      Dynamic Gait Index   Level Surface Normal    Change in Gait Speed Mild Impairment    Gait with Horizontal Head Turns Moderate Impairment    Gait with  Vertical Head Turns Severe Impairment    Gait and Pivot Turn Mild Impairment    Step Over Obstacle Normal    Step Around Obstacles Mild Impairment    Steps Mild Impairment    Total Score 15                           [facility] Adult PT Treatment/Exercise - 12/30/20 0001       Exercises   Exercises Knee/Hip      Knee/Hip Exercises: Aerobic   Nustep L2x63min      Knee/Hip Exercises: Standing   Hip Abduction Stengthening;Both;10 reps;Knee straight    Abduction Limitations counter support    Hip Extension Stengthening;Both;10 reps;Knee straight    Extension Limitations counter support      Knee/Hip Exercises: Seated   Sit to Sand 10 reps;with UE support;without UE support                     PT Education - 12/30/20 1535     Education Details HEP update: Access Code: [email or web address]    Person(s) Educated Patient    Methods Explanation;Demonstration    Comprehension Verbalized understanding;Returned demonstration              PT Short Term Goals - 12/30/20 1535       PT SHORT TERM GOAL #1   Title Pt. will be compliant with initial HEP for LE strengthening and balance.    Time 3    Period Weeks    Status On-going   no intial HEP given; unable to assess   Target Date 12/31/20               PT Long Term Goals - 12/30/20 1536       PT LONG TERM GOAL #1   Title Pt will be compliant with advanced HEP to improve outcomes.    Time 8    Period Weeks    Status On-going      PT LONG TERM GOAL #2   Title Pt will demonstrate improved functional strength as demonstrated by 5xSTS < 20 seconds    Baseline 26 seconds    Time 8    Period Weeks    Status On-going      PT LONG TERM GOAL #3   Title Pt will complete BERG with goal TBD to decrease risk of falls    Time 8  Period Weeks    Status On-going      PT LONG TERM GOAL #4   Title Pt. will complete DGI with goal TBD to decrease risk of falls.    Time 8    Period Weeks    Status On-going       PT LONG TERM GOAL #5   Title Pt. will report 50% improvement in confidence with balance.    Time 8    Period Weeks    Status On-going                   Plan - 12/30/20 1445     Clinical Impression Statement Pt demonstrated balance deficits during the BERG and DGI. Increased risk of falls indicated with both assessments. Advised her to use her cane when ambulating outside of her home at least, pt seems reluctant to do so but encouraged her to use it for safety. She demonstrated decreased control with the standing hip exercises. Added standing hip exercises with counter support for HEP for safety. Unable to initiate [place] due to time contraints.    Personal Factors and Comorbidities Age;Comorbidity 3+    Comorbidities history R knee pain, venous insuffiency, frequent falls, GERD, depression, insomnia, ETOH dependence    PT Frequency 2x / week    PT Duration 8 weeks    PT Treatment/Interventions ADLs/Self Care Home Management;Cryotherapy;Electrical Stimulation;Moist Heat;Ultrasound;Iontophoresis 4mg /ml Dexamethasone;Functional mobility training;Therapeutic activities;Stair training;Gait training;Therapeutic exercise;Balance training;Neuromuscular re-education;Patient/family education;Taping;Vestibular;Visual/perceptual remediation/compensation;Joint Manipulations    PT Next Visit Plan [place] program to start HEP    Consulted and Agree with Plan of Care Patient             Patient will benefit from skilled therapeutic intervention in order to improve the following deficits and impairments:  Abnormal gait, Decreased activity tolerance, Decreased endurance, Decreased strength, Improper body mechanics, Pain, Postural dysfunction, Increased edema, Difficulty walking, Decreased safety awareness, Decreased mobility, Decreased balance  Visit Diagnosis: Repeated falls  Muscle weakness (generalized)  Other abnormalities of gait and mobility  Chronic pain of right  knee     Problem List Patient Active Problem List   Diagnosis Date Noted   Osteopenia 10/13/2017   Insomnia 06/09/2016   Alcohol dependence (HCC) 01/13/2016   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

## 2020-12-30 NOTE — Patient Instructions (Signed)
Access Code: [email or web address] URL: [email or web address] Date: 12/30/2020 Prepared by: Verta Ellen  Exercises Standing Hip Abduction with Counter Support - 1 x daily - 7 x weekly - 2 sets - 10 reps Standing Hip Extension with Counter Support - 1 x daily - 7 x weekly - 2 sets - 10 reps Sit to Stand - 1 x daily - 7 x weekly - 2 sets - 10 reps

## 2021-01-01 ENCOUNTER — Encounter: Payer: Self-pay | Admitting: Physical Therapy

## 2021-01-01 ENCOUNTER — Ambulatory Visit: Payer: Medicare Other | Admitting: Physical Therapy

## 2021-01-01 ENCOUNTER — Other Ambulatory Visit: Payer: Self-pay

## 2021-01-01 DIAGNOSIS — R2689 Other abnormalities of gait and mobility: Secondary | ICD-10-CM

## 2021-01-01 DIAGNOSIS — M6281 Muscle weakness (generalized): Secondary | ICD-10-CM | POA: Diagnosis not present

## 2021-01-01 DIAGNOSIS — G8929 Other chronic pain: Secondary | ICD-10-CM | POA: Diagnosis not present

## 2021-01-01 DIAGNOSIS — R296 Repeated falls: Secondary | ICD-10-CM

## 2021-01-01 DIAGNOSIS — M25561 Pain in right knee: Secondary | ICD-10-CM | POA: Diagnosis not present

## 2021-01-01 NOTE — Therapy (Signed)
[facility] Outpatient Rehabilitation [facility] [address]  [address] [place], [place], [address] Phone: [phone]   Fax:  [phone]  Physical Therapy Treatment  Patient Details  Name: Mashell Sieben MRN: [id number] Date of Birth: Apr 18, 1946 Referring Provider (PT): Sandford Craze   Encounter Date: 01/01/2021   PT End of Session - 01/01/21 1624     Visit Number 3    Number of Visits 10    Date for PT Re-Evaluation 02/04/21    Authorization Type UHC Medicare    Progress Note Due on Visit 10    PT Start Time 1615    PT Stop Time 1657    PT Time Calculation (min) 42 min    Activity Tolerance Patient tolerated treatment well    Behavior During Therapy [facility] for tasks assessed/performed             Past Medical History:  Diagnosis Date   Allergy    food allergies and some additives. But she does not know what will cause.   Depression    GERD (gastroesophageal reflux disease)    had in past but now controlled. Has hiatal hernia.   Heart murmur    History of blood clots 1996?   left leg   History of colon polyps    History of hiatal hernia    Hyperlipidemia     Past Surgical History:  Procedure Laterality Date   APPENDECTOMY  1972   DILATION AND CURETTAGE OF UTERUS     HIATAL HERNIA REPAIR  2016   INSERTION OF MESH N/A 01/09/2016   Procedure: INSERTION OF MESH;  Surgeon: Axel Filler, MD;  Location: [facility];  Service: General;  Laterality: N/A;   KNEE SURGERY Bilateral    hx of torn meniscus (arthroscopic surgery)   LEG SURGERY Left 2001   Had veins stripped    SHOULDER SURGERY Right    reports hx arthroscopic surgery    There were no vitals filed for this visit.   Subjective Assessment - 01/01/21 1621     Subjective Pt. reports that she woke up with a headache this morning, denies any knee buckling or LOB or falls.    Pertinent History scoliosis and LBP, bil edema, venous insufficiency, history of DVT    Diagnostic tests Xray R  knee 11/18/2020 - IMPRESSION:  1. No acute or healing fracture.  2. 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 Small knee joint effusion.  3. Mild anterior prepatellar soft tissue edema.    Patient Stated Goals hope my balance is better    Currently in Pain? No/denies   no more than normal in back and knees                              [facility] Adult PT Treatment/Exercise - 01/01/21 0001       Self-Care   Self-Care Other Self-Care Comments    Other Self-Care Comments  education on safety and fall prevention.      Exercises   Exercises Knee/Hip      Knee/Hip Exercises: Aerobic   Nustep L4 x 6 min      Knee/Hip Exercises: Standing   Heel Raises Both;2 sets;10 reps    Hip Abduction Stengthening;Both;Knee straight;20 reps    Abduction Limitations counter support    Hip Extension Stengthening;Both;Knee straight;20 reps    Extension Limitations counter support    Wall Squat 10 reps  increased bil knee pain, ok with mini squats   Wall Squat Limitations counter support    Other Standing Knee Exercises tandem stance x 30 sec bil with 1 UE support.      Knee/Hip Exercises: Seated   Sit to Sand 10 reps;with UE support;without UE support                     PT Education - 01/01/21 1700     Education Details progressed HEP.  Also educated on safety to prevent falls during rest breaks.    Person(s) Educated Patient    Methods Explanation;Demonstration;Verbal cues;Handout    Comprehension Verbalized understanding;Returned demonstration              PT Short Term Goals - 01/01/21 1645       PT SHORT TERM GOAL #1   Title Pt. will be compliant with initial HEP for LE strengthening and balance.    Time 3    Period Weeks    Status On-going   given initial HEP but not consistent, educated today on importance.   Target Date 12/31/20               PT Long Term Goals - 01/01/21 1625       PT LONG TERM  GOAL #1   Title Pt will be compliant with advanced HEP to improve outcomes.    Time 8    Period Weeks    Status On-going      PT LONG TERM GOAL #2   Title Pt will demonstrate improved functional strength as demonstrated by 5xSTS < 20 seconds    Baseline 26 seconds    Time 8    Period Weeks    Status On-going      PT LONG TERM GOAL #3   Title Pt will demonstrate safety with gait by score >49/56 on Berg    Baseline 44/56 indicates higher risk of falls and need for AD outdoors    Time 8    Period Weeks    Status New    Target Date 02/04/21      PT LONG TERM GOAL #4   Title Pt. will  score at least 19/24 on DGI to decrease risk of falls.    Baseline 15/24    Time 8    Period Weeks    Status New    Target Date 02/04/21      PT LONG TERM GOAL #5   Title Pt. will report 50% improvement in confidence with balance.    Time 8    Period Weeks    Status On-going                   Plan - 01/01/21 1759     Clinical Impression Statement Pt. reports only performing HEP 1x since last visit.  Educated throughout session during seated rest breaks on importance of exercise, strengthening muscles both for joint health and to prevent falls.  Educated on potential consequences of falls and importance of improving strength and function to maintain independence.  Educated on decreasing fall risk around home.  She needed frequent rest breaks with standing exercises, and did report knee pain with squats, but tolerable with minisquats.  HEP updated, she would benefit from continued skilled therapy.    Personal Factors and Comorbidities Age;Comorbidity 3+    Comorbidities history R knee pain, venous insuffiency, frequent falls, GERD, depression, insomnia, ETOH dependence    PT Frequency 2x / week  PT Duration 8 weeks    PT Treatment/Interventions ADLs/Self Care Home Management;Cryotherapy;Electrical Stimulation;Moist Heat;Ultrasound;Iontophoresis 4mg /ml Dexamethasone;Functional mobility  training;Therapeutic activities;Stair training;Gait training;Therapeutic exercise;Balance training;Neuromuscular re-education;Patient/family education;Taping;Vestibular;Visual/perceptual remediation/compensation;Joint Manipulations    PT Next Visit Plan LE strengthening and balance.    PT Home Exercise Plan Access Code: [email or web address]    Consulted and Agree with Plan of Care Patient             Patient will benefit from skilled therapeutic intervention in order to improve the following deficits and impairments:  Abnormal gait, Decreased activity tolerance, Decreased endurance, Decreased strength, Improper body mechanics, Pain, Postural dysfunction, Increased edema, Difficulty walking, Decreased safety awareness, Decreased mobility, Decreased balance  Visit Diagnosis: Repeated falls  Muscle weakness (generalized)  Other abnormalities of gait and mobility  Chronic pain of right knee     Problem List Patient Active Problem List   Diagnosis Date Noted   Osteopenia 10/13/2017   Insomnia 06/09/2016   Alcohol dependence (HCC) 01/13/2016   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  [address] [place], [place], [place] Phone: [phone]   Fax:  [phone]  Name: Gladyse Corvin MRN: Suszanne Conners Date of Birth: 1946-04-01

## 2021-01-01 NOTE — Patient Instructions (Signed)
Access Code: [email or web address] URL: [email or web address] Date: 01/01/2021 Prepared by: Harrie Foreman  Exercises Heel Toe Raises with Counter Support - 1 x daily - 7 x weekly - 2 sets - 10 reps Standing Hip Abduction with Counter Support - 1 x daily - 7 x weekly - 2 sets - 10 reps Standing Hip Extension with Counter Support - 1 x daily - 7 x weekly - 2 sets - 10 reps Mini Squat with Counter Support - 1 x daily - 7 x weekly - 2 sets - 10 reps Sit to Stand - 3 x daily - 7 x weekly - 1 sets - 10 reps Standing Tandem Balance with Counter Support - 1 x daily - 7 x weekly - 1 sets - 2 reps - 30 sec hold

## 2021-01-06 ENCOUNTER — Other Ambulatory Visit: Payer: Self-pay

## 2021-01-06 ENCOUNTER — Ambulatory Visit: Payer: Medicare Other

## 2021-01-06 DIAGNOSIS — G8929 Other chronic pain: Secondary | ICD-10-CM | POA: Diagnosis not present

## 2021-01-06 DIAGNOSIS — R296 Repeated falls: Secondary | ICD-10-CM | POA: Diagnosis not present

## 2021-01-06 DIAGNOSIS — M25561 Pain in right knee: Secondary | ICD-10-CM

## 2021-01-06 DIAGNOSIS — M6281 Muscle weakness (generalized): Secondary | ICD-10-CM

## 2021-01-06 DIAGNOSIS — R2689 Other abnormalities of gait and mobility: Secondary | ICD-10-CM

## 2021-01-06 NOTE — Therapy (Deleted)
[facility] Outpatient Rehabilitation [facility] [address]  [address] [place], [place], [address] Phone: [phone]   Fax:  [phone]  Physical Therapy Treatment  Patient Details  Name: Judy Weiss MRN: [id number] Date of Birth: 1945/11/10 Referring Provider (PT): Sandford Craze   Encounter Date: 01/06/2021   PT End of Session - 01/06/21 1619     Visit Number 4    Number of Visits 10    Date for PT Re-Evaluation 02/04/21    Authorization Type UHC Medicare    Progress Note Due on Visit 10    PT Start Time 1533    PT Stop Time 1612    PT Time Calculation (min) 39 min    Activity Tolerance Patient tolerated treatment well    Behavior During Therapy [facility] for tasks assessed/performed             Past Medical History:  Diagnosis Date   Allergy    food allergies and some additives. But she does not know what will cause.   Depression    GERD (gastroesophageal reflux disease)    had in past but now controlled. Has hiatal hernia.   Heart murmur    History of blood clots 1996?   left leg   History of colon polyps    History of hiatal hernia    Hyperlipidemia     Past Surgical History:  Procedure Laterality Date   APPENDECTOMY  1972   DILATION AND CURETTAGE OF UTERUS     HIATAL HERNIA REPAIR  2016   INSERTION OF MESH N/A 01/09/2016   Procedure: INSERTION OF MESH;  Surgeon: Axel Filler, MD;  Location: [facility];  Service: General;  Laterality: N/A;   KNEE SURGERY Bilateral    hx of torn meniscus (arthroscopic surgery)   LEG SURGERY Left 2001   Had veins stripped    SHOULDER SURGERY Right    reports hx arthroscopic surgery    There were no vitals filed for this visit.   Subjective Assessment - 01/06/21 1542     Subjective Legs feel weak and noting LBP today.    Pertinent History scoliosis and LBP, bil edema, venous insufficiency, history of DVT    Diagnostic tests Xray R knee 11/18/2020 - IMPRESSION:  1. No acute or healing fracture.   2. 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 Small knee joint effusion.  3. Mild anterior prepatellar soft tissue edema.    Patient Stated Goals hope my balance is better    Currently in Pain? No/denies                               [facility] Adult PT Treatment/Exercise - 01/06/21 0001       Knee/Hip Exercises: Stretches   Active Hamstring Stretch Right;Left;30 seconds    Gastroc Stretch Both;2 reps;30 seconds    Gastroc Stretch Limitations counter support      Knee/Hip Exercises: Aerobic   Recumbent Bike L1x42min      Knee/Hip Exercises: Standing   Hip Abduction Stengthening;Both;10 reps    Abduction Limitations counter support; 1#    Hip Extension Stengthening;Both;10 reps    Extension Limitations counter support; 1#      Knee/Hip Exercises: Seated   Long Arc Quad Strengthening;Both;2 sets;10 reps;Weights    Long Arc Quad Weight 1 lbs.    Ball Squeeze 10x5"    Hamstring Curl Strengthening;Both;10 reps    Hamstring Limitations red  TB                       PT Short Term Goals - 01/01/21 1645       PT SHORT TERM GOAL #1   Title Pt. will be compliant with initial HEP for LE strengthening and balance.    Time 3    Period Weeks    Status On-going   given initial HEP but not consistent, educated today on importance.   Target Date 12/31/20               PT Long Term Goals - 01/01/21 1625       PT LONG TERM GOAL #1   Title Pt will be compliant with advanced HEP to improve outcomes.    Time 8    Period Weeks    Status On-going      PT LONG TERM GOAL #2   Title Pt will demonstrate improved functional strength as demonstrated by 5xSTS < 20 seconds    Baseline 26 seconds    Time 8    Period Weeks    Status On-going      PT LONG TERM GOAL #3   Title Pt will demonstrate safety with gait by score >49/56 on Berg    Baseline 44/56 indicates higher risk of falls and need for AD outdoors    Time  8    Period Weeks    Status New    Target Date 02/04/21      PT LONG TERM GOAL #4   Title Pt. will  score at least 19/24 on DGI to decrease risk of falls.    Baseline 15/24    Time 8    Period Weeks    Status New    Target Date 02/04/21      PT LONG TERM GOAL #5   Title Pt. will report 50% improvement in confidence with balance.    Time 8    Period Weeks    Status On-going                   Plan - 01/06/21 1622     Clinical Impression Statement Pt unable to tolerate mini squats too well today. Progressed standing ther ex with light resistance, pt required seated breaks intermittent to allow for recovery. Cues required for posture with exercises and to isolate the correct muscles. She still would benefit from general LE strengthening and balance to improve gait stability.    Personal Factors and Comorbidities Age;Comorbidity 3+    Comorbidities history R knee pain, venous insuffiency, frequent falls, GERD, depression, insomnia, ETOH dependence    PT Frequency 2x / week    PT Duration 8 weeks    PT Treatment/Interventions ADLs/Self Care Home Management;Cryotherapy;Electrical Stimulation;Moist Heat;Ultrasound;Iontophoresis 4mg /ml Dexamethasone;Functional mobility training;Therapeutic activities;Stair training;Gait training;Therapeutic exercise;Balance training;Neuromuscular re-education;Patient/family education;Taping;Vestibular;Visual/perceptual remediation/compensation;Joint Manipulations    PT Next Visit Plan LE strengthening and balance.    PT Home Exercise Plan Access Code: [email or web address]    Consulted and Agree with Plan of Care Patient             Patient will benefit from skilled therapeutic intervention in order to improve the following deficits and impairments:  Abnormal gait, Decreased activity tolerance, Decreased endurance, Decreased strength, Improper body mechanics, Pain, Postural dysfunction, Increased edema, Difficulty walking, Decreased safety awareness,  Decreased mobility, Decreased balance  Visit Diagnosis: Repeated falls  Muscle weakness (generalized)  Other abnormalities of gait and mobility  Chronic pain of right  knee     Problem List Patient Active Problem List   Diagnosis Date Noted   Osteopenia 10/13/2017   Insomnia 06/09/2016   Alcohol dependence (HCC) 01/13/2016   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

## 2021-01-07 NOTE — Therapy (Signed)
[facility] [address]  [address] [place], [place], [address] Phone: [phone]   Fax:  [phone]  Physical Therapy Treatment  Patient Details  Name: Judy Weiss MRN: [id number] Date of Birth: 08/30/1945 Referring Provider (PT): Sandford Craze   Encounter Date: 01/06/2021   PT End of Session - 01/06/21 1619     Visit Number 4    Number of Visits 10    Date for PT Re-Evaluation 02/04/21    Authorization Type U[employer] Medicare    Progress Note Due on Visit 10    PT Start Time 1535    PT Stop Time 1612    PT Time Calculation (min) 37 min    Activity Tolerance Patient tolerated treatment well    Behavior During Therapy [facility] for tasks assessed/performed             Past Medical History:  Diagnosis Date   Allergy    food allergies and some additives. But she does not know what will cause.   Depression    GERD (gastroesophageal reflux disease)    had in past but now controlled. Has hiatal hernia.   Heart murmur    History of blood clots 1996?   left leg   History of colon polyps    History of hiatal hernia    Hyperlipidemia     Past Surgical History:  Procedure Laterality Date   APPENDECTOMY  1972   DILATION AND CURETTAGE OF UTERUS     HIATAL HERNIA REPAIR  2016   INSERTION OF MESH N/A 01/09/2016   Procedure: INSERTION OF MESH;  Surgeon: Axel Filler, MD;  Location: [facility];  Service: General;  Laterality: N/A;   KNEE SURGERY Bilateral    hx of torn meniscus (arthroscopic surgery)   LEG SURGERY Left 2001   Had veins stripped    SHOULDER SURGERY Right    reports hx arthroscopic surgery    There were no vitals filed for this visit.   Subjective Assessment - 01/06/21 1542     Subjective Legs feel weak and noting LBP today.    Pertinent History scoliosis and LBP, bil edema, venous insufficiency, history of DVT    Diagnostic tests Xray R knee 11/18/2020 - IMPRESSION:  1. No acute or healing fracture.   2. 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 Small knee joint effusion.  3. Mild anterior prepatellar soft tissue edema.    Patient Stated Goals hope my balance is better    Currently in Pain? No/denies             [facility] Adult PT Treatment/Exercise - 01/06/21 0001                Knee/Hip Exercises: Stretches    Active Hamstring Stretch Right;Left;30 seconds     Gastroc Stretch Both;2 reps;30 seconds     Gastroc Stretch Limitations counter support          Knee/Hip Exercises: Aerobic    Recumbent Bike L1x85min          Knee/Hip Exercises: Standing    Hip Abduction Stengthening;Both;10 reps     Abduction Limitations counter support; 1#     Hip Extension Stengthening;Both;10 reps     Extension Limitations counter support; 1#          Knee/Hip Exercises: Seated    Long Arc Quad Strengthening;Both;2 sets;10 reps;Weights     Long Arc Quad Weight 1 lbs.     Ball Squeeze  10x5"     Hamstring Curl Strengthening;Both;10 reps     Hamstring Limitations red TB                                PT Short Term Goals - 01/01/21 1645       PT SHORT TERM GOAL #1   Title Pt. will be compliant with initial HEP for LE strengthening and balance.    Time 3    Period Weeks    Status On-going   given initial HEP but not consistent, educated today on importance.   Target Date 12/31/20               PT Long Term Goals - 01/01/21 1625       PT LONG TERM GOAL #1   Title Pt will be compliant with advanced HEP to improve outcomes.    Time 8    Period Weeks    Status On-going      PT LONG TERM GOAL #2   Title Pt will demonstrate improved functional strength as demonstrated by 5xSTS < 20 seconds    Baseline 26 seconds    Time 8    Period Weeks    Status On-going      PT LONG TERM GOAL #3   Title Pt will demonstrate safety with gait by score >49/56 on Berg    Baseline 44/56 indicates higher risk of falls and need for  AD outdoors    Time 8    Period Weeks    Status New    Target Date 02/04/21      PT LONG TERM GOAL #4   Title Pt. will  score at least 19/24 on DGI to decrease risk of falls.    Baseline 15/24    Time 8    Period Weeks    Status New    Target Date 02/04/21      PT LONG TERM GOAL #5   Title Pt. will report 50% improvement in confidence with balance.    Time 8    Period Weeks    Status On-going                   Plan - 01/06/21 1622     Clinical Impression Statement Pt unable to tolerate mini squats too well today. Progressed standing ther ex with light resistance, pt required seated breaks intermittent to allow for recovery. Cues required for posture with exercises and to isolate the correct muscles. She still would benefit from general LE strengthening and balance to improve gait stability.    Personal Factors and Comorbidities Age;Comorbidity 3+    Comorbidities history R knee pain, venous insuffiency, frequent falls, GERD, depression, insomnia, ETOH dependence    PT Frequency 2x / week    PT Duration 8 weeks    PT Treatment/Interventions ADLs/Self Care Home Management;Cryotherapy;Electrical Stimulation;Moist Heat;Ultrasound;Iontophoresis 4mg /ml Dexamethasone;Functional mobility training;Therapeutic activities;Stair training;Gait training;Therapeutic exercise;Balance training;Neuromuscular re-education;Patient/family education;Taping;Vestibular;Visual/perceptual remediation/compensation;Joint Manipulations    PT Next Visit Plan LE strengthening and balance.    PT Home Exercise Plan Access Code: [email or web address]    Consulted and Agree with Plan of Care Patient             Patient will benefit from skilled therapeutic intervention in order to improve the following deficits and impairments:  Abnormal gait, Decreased activity tolerance, Decreased endurance, Decreased strength, Improper body mechanics, Pain, Postural dysfunction, Increased edema, Difficulty walking, Decreased  safety awareness,  Decreased mobility, Decreased balance  Visit Diagnosis: Repeated falls  Muscle weakness (generalized)  Other abnormalities of gait and mobility  Chronic pain of right knee     Problem List Patient Active Problem List   Diagnosis Date Noted   Osteopenia 10/13/2017   Insomnia 06/09/2016   Alcohol dependence (HCC) 01/13/2016   Paraesophageal hiatal hernia s/p robotic reduction/repair with mesh 01/09/2016 01/09/2016   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  [address] [place], [place], [address] Phone: [phone]   Fax:  [phone]  Name: Judy Weiss MRN: [id number] Date of Birth: 02-10-1946

## 2021-01-08 ENCOUNTER — Other Ambulatory Visit: Payer: Self-pay

## 2021-01-08 ENCOUNTER — Ambulatory Visit: Payer: Medicare Other | Admitting: Physical Therapy

## 2021-01-08 ENCOUNTER — Encounter: Payer: Self-pay | Admitting: Physical Therapy

## 2021-01-08 DIAGNOSIS — R296 Repeated falls: Secondary | ICD-10-CM

## 2021-01-08 DIAGNOSIS — G8929 Other chronic pain: Secondary | ICD-10-CM | POA: Diagnosis not present

## 2021-01-08 DIAGNOSIS — M25561 Pain in right knee: Secondary | ICD-10-CM

## 2021-01-08 DIAGNOSIS — M6281 Muscle weakness (generalized): Secondary | ICD-10-CM | POA: Diagnosis not present

## 2021-01-08 DIAGNOSIS — R2689 Other abnormalities of gait and mobility: Secondary | ICD-10-CM

## 2021-01-08 NOTE — Patient Instructions (Signed)
Access Code: [facility] URL: [email or web address] Date: 01/08/2021 Prepared by: Harrie Foreman  Exercises Supine Lower Trunk Rotation - 1 x daily - 7 x weekly - 2 sets - 10 reps Supine Posterior Pelvic Tilt - 1 x daily - 7 x weekly - 2 sets - 10 reps - 5 sec hold Supine Bridge - 1 x daily - 7 x weekly - 2 sets - 10 reps Supine Single Leg Lift - 1 x daily - 7 x weekly - 2 sets - 10 reps Hooklying Clamshell with Resistance - 1 x daily - 7 x weekly - 2 sets - 10 reps Supine Hip Adduction Isometric with Ball - 1 x daily - 7 x weekly - 2 sets - 10 reps - 5 sec hold

## 2021-01-08 NOTE — Therapy (Signed)
[facility] [address]  [address] [place], [place], [address] Phone: [phone]   Fax:  [phone]  Physical Therapy Treatment  Patient Details  Name: Judy Weiss MRN: [id number] Date of Birth: 15-Oct-1945 Referring Provider (PT): Sandford Craze   Encounter Date: 01/08/2021   PT End of Session - 01/08/21 1708     Visit Number 5    Number of Visits 10    Date for PT Re-Evaluation 02/04/21    Authorization Type UHC Medicare    Progress Note Due on Visit 10    PT Start Time 1705    PT Stop Time 1747    PT Time Calculation (min) 42 min    Activity Tolerance Patient tolerated treatment well    Behavior During Therapy [facility] for tasks assessed/performed             Past Medical History:  Diagnosis Date   Allergy    food allergies and some additives. But she does not know what will cause.   Depression    GERD (gastroesophageal reflux disease)    had in past but now controlled. Has hiatal hernia.   Heart murmur    History of blood clots 1996?   left leg   History of colon polyps    History of hiatal hernia    Hyperlipidemia     Past Surgical History:  Procedure Laterality Date   APPENDECTOMY  1972   DILATION AND CURETTAGE OF UTERUS     HIATAL HERNIA REPAIR  2016   INSERTION OF MESH N/A 01/09/2016   Procedure: INSERTION OF MESH;  Surgeon: Axel Filler, MD;  Location: [facility];  Service: General;  Laterality: N/A;   KNEE SURGERY Bilateral    hx of torn meniscus (arthroscopic surgery)   LEG SURGERY Left 2001   Had veins stripped    SHOULDER SURGERY Right    reports hx arthroscopic surgery    There were no vitals filed for this visit.   Subjective Assessment - 01/08/21 1706     Subjective Pt. reports Mondays session set her back off, but yesterday got a pedicure and the massage chair helped a lot.  Denies new falls.    Pertinent History scoliosis and LBP, bil edema, venous insufficiency, history of  DVT    Diagnostic tests Xray R knee 11/18/2020 - IMPRESSION:  1. No acute or healing fracture.  2. 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 Small knee joint effusion.  3. Mild anterior prepatellar soft tissue edema.    Patient Stated Goals hope my balance is better    Currently in Pain? No/denies                               [facility] Adult PT Treatment/Exercise - 01/08/21 0001       Lumbar Exercises: Supine   Pelvic Tilt 10 reps;5 seconds    Pelvic Tilt Limitations tactile and VC    Clam 10 reps    Clam Limitations YTB    Bridge 10 reps    Bridge Limitations small ROM with TrA contraction      Knee/Hip Exercises: Aerobic   Nustep L5 x 3 min; L 4 x 2 min   rest break needed                Balance Exercises - 01/08/21 0001       Balance Exercises: Standing  Standing Eyes Opened Foam/compliant surface;30 secs;Limitations    Standing Eyes Opened Limitations in corner or safety, progressed to head nods, extremely challenging, CGA for safety    Tandem Stance Eyes open;Upper extremity support 1;1 rep;30 secs;Limitations    Tandem Stance Time starting with 1 UE support, then lifting hand up, SBA for safety throughout                PT Education - 01/08/21 1751     Education Details progressed HEP with supine core strengthening exercises.    Person(s) Educated Patient    Methods Explanation;Demonstration;Verbal cues;Handout    Comprehension Verbalized understanding;Returned demonstration;Need further instruction              PT Short Term Goals - 01/01/21 1645       PT SHORT TERM GOAL #1   Title Pt. will be compliant with initial HEP for LE strengthening and balance.    Time 3    Period Weeks    Status On-going   given initial HEP but not consistent, educated today on importance.   Target Date 12/31/20               PT Long Term Goals - 01/01/21 1625       PT LONG TERM GOAL #1   Title  Pt will be compliant with advanced HEP to improve outcomes.    Time 8    Period Weeks    Status On-going      PT LONG TERM GOAL #2   Title Pt will demonstrate improved functional strength as demonstrated by 5xSTS < 20 seconds    Baseline 26 seconds    Time 8    Period Weeks    Status On-going      PT LONG TERM GOAL #3   Title Pt will demonstrate safety with gait by score >49/56 on Berg    Baseline 44/56 indicates higher risk of falls and need for AD outdoors    Time 8    Period Weeks    Status New    Target Date 02/04/21      PT LONG TERM GOAL #4   Title Pt. will  score at least 19/24 on DGI to decrease risk of falls.    Baseline 15/24    Time 8    Period Weeks    Status New    Target Date 02/04/21      PT LONG TERM GOAL #5   Title Pt. will report 50% improvement in confidence with balance.    Time 8    Period Weeks    Status On-going                   Plan - 01/08/21 1709     Clinical Impression Statement Trishna reported increased back and knee pain with standing exercises last session, so focused on supine exercises for core/LE strengthening instead, which she tolerated very well, reporting less pressure on her R knee, although they were still very challenging.  HEP progressed with these exercises and YTB issued for clamshells.   She was able to perform tandem stance x 10 sec bil without UE support today.  Extremely challenged on airex when added head nods.  She would benefit from skilled physical therapy.    Personal Factors and Comorbidities Age;Comorbidity 3+    Comorbidities history R knee pain, venous insuffiency, frequent falls, GERD, depression, insomnia, ETOH dependence    PT Frequency 2x / week    PT Duration 8  weeks    PT Treatment/Interventions ADLs/Self Care Home Management;Cryotherapy;Electrical Stimulation;Moist Heat;Ultrasound;Iontophoresis 4mg /ml Dexamethasone;Functional mobility training;Therapeutic activities;Stair training;Gait  training;Therapeutic exercise;Balance training;Neuromuscular re-education;Patient/family education;Taping;Vestibular;Visual/perceptual remediation/compensation;Joint Manipulations    PT Next Visit Plan continue LE strengthening and balance as tolerated.    PT Home Exercise Plan Access Code: [email or web address]    Consulted and Agree with Plan of Care Patient             Patient will benefit from skilled therapeutic intervention in order to improve the following deficits and impairments:  Abnormal gait, Decreased activity tolerance, Decreased endurance, Decreased strength, Improper body mechanics, Pain, Postural dysfunction, Increased edema, Difficulty walking, Decreased safety awareness, Decreased mobility, Decreased balance  Visit Diagnosis: Repeated falls  Muscle weakness (generalized)  Other abnormalities of gait and mobility  Chronic pain of right knee     Problem List Patient Active Problem List   Diagnosis Date Noted   Osteopenia 10/13/2017   Insomnia 06/09/2016   Alcohol dependence (HCC) 01/13/2016   Paraesophageal hiatal hernia s/p robotic reduction/repair with mesh 01/09/2016 01/09/2016   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

## 2021-01-12 ENCOUNTER — Ambulatory Visit: Payer: Medicare Other | Admitting: Physical Therapy

## 2021-01-12 ENCOUNTER — Other Ambulatory Visit: Payer: Self-pay

## 2021-01-12 ENCOUNTER — Encounter: Payer: Self-pay | Admitting: Physical Therapy

## 2021-01-12 DIAGNOSIS — M6281 Muscle weakness (generalized): Secondary | ICD-10-CM

## 2021-01-12 DIAGNOSIS — M25561 Pain in right knee: Secondary | ICD-10-CM | POA: Diagnosis not present

## 2021-01-12 DIAGNOSIS — R296 Repeated falls: Secondary | ICD-10-CM

## 2021-01-12 DIAGNOSIS — R2689 Other abnormalities of gait and mobility: Secondary | ICD-10-CM | POA: Diagnosis not present

## 2021-01-12 DIAGNOSIS — G8929 Other chronic pain: Secondary | ICD-10-CM | POA: Diagnosis not present

## 2021-01-12 NOTE — Therapy (Signed)
[facility] [address]  [address] [place], [place], [address] Phone: [phone]   Fax:  [phone]  Physical Therapy Treatment  Patient Details  Name: Judy Weiss MRN: [id number] Date of Birth: October 21, 1945 Referring Provider (PT): Sandford Craze   Encounter Date: 01/12/2021   PT End of Session - 01/12/21 1707     Visit Number 6    Number of Visits 10    Date for PT Re-Evaluation 02/04/21    Authorization Type UHC Medicare    Progress Note Due on Visit 10    PT Start Time 1707    PT Stop Time 1745    PT Time Calculation (min) 38 min    Activity Tolerance Patient tolerated treatment well    Behavior During Therapy [facility] for tasks assessed/performed             Past Medical History:  Diagnosis Date   Allergy    food allergies and some additives. But she does not know what will cause.   Depression    GERD (gastroesophageal reflux disease)    had in past but now controlled. Has hiatal hernia.   Heart murmur    History of blood clots 1996?   left leg   History of colon polyps    History of hiatal hernia    Hyperlipidemia     Past Surgical History:  Procedure Laterality Date   APPENDECTOMY  1972   DILATION AND CURETTAGE OF UTERUS     HIATAL HERNIA REPAIR  2016   INSERTION OF MESH N/A 01/09/2016   Procedure: INSERTION OF MESH;  Surgeon: Axel Filler, MD;  Location: [facility];  Service: General;  Laterality: N/A;   KNEE SURGERY Bilateral    hx of torn meniscus (arthroscopic surgery)   LEG SURGERY Left 2001   Had veins stripped    SHOULDER SURGERY Right    reports hx arthroscopic surgery    There were no vitals filed for this visit.   Subjective Assessment - 01/12/21 1708     Subjective Pt. reports she is sore today, thinks its from her exercises.  Has been doing some of her exercises every day.    Pertinent History scoliosis and LBP, bil edema, venous insufficiency, history of DVT    Diagnostic  tests Xray R knee 11/18/2020 - IMPRESSION:  1. No acute or healing fracture.  2. 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 Small knee joint effusion.  3. Mild anterior prepatellar soft tissue edema.    Patient Stated Goals hope my balance is better    Currently in Pain? Yes    Pain Score 3     Pain Location Knee    Pain Orientation Left                               [facility] Adult PT Treatment/Exercise - 01/12/21 0001       Exercises   Exercises Knee/Hip      Knee/Hip Exercises: Aerobic   Nustep L5 x 6 min   no break needed today                Balance Exercises - 01/12/21 0001       Balance Exercises: Standing   Standing Eyes Opened Foam/compliant surface;30 secs;Limitations    Standing Eyes Opened Limitations in corner or safety, progressed to head nods, extremely challenging, CGA for safety    Standing  Eyes Closed Solid surface;Other reps (comment);Limitations    Standing Eyes Closed Limitations in corner for safety, 30 sec eyes closed, head nods x 10 EC, head turns x 10, CGA for safety, increased sway.    Standing, One Foot on a Step Eyes open;2 inch;Other reps (comment);Limitations    Standing, One Foot on a Step Limitations toe taps onto step, x 10 bil with mirror for feedback and CGA for safety    Stepping Strategy Anterior;Lateral;10 reps;Limitations    Stepping Strategy Limitations stepping f/b over YTB with mirror to maintain posture, x 10 bil, CGA for safety, challenged taking very small step.  Improved with lateral  stepping over YTB, no difficulty.    Other Standing Exercises church pews x 20    Other Standing Exercises Comments perturbations all directions x 1 min                  PT Short Term Goals - 01/01/21 1645       PT SHORT TERM GOAL #1   Title Pt. will be compliant with initial HEP for LE strengthening and balance.    Time 3    Period Weeks    Status On-going   given initial HEP  but not consistent, educated today on importance.   Target Date 12/31/20               PT Long Term Goals - 01/01/21 1625       PT LONG TERM GOAL #1   Title Pt will be compliant with advanced HEP to improve outcomes.    Time 8    Period Weeks    Status On-going      PT LONG TERM GOAL #2   Title Pt will demonstrate improved functional strength as demonstrated by 5xSTS < 20 seconds    Baseline 26 seconds    Time 8    Period Weeks    Status On-going      PT LONG TERM GOAL #3   Title Pt will demonstrate safety with gait by score >49/56 on Berg    Baseline 44/56 indicates higher risk of falls and need for AD outdoors    Time 8    Period Weeks    Status New    Target Date 02/04/21      PT LONG TERM GOAL #4   Title Pt. will  score at least 19/24 on DGI to decrease risk of falls.    Baseline 15/24    Time 8    Period Weeks    Status New    Target Date 02/04/21      PT LONG TERM GOAL #5   Title Pt. will report 50% improvement in confidence with balance.    Time 8    Period Weeks    Status On-going                   Plan - 01/12/21 1803     Clinical Impression Statement Judy Weiss was able to tolerate standing balance exercises today.  Still very challenged with standing on compliant surface and eyes closed activities, even on firm surface.  She was also challenged with toe taps and forward stepping strategy, but easily able to perform side steps.  She would benefit from continued skilled therapy.    Personal Factors and Comorbidities Age;Comorbidity 3+    Comorbidities history R knee pain, venous insuffiency, frequent falls, GERD, depression, insomnia, ETOH dependence    PT Frequency 2x / week    PT  Duration 8 weeks    PT Treatment/Interventions ADLs/Self Care Home Management;Cryotherapy;Electrical Stimulation;Moist Heat;Ultrasound;Iontophoresis 4mg /ml Dexamethasone;Functional mobility training;Therapeutic activities;Stair training;Gait training;Therapeutic  exercise;Balance training;Neuromuscular re-education;Patient/family education;Taping;Vestibular;Visual/perceptual remediation/compensation;Joint Manipulations    PT Next Visit Plan continue LE strengthening and balance as tolerated.    PT Home Exercise Plan Access Code: [email or web address]    Consulted and Agree with Plan of Care Patient             Patient will benefit from skilled therapeutic intervention in order to improve the following deficits and impairments:  Abnormal gait, Decreased activity tolerance, Decreased endurance, Decreased strength, Improper body mechanics, Pain, Postural dysfunction, Increased edema, Difficulty walking, Decreased safety awareness, Decreased mobility, Decreased balance  Visit Diagnosis: Repeated falls  Muscle weakness (generalized)  Other abnormalities of gait and mobility  Chronic pain of right knee     Problem List Patient Active Problem List   Diagnosis Date Noted   Osteopenia 10/13/2017   Insomnia 06/09/2016   Alcohol dependence (HCC) 01/13/2016   Paraesophageal hiatal hernia s/p robotic reduction/repair with mesh 01/09/2016 01/09/2016   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

## 2021-01-15 ENCOUNTER — Encounter: Payer: Self-pay | Admitting: Physical Therapy

## 2021-01-15 ENCOUNTER — Ambulatory Visit: Payer: Medicare Other | Admitting: Physical Therapy

## 2021-01-15 ENCOUNTER — Other Ambulatory Visit: Payer: Self-pay

## 2021-01-15 DIAGNOSIS — R2689 Other abnormalities of gait and mobility: Secondary | ICD-10-CM

## 2021-01-15 DIAGNOSIS — M6281 Muscle weakness (generalized): Secondary | ICD-10-CM

## 2021-01-15 DIAGNOSIS — G8929 Other chronic pain: Secondary | ICD-10-CM

## 2021-01-15 DIAGNOSIS — M25561 Pain in right knee: Secondary | ICD-10-CM | POA: Diagnosis not present

## 2021-01-15 DIAGNOSIS — R296 Repeated falls: Secondary | ICD-10-CM

## 2021-01-15 NOTE — Therapy (Signed)
[facility] [address]  [place], [place], [address] Phone: [phone]   Fax:  [phone]  Physical Therapy Treatment  Patient Details  Name: Judy Weiss MRN: [id number] Date of Birth: [birth year] Referring Provider (PT): Debbrah Alar   Encounter Date: 01/15/2021   PT End of Session - 01/15/21 1620     Visit Number 7    Number of Visits 10    Date for PT Re-Evaluation 02/04/21    Authorization Type [employer] Medicare    Progress Note Due on Visit 10    PT Start Time 1618    PT Stop Time 1700    PT Time Calculation (min) 42 min    Equipment Utilized During Treatment Gait belt    Activity Tolerance Patient tolerated treatment well    Behavior During Therapy WFL for tasks assessed/performed             Past Medical History:  Diagnosis Date   Allergy    food allergies and some additives. But she does not know what will cause.   Depression    GERD (gastroesophageal reflux disease)    had in past but now controlled. Has hiatal hernia.   Heart murmur    History of blood clots 1996?   left leg   History of colon polyps    History of hiatal hernia    Hyperlipidemia     Past Surgical History:  Procedure Laterality Date   APPENDECTOMY  1972   DILATION AND CURETTAGE OF UTERUS     HIATAL HERNIA REPAIR  2016   INSERTION OF MESH N/A 01/09/2016   Procedure: INSERTION OF MESH;  Surgeon: Ralene Ok, MD;  Location: [facility];  Service: General;  Laterality: N/A;   KNEE SURGERY Bilateral    hx of torn meniscus (arthroscopic surgery)   LEG SURGERY Left 2001   Had veins stripped    SHOULDER SURGERY Right    reports hx arthroscopic surgery    There were no vitals filed for this visit.   Subjective Assessment - 01/15/21 1619     Subjective Pt. reports she is feeling good today.  She's been practicing her corner exercises.    Pertinent History scoliosis and LBP, bil edema, venous insufficiency, history  of DVT    Diagnostic tests Xray R knee 11/18/2020 - IMPRESSION:  1. No acute or healing fracture.  2. 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 Small knee joint effusion.  3. Mild anterior prepatellar soft tissue edema.    Patient Stated Goals hope my balance is better                               [facility] Adult PT Treatment/Exercise - 01/15/21 0001       Exercises   Exercises Knee/Hip      Knee/Hip Exercises: Aerobic   Nustep L5 x 6 min                 Balance Exercises - 01/15/21 0001       Balance Exercises: Standing   Standing Eyes Opened Narrow base of support (BOS);Solid surface    Standing Eyes Opened Limitations in corner or safety, progressed to head nods, extremely challenging, CGA for safety    Standing Eyes Closed Solid surface;Other reps (comment);Limitations    Standing Eyes Closed Limitations in corner for safety, 30 sec eyes closed, head nods x 10 EC,  head turns x 10, CGA for safety, increased sway.    Gait with Head Turns Forward;Limitations    Gait with Head Turns Limitations x 75' with horizontal head turns, x 52' with head nods, CGA for safety throughout, multiple small LOB with head turns. Added ball toss with gait x 37' with CGA for safety.    Tandem Gait Forward;Retro;Upper extremity support;Limitations    Tandem Gait Limitations x 50' each direction with 1 hand on wall and CGA    Sidestepping Other reps (comment);Limitations    Sidestepping Limitations x 50' each direction with ball toss, 1 LOB stepping back to wall to recover balance.    Sit to Stand Without upper extremity support;Limitations    Sit to Stand Limitations VC and demo for technique, fatigued after 4                PT Education - 01/15/21 1759     Education Details perform repeated sit to stands frequently throughout day for strengthening.    Person(s) Educated Patient    Methods Explanation;Demonstration     Comprehension Verbalized understanding;Returned demonstration              PT Short Term Goals - 01/01/21 1645       PT SHORT TERM GOAL #1   Title Pt. will be compliant with initial HEP for LE strengthening and balance.    Time 3    Period Weeks    Status On-going   given initial HEP but not consistent, educated today on importance.   Target Date 12/31/20               PT Long Term Goals - 01/15/21 1800       PT LONG TERM GOAL #1   Title Pt will be compliant with advanced HEP to improve outcomes.    Time 8    Period Weeks    Status On-going   9/29- educated on importance of HEP.  met for current.     PT LONG TERM GOAL #2   Title Pt will demonstrate improved functional strength as demonstrated by 5xSTS < 20 seconds    Baseline 26 seconds    Time 8    Period Weeks    Status On-going   9/29- given sit to stands for HEP   Target Date 02/04/21      PT LONG TERM GOAL #3   Title Pt will demonstrate safety with gait by score >49/56 on Berg    Baseline 44/56 indicates higher risk of falls and need for AD outdoors    Time 8    Period Weeks    Status On-going    Target Date 02/04/21      PT LONG TERM GOAL #4   Title Pt. will  score at least 19/24 on DGI to decrease risk of falls.    Baseline 15/24    Time 8    Period Weeks    Status On-going   9/29- incorporated elements of DGI in session   Target Date 02/04/21      PT LONG TERM GOAL #5   Title Pt. will report 50% improvement in confidence with balance.    Time 8    Period Weeks    Status On-going                   Plan - 01/15/21 1758     Clinical Impression Statement Judy Weiss is making good progress, demonstrating improved endurance with nustep today.  She  is also making progress with balance exercises, although still very challenged with eyes closed.  She also had frequent small LOB and path deviations with head turns during gait, but was able to perform vertical head movements without LOB,  progressing to ball toss with walking.  She would benefit from continued skilled therapy to improve balance and safety.    Personal Factors and Comorbidities Age;Comorbidity 3+    Comorbidities history R knee pain, venous insuffiency, frequent falls, GERD, depression, insomnia, ETOH dependence    PT Frequency 2x / week    PT Duration 8 weeks    PT Treatment/Interventions ADLs/Self Care Home Management;Cryotherapy;Electrical Stimulation;Moist Heat;Ultrasound;Iontophoresis 39m/ml Dexamethasone;Functional mobility training;Therapeutic activities;Stair training;Gait training;Therapeutic exercise;Balance training;Neuromuscular re-education;Patient/family education;Taping;Vestibular;Visual/perceptual remediation/compensation;Joint Manipulations    PT Next Visit Plan continue LE strengthening and balance as tolerated.    PT Home Exercise Plan Access Code: [email or web address]    Consulted and Agree with Plan of Care Patient             Patient will benefit from skilled therapeutic intervention in order to improve the following deficits and impairments:  Abnormal gait, Decreased activity tolerance, Decreased endurance, Decreased strength, Improper body mechanics, Pain, Postural dysfunction, Increased edema, Difficulty walking, Decreased safety awareness, Decreased mobility, Decreased balance  Visit Diagnosis: Repeated falls  Muscle weakness (generalized)  Other abnormalities of gait and mobility  Chronic pain of right knee     Problem List Patient Active Problem List   Diagnosis Date Noted   Osteopenia 10/13/2017   Insomnia 06/09/2016   Alcohol dependence ([place] 01/13/2016   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 [place] [address]Phone: [phone]  Fax:  [phone] Name: Judy HogeMRN: [id number] of Birth: 102-21-1947

## 2021-01-16 ENCOUNTER — Other Ambulatory Visit: Payer: Self-pay | Admitting: Family

## 2021-01-19 ENCOUNTER — Other Ambulatory Visit: Payer: Self-pay

## 2021-01-19 ENCOUNTER — Ambulatory Visit: Payer: Medicare Other | Attending: Family

## 2021-01-19 DIAGNOSIS — G8929 Other chronic pain: Secondary | ICD-10-CM | POA: Diagnosis not present

## 2021-01-19 DIAGNOSIS — M6281 Muscle weakness (generalized): Secondary | ICD-10-CM | POA: Diagnosis not present

## 2021-01-19 DIAGNOSIS — R296 Repeated falls: Secondary | ICD-10-CM | POA: Diagnosis not present

## 2021-01-19 DIAGNOSIS — M25561 Pain in right knee: Secondary | ICD-10-CM | POA: Insufficient documentation

## 2021-01-19 DIAGNOSIS — R2689 Other abnormalities of gait and mobility: Secondary | ICD-10-CM | POA: Insufficient documentation

## 2021-01-19 NOTE — Therapy (Signed)
[facility] [address]  [place], [place], [address] Phone: [phone]   Fax:  [phone]  Physical Therapy Treatment  Patient Details  Name: Judy Weiss MRN: [id number] Date of Birth: 25-Mar-1946 Referring Provider (PT): Debbrah Alar   Encounter Date: 01/19/2021   PT End of Session - 01/19/21 1616     Visit Number 8    Number of Visits 10    Date for PT Re-Evaluation 02/04/21    Authorization Type UHC Medicare    Progress Note Due on Visit 10    PT Start Time 1536   pt late   PT Stop Time 1615    PT Time Calculation (min) 39 min    Equipment Utilized During Treatment Gait belt    Activity Tolerance Patient tolerated treatment well    Behavior During Therapy WFL for tasks assessed/performed             Past Medical History:  Diagnosis Date   Allergy    food allergies and some additives. But she does not know what will cause.   Depression    GERD (gastroesophageal reflux disease)    had in past but now controlled. Has hiatal hernia.   Heart murmur    History of blood clots 1996?   left leg   History of colon polyps    History of hiatal hernia    Hyperlipidemia     Past Surgical History:  Procedure Laterality Date   APPENDECTOMY  1972   DILATION AND CURETTAGE OF UTERUS     HIATAL HERNIA REPAIR  2016   INSERTION OF MESH N/A 01/09/2016   Procedure: INSERTION OF MESH;  Surgeon: Ralene Ok, MD;  Location: [facility];  Service: General;  Laterality: N/A;   KNEE SURGERY Bilateral    hx of torn meniscus (arthroscopic surgery)   LEG SURGERY Left 2001   Had veins stripped    SHOULDER SURGERY Right    reports hx arthroscopic surgery    There were no vitals filed for this visit.   Subjective Assessment - 01/19/21 1541     Subjective Pt reports that she feels good today, has been trying her best to keep up with HEP.    Pertinent History scoliosis and LBP, bil edema, venous insufficiency,  history of DVT    Diagnostic tests Xray R knee 11/18/2020 - IMPRESSION:  1. No acute or healing fracture.  2. 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 Small knee joint effusion.  3. Mild anterior prepatellar soft tissue edema.    Patient Stated Goals hope my balance is better    Currently in Pain? No/denies                               [facility] Adult PT Treatment/Exercise - 01/19/21 0001       Exercises   Exercises Knee/Hip      Knee/Hip Exercises: Aerobic   Recumbent Bike L1x44mn      Knee/Hip Exercises: Standing   Hip Extension Stengthening;Both;2 sets;10 reps    Extension Limitations counter support    Other Standing Knee Exercises B hs curls with counter support 2x10                 Balance Exercises - 01/19/21 0001       Balance Exercises: Standing   Standing Eyes Closed Other reps (comment);Limitations;Foam/compliant surface    Standing Eyes  Closed Limitations in corner for safety, 3x10"    Other Standing Exercises alt opposite arm/leg raise in corner 2x10    Other Standing Exercises Comments B fwd step with contralateral arm reach 10 reps with wall support                  PT Short Term Goals - 01/01/21 1645       PT SHORT TERM GOAL #1   Title Pt. will be compliant with initial HEP for LE strengthening and balance.    Time 3    Period Weeks    Status On-going   given initial HEP but not consistent, educated today on importance.   Target Date 12/31/20               PT Long Term Goals - 01/15/21 1800       PT LONG TERM GOAL #1   Title Pt will be compliant with advanced HEP to improve outcomes.    Time 8    Period Weeks    Status On-going   9/29- educated on importance of HEP.  met for current.     PT LONG TERM GOAL #2   Title Pt will demonstrate improved functional strength as demonstrated by 5xSTS < 20 seconds    Baseline 26 seconds    Time 8    Period Weeks    Status  On-going   9/29- given sit to stands for HEP   Target Date 02/04/21      PT LONG TERM GOAL #3   Title Pt will demonstrate safety with gait by score >49/56 on Berg    Baseline 44/56 indicates higher risk of falls and need for AD outdoors    Time 8    Period Weeks    Status On-going    Target Date 02/04/21      PT LONG TERM GOAL #4   Title Pt. will  score at least 19/24 on DGI to decrease risk of falls.    Baseline 15/24    Time 8    Period Weeks    Status On-going   9/29- incorporated elements of DGI in session   Target Date 02/04/21      PT LONG TERM GOAL #5   Title Pt. will report 50% improvement in confidence with balance.    Time 8    Period Weeks    Status On-going                   Plan - 01/19/21 1617     Clinical Impression Statement Pt demonstrated an increase in exercise tolerance today, however showed LOB with some progressed balance exercises. Exercises that required SLS and EC were the biggest challenge. SBA was required and performed balance exercises on the corner for max safety. She did demonstrate some fatigue with the exercises but denied requiring seated breaks. Pt responded well and w/o any complaints post session.    Personal Factors and Comorbidities Age;Comorbidity 3+    Comorbidities history R knee pain, venous insuffiency, frequent falls, GERD, depression, insomnia, ETOH dependence    PT Frequency 2x / week    PT Duration 8 weeks    PT Treatment/Interventions ADLs/Self Care Home Management;Cryotherapy;Electrical Stimulation;Moist Heat;Ultrasound;Iontophoresis 36m/ml Dexamethasone;Functional mobility training;Therapeutic activities;Stair training;Gait training;Therapeutic exercise;Balance training;Neuromuscular re-education;Patient/family education;Taping;Vestibular;Visual/perceptual remediation/compensation;Joint Manipulations    PT Next Visit Plan continue LE strengthening and balance as tolerated.    PT Home Exercise Plan Access Code: [email or web address]     Consulted and Agree with Plan  of Care Patient             Patient will benefit from skilled therapeutic intervention in order to improve the following deficits and impairments:  Abnormal gait, Decreased activity tolerance, Decreased endurance, Decreased strength, Improper body mechanics, Pain, Postural dysfunction, Increased edema, Difficulty walking, Decreased safety awareness, Decreased mobility, Decreased balance  Visit Diagnosis: Repeated falls  Muscle weakness (generalized)  Other abnormalities of gait and mobility  Chronic pain of right knee     Problem List Patient Active Problem List   Diagnosis Date Noted   Osteopenia 10/13/2017   Insomnia 06/09/2016   Alcohol dependence ([place]) 01/13/2016   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

## 2021-01-22 ENCOUNTER — Encounter: Payer: Self-pay | Admitting: Physical Therapy

## 2021-01-22 ENCOUNTER — Other Ambulatory Visit: Payer: Self-pay

## 2021-01-22 ENCOUNTER — Ambulatory Visit: Payer: Medicare Other | Admitting: Physical Therapy

## 2021-01-22 DIAGNOSIS — R296 Repeated falls: Secondary | ICD-10-CM

## 2021-01-22 DIAGNOSIS — R2689 Other abnormalities of gait and mobility: Secondary | ICD-10-CM

## 2021-01-22 DIAGNOSIS — G8929 Other chronic pain: Secondary | ICD-10-CM

## 2021-01-22 DIAGNOSIS — M6281 Muscle weakness (generalized): Secondary | ICD-10-CM

## 2021-01-22 DIAGNOSIS — M25561 Pain in right knee: Secondary | ICD-10-CM | POA: Diagnosis not present

## 2021-01-22 NOTE — Patient Instructions (Signed)
Access Code: [email or web address] URL: [email or web address] Date: 01/22/2021 Prepared by: Harrie Foreman  Exercises Supine Lower Trunk Rotation - 1 x daily - 7 x weekly - 2 sets - 10 reps Supine Posterior Pelvic Tilt - 1 x daily - 7 x weekly - 2 sets - 10 reps - 5 sec hold Supine Bridge - 1 x daily - 7 x weekly - 2 sets - 10 reps Clamshell - 1 x daily - 7 x weekly - 2 sets - 10 reps Small Range Straight Leg Raise - 1 x daily - 7 x weekly - 2 sets - 10 reps

## 2021-01-22 NOTE — Therapy (Signed)
[facility] [address]  [place], [place], [address] Phone: [phone]   Fax:  [phone]  Physical Therapy Treatment  Patient Details  Name: Judy Weiss MRN: [id number] Date of Birth: November 05, 1945 Referring Provider (PT): Debbrah Alar   Encounter Date: 01/22/2021   PT End of Session - 01/22/21 1550     Visit Number 9    Number of Visits 10    Date for PT Re-Evaluation 02/04/21    Authorization Type UHC Medicare    Progress Note Due on Visit 10    PT Start Time 1535    PT Stop Time 1613    PT Time Calculation (min) 38 min    Equipment Utilized During Treatment Gait belt    Activity Tolerance Patient tolerated treatment well    Behavior During Therapy WFL for tasks assessed/performed             Past Medical History:  Diagnosis Date   Allergy    food allergies and some additives. But she does not know what will cause.   Depression    GERD (gastroesophageal reflux disease)    had in past but now controlled. Has hiatal hernia.   Heart murmur    History of blood clots 1996?   left leg   History of colon polyps    History of hiatal hernia    Hyperlipidemia     Past Surgical History:  Procedure Laterality Date   APPENDECTOMY  1972   DILATION AND CURETTAGE OF UTERUS     HIATAL HERNIA REPAIR  2016   INSERTION OF MESH N/A 01/09/2016   Procedure: INSERTION OF MESH;  Surgeon: Ralene Ok, MD;  Location: [facility];  Service: General;  Laterality: N/A;   KNEE SURGERY Bilateral    hx of torn meniscus (arthroscopic surgery)   LEG SURGERY Left 2001   Had veins stripped    SHOULDER SURGERY Right    reports hx arthroscopic surgery    There were no vitals filed for this visit.   Subjective Assessment - 01/22/21 1537     Subjective Patient reported little tired today, helped daughter move. Said her daughter noticed that she is doing better and getting stronger.  Knees feel shaky and weak.     Pertinent History scoliosis and LBP, bil edema, venous insufficiency, history of DVT    Diagnostic tests Xray R knee 11/18/2020 - IMPRESSION:  1. No acute or healing fracture.  2. 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 Small knee joint effusion.  3. Mild anterior prepatellar soft tissue edema.    Patient Stated Goals hope my balance is better    Currently in Pain? Yes                               [facility] Adult PT Treatment/Exercise - 01/22/21 0001       Exercises   Exercises Knee/Hip      Lumbar Exercises: Supine   Pelvic Tilt 20 reps    Pelvic Tilt Limitations TC & VC for technique    Bridge 10 reps    Straight Leg Raise 20 reps    Straight Leg Raises Limitations cues for TrA contraction to decrease lumbar strain      Lumbar Exercises: Sidelying   Clam 20 reps    Clam Limitations cues for technique      Knee/Hip Exercises: Aerobic   Recumbent Bike  L1 x 6 min                     PT Education - 01/22/21 1725     Education Details HEP update Access Code: [email or web address]    Person(s) Educated Patient    Methods Explanation;Demonstration;Handout;Verbal cues    Comprehension Verbalized understanding;Returned demonstration              PT Short Term Goals - 01/22/21 1729       PT SHORT TERM GOAL #1   Title Pt. will be compliant with initial HEP for LE strengthening and balance.    Time 3    Period Weeks    Status Achieved   01/22/21- met   Target Date 12/31/20               PT Long Term Goals - 01/22/21 1735       PT LONG TERM GOAL #1   Title Pt will be compliant with advanced HEP to improve outcomes.    Time 8    Period Weeks    Status On-going   9/29- educated on importance of HEP.  10/6 - HEP update     PT LONG TERM GOAL #2   Title Pt will demonstrate improved functional strength as demonstrated by 5xSTS < 20 seconds    Baseline 26 seconds    Time 8    Period Weeks    Status  On-going   9/29- given sit to stands for HEP     PT LONG TERM GOAL #3   Title Pt will demonstrate safety with gait by score >49/56 on Berg    Baseline 44/56 indicates higher risk of falls and need for AD outdoors    Time 8    Period Weeks    Status On-going      PT LONG TERM GOAL #4   Title Pt. will  score at least 19/24 on DGI to decrease risk of falls.    Baseline 15/24    Time 8    Period Weeks    Status On-going   9/29- incorporated elements of DGI in session     PT LONG TERM GOAL #5   Title Pt. will report 50% improvement in confidence with balance.    Time 8    Period Weeks    Status On-going   01/22/21- reports no LOB or falls since starting PT                  Plan - 01/22/21 1726     Clinical Impression Statement Judy Weiss reports overall improvement in LE strength, but today felt tired and shaky in knees, so focused exercises on mat level exercises to improve hip and lumbar strength.  She tolerated these well with minimal report of discomfort in R side low back.  At end of session requested starting to work on machine exercises, as she has done these in the past and enjoyed.  She would benefit from continued skilled therapy to improve strength and balance to prevent falls.    Personal Factors and Comorbidities Age;Comorbidity 3+    Comorbidities history R knee pain, venous insuffiency, frequent falls, GERD, depression, insomnia, ETOH dependence    PT Frequency 2x / week    PT Duration 8 weeks    PT Treatment/Interventions ADLs/Self Care Home Management;Cryotherapy;Electrical Stimulation;Moist Heat;Ultrasound;Iontophoresis 98m/ml Dexamethasone;Functional mobility training;Therapeutic activities;Stair training;Gait training;Therapeutic exercise;Balance training;Neuromuscular re-education;Patient/family education;Taping;Vestibular;Visual/perceptual remediation/compensation;Joint Manipulations    PT Next Visit Plan Progress note, retest BERG and  DGI, start machine  exercises.    PT Home Exercise Plan Access Code: [email or web address]    Consulted and Agree with Plan of Care Patient             Patient will benefit from skilled therapeutic intervention in order to improve the following deficits and impairments:  Abnormal gait, Decreased activity tolerance, Decreased endurance, Decreased strength, Improper body mechanics, Pain, Postural dysfunction, Increased edema, Difficulty walking, Decreased safety awareness, Decreased mobility, Decreased balance  Visit Diagnosis: Repeated falls  Muscle weakness (generalized)  Other abnormalities of gait and mobility  Chronic pain of right knee     Problem List Patient Active Problem List   Diagnosis Date Noted   Osteopenia 10/13/2017   Insomnia 06/09/2016   Alcohol dependence ([place]) 01/13/2016   Paraesophageal hiatal hernia s/p robotic reduction/repair with mesh 01/09/2016 01/09/2016   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

## 2021-01-28 ENCOUNTER — Ambulatory Visit: Payer: Medicare Other | Admitting: Physical Therapy

## 2021-02-04 ENCOUNTER — Encounter: Payer: Self-pay | Admitting: Physical Therapy

## 2021-02-04 ENCOUNTER — Ambulatory Visit: Payer: Medicare Other | Admitting: Physical Therapy

## 2021-02-04 ENCOUNTER — Other Ambulatory Visit: Payer: Self-pay

## 2021-02-04 DIAGNOSIS — R296 Repeated falls: Secondary | ICD-10-CM | POA: Diagnosis not present

## 2021-02-04 DIAGNOSIS — G8929 Other chronic pain: Secondary | ICD-10-CM | POA: Diagnosis not present

## 2021-02-04 DIAGNOSIS — M6281 Muscle weakness (generalized): Secondary | ICD-10-CM | POA: Diagnosis not present

## 2021-02-04 DIAGNOSIS — R2689 Other abnormalities of gait and mobility: Secondary | ICD-10-CM | POA: Diagnosis not present

## 2021-02-04 DIAGNOSIS — M25561 Pain in right knee: Secondary | ICD-10-CM | POA: Diagnosis not present

## 2021-02-04 NOTE — Therapy (Signed)
[facility] [address]  [place], [place], [address] Phone: [phone]   Fax:  [phone]  Physical Therapy Discharge  PHYSICAL THERAPY DISCHARGE SUMMARY  Visits from Start of Care: 10  Current functional level related to goals / functional outcomes: Decreased fall risk, see note below   Remaining deficits: Decreased endurance    Education / Equipment: HEP  Plan: Patient agrees to discharge.   Patient is being discharged due to meeting the stated rehab goals.       Patient Details  Name: Judy Weiss MRN: [id number] Date of Birth: 1945-10-12 Referring Provider (PT): Debbrah Alar   Encounter Date: 02/04/2021   PT End of Session - 02/04/21 1630     Visit Number 10    Number of Visits 10    Date for PT Re-Evaluation 02/04/21    Authorization Type [employer] Medicare    Progress Note Due on Visit 10    PT Start Time 1545    PT Stop Time 1615    PT Time Calculation (min) 30 min    Equipment Utilized During Treatment Gait belt    Activity Tolerance Patient tolerated treatment well    Behavior During Therapy WFL for tasks assessed/performed             Past Medical History:  Diagnosis Date   Allergy    food allergies and some additives. But she does not know what will cause.   Depression    GERD (gastroesophageal reflux disease)    had in past but now controlled. Has hiatal hernia.   Heart murmur    History of blood clots 1996?   left leg   History of colon polyps    History of hiatal hernia    Hyperlipidemia     Past Surgical History:  Procedure Laterality Date   APPENDECTOMY  1972   DILATION AND CURETTAGE OF UTERUS     HIATAL HERNIA REPAIR  2016   INSERTION OF MESH N/A 01/09/2016   Procedure: INSERTION OF MESH;  Surgeon: Ralene Ok, MD;  Location: [facility];  Service: General;  Laterality: N/A;   KNEE SURGERY Bilateral    hx of torn meniscus (arthroscopic surgery)   LEG SURGERY  Left 2001   Had veins stripped    SHOULDER SURGERY Right    reports hx arthroscopic surgery    There were no vitals filed for this visit.   Subjective Assessment - 02/04/21 1543     Subjective Patient arrived 15 min late today after spiling coffee on clothes when leaving house.  She reports she feels much more confident overall with balance, at least 50% improvement.  She has fitness center close to her house that her insurance would pay for, so she would like to be discharge today to continue strengthening at the gym.  She feels PT has "got her started".    Pertinent History scoliosis and LBP, bil edema, venous insufficiency, history of DVT    Diagnostic tests Xray R knee 11/18/2020 - IMPRESSION:  1. No acute or healing fracture.  2. 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 Small knee joint effusion.  3. Mild anterior prepatellar soft tissue edema.    Patient Stated Goals hope my balance is better    Currently in Pain? No/denies                [facility] PT Assessment - 02/04/21 0001       Assessment  Medical Diagnosis R29.6 Falls frequently    Referring Provider (PT) Debbrah Alar    Next MD Visit 02/20/2021      Transfers   Five time sit to stand comments  15.5      Standardized Balance Assessment   Standardized Balance Assessment Berg Balance Test;Dynamic Gait Index      Berg Balance Test   Sit to Stand Able to stand without using hands and stabilize independently    Standing Unsupported Able to stand safely 2 minutes    Sitting with Back Unsupported but Feet Supported on Floor or Stool Able to sit safely and securely 2 minutes    Stand to Sit Sits safely with minimal use of hands    Transfers Able to transfer safely, minor use of hands    Standing Unsupported with Eyes Closed Able to stand 10 seconds safely    Standing Unsupported with Feet Together Able to place feet together independently and stand 1 minute safely    From  Standing, Reach Forward with Outstretched Arm Can reach confidently >25 cm (10")    From Standing Position, Pick up Object from Floor Able to pick up shoe safely and easily    From Standing Position, Turn to Look Behind Over each Shoulder Looks behind from both sides and weight shifts well    Turn 360 Degrees Able to turn 360 degrees safely in 4 seconds or less    Standing Unsupported, Alternately Place Feet on Step/Stool Able to stand independently and safely and complete 8 steps in 20 seconds    Standing Unsupported, One Foot in Front Needs help to step but can hold 15 seconds    Standing on One Leg Able to lift leg independently and hold 5-10 seconds    Total Score 52      Dynamic Gait Index   Level Surface Normal    Change in Gait Speed Mild Impairment    Gait with Horizontal Head Turns Mild Impairment    Gait with Vertical Head Turns Normal    Gait and Pivot Turn Normal    Step Over Obstacle Normal    Step Around Obstacles Normal    Steps Mild Impairment    Total Score 21                           [facility] Adult PT Treatment/Exercise - 02/04/21 0001       Self-Care   Other Self-Care Comments  see education                     PT Education - 02/04/21 1805     Education Details education on transition to community based exercise programs.    Person(s) Educated Patient    Methods Explanation;Handout;Demonstration    Comprehension Verbalized understanding              PT Short Term Goals - 01/22/21 1729       PT SHORT TERM GOAL #1   Title Pt. will be compliant with initial HEP for LE strengthening and balance.    Time 3    Period Weeks    Status Achieved   01/22/21- met   Target Date 12/31/20               PT Long Term Goals - 02/04/21 1544       PT LONG TERM GOAL #1   Title Pt will be compliant with advanced HEP to improve outcomes.  Time 8    Period Weeks    Status Achieved   9/29- educated on importance of HEP.  10/6 -  HEP update     PT LONG TERM GOAL #2   Title Pt will demonstrate improved functional strength as demonstrated by 5xSTS < 20 seconds    Baseline 26 seconds    Time 8    Period Weeks    Status Achieved   9/29- given sit to stands for HEP  02/04/21- 15.5 seconds     PT LONG TERM GOAL #3   Title Pt will demonstrate safety with gait by score >49/56 on Berg    Baseline 44/56 indicates higher risk of falls and need for AD outdoors    Time 8    Period Weeks    Status On-going      PT LONG TERM GOAL #4   Title Pt. will  score at least 19/24 on DGI to decrease risk of falls.    Baseline 15/24    Time 8    Period Weeks    Status Achieved   02/04/21- 21/24     PT LONG TERM GOAL #5   Title Pt. will report 50% improvement in confidence with balance.    Time 8    Period Weeks    Status Achieved   01/22/21- reports no LOB or falls since starting PT  02/04/21- at least 50% improvement                  Plan - 02/04/21 1802     Clinical Impression Statement Judy Weiss reports improved confidence in balance and demonstrates decreased fall risk as demonstrated by Merrilee Jansky 52/56 and DGI 22/24.  She has met all of her goals and requests discharge today.  We spent the remainder of the session discussing transition to gym based exercise program, selection of appropriate equipment and weights, and also provided information on mall walking program and senior fitness programs at [facility].  She is ready and appropriate for discharge.    Personal Factors and Comorbidities Age;Comorbidity 3+    Comorbidities history R knee pain, venous insuffiency, frequent falls, GERD, depression, insomnia, ETOH dependence    PT Frequency 2x / week    PT Duration 8 weeks    PT Treatment/Interventions ADLs/Self Care Home Management;Cryotherapy;Electrical Stimulation;Moist Heat;Ultrasound;Iontophoresis 25m/ml Dexamethasone;Functional mobility training;Therapeutic activities;Stair training;Gait  training;Therapeutic exercise;Balance training;Neuromuscular re-education;Patient/family education;Taping;Vestibular;Visual/perceptual remediation/compensation;Joint Manipulations    PT Next Visit Plan Progress note, retest BERG and DGI, start machine exercises.    PT Home Exercise Plan Access Code: [email or web address]    Consulted and Agree with Plan of Care Patient             Patient will benefit from skilled therapeutic intervention in order to improve the following deficits and impairments:  Abnormal gait, Decreased activity tolerance, Decreased endurance, Decreased strength, Improper body mechanics, Pain, Postural dysfunction, Increased edema, Difficulty walking, Decreased safety awareness, Decreased mobility, Decreased balance  Visit Diagnosis: Repeated falls  Muscle weakness (generalized)  Other abnormalities of gait and mobility     Problem List Patient Active Problem List   Diagnosis Date Noted   Osteopenia 10/13/2017   Insomnia 06/09/2016   Alcohol dependence ([place] 01/13/2016   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  [place], [place], [address] Phone: [phone]   Fax:  [phone]  Name: Judy Weiss MRN: [id number] Date of Birth: 1945/08/27

## 2021-02-16 ENCOUNTER — Other Ambulatory Visit: Payer: Self-pay | Admitting: Family

## 2021-02-16 DIAGNOSIS — G4700 Insomnia, unspecified: Secondary | ICD-10-CM

## 2021-02-20 ENCOUNTER — Encounter: Payer: Self-pay | Admitting: Family

## 2021-02-20 ENCOUNTER — Ambulatory Visit (INDEPENDENT_AMBULATORY_CARE_PROVIDER_SITE_OTHER): Payer: Medicare Other | Admitting: Family

## 2021-02-20 ENCOUNTER — Other Ambulatory Visit: Payer: Self-pay

## 2021-02-20 VITALS — BP 127/66 | HR 73 | Temp 98.2°F | Resp 16 | Wt 191.0 lb

## 2021-02-20 DIAGNOSIS — F32A Depression, unspecified: Secondary | ICD-10-CM | POA: Diagnosis not present

## 2021-02-20 DIAGNOSIS — G4700 Insomnia, unspecified: Secondary | ICD-10-CM

## 2021-02-20 DIAGNOSIS — K219 Gastro-esophageal reflux disease without esophagitis: Secondary | ICD-10-CM

## 2021-02-20 DIAGNOSIS — Z23 Encounter for immunization: Secondary | ICD-10-CM | POA: Diagnosis not present

## 2021-02-20 MED ORDER — PANTOPRAZOLE SODIUM 40 MG PO TBEC: DELAYED_RELEASE_TABLET | ORAL | 1 refills | Status: DC

## 2021-02-20 NOTE — Progress Notes (Signed)
Subjective:   By signing my name below, I, Judy Weiss, attest that this documentation has been prepared under the direction and in the presence of Sandford Craze, NP, 02/20/2021    Patient ID: Judy Weiss, female    DOB: December 26, 1945, 75 y.o.   MRN: [id number]  Chief Complaint  Patient presents with   Depression    Here for follow up   Gastroesophageal Reflux    Here for follow up, needs refill    HPI Patient is in today for an office visit.  Mood: She notes that she recently completed therapy. Now that her therapy is completed she plans to take what she learned there and continue it outside of therapy. She saw an improvement with the use of her therapy and compliance with 300 mg Wellbutrin.  Reflux: She mentions that she does not have "heart burn" but she is having reflux with her food. She is taking 40 mg Protonix to help with this. Sleep: She notes that the 5 mg Ambien has not been working as well as it used to for her. She is struggling with sleep currently.  Immunizations: She received her flu shot in the office today. She is interested in receiving her pneumonia booster in the office today and then the Covid-19 booster at a later date. She is not interested in getting the Shingrix series.   Health Maintenance Due  Topic Date Due   Hepatitis C Screening  Never done   TETANUS/TDAP  Never done   Zoster Vaccines- Shingrix (1 of 2) Never done   COVID-19 Vaccine (5 - Booster for Pfizer series) 12/29/2020    Past Medical History:  Diagnosis Date   Allergy    food allergies and some additives. But she does not know what will cause.   Depression    GERD (gastroesophageal reflux disease)    had in past but now controlled. Has hiatal hernia.   Heart murmur    History of blood clots 1996?   left leg   History of colon polyps    History of hiatal hernia    Hyperlipidemia     Past Surgical History:  Procedure Laterality Date   APPENDECTOMY  1972   DILATION AND  CURETTAGE OF UTERUS     HIATAL HERNIA REPAIR  2016   INSERTION OF MESH N/A 01/09/2016   Procedure: INSERTION OF MESH;  Surgeon: Axel Filler, MD;  Location: [facility];  Service: General;  Laterality: N/A;   KNEE SURGERY Bilateral    hx of torn meniscus (arthroscopic surgery)   LEG SURGERY Left 2001   Had veins stripped    SHOULDER SURGERY Right    reports hx arthroscopic surgery    Family History  Problem Relation Age of Onset   Diabetes Mother    Hypothyroidism Mother    Seizures Mother     Social History   Socioeconomic History   Marital status: Divorced    Spouse name: Not on file   Number of children: Not on file   Years of education: Not on file   Highest education level: Not on file  Occupational History   Not on file  Tobacco Use   Smoking status: Never   Smokeless tobacco: Never  Vaping Use   Vaping Use: Never used  Substance and Sexual Activity   Alcohol use: Yes    Comment: 2 cans "hard cider"/day   Drug use: No   Sexual activity: Not on file  Other Topics Concern   Not on file  Social History Narrative   Lives alone- can walk to her daughter's home   Daughter in [place]   Son in [place]   Son [place]   4 grandchildren (twin girls age 66)   Retired- Presenter, broadcasting at a Associate Professor x 25 years.   Divorced   4 cats and a dog   Enjoys antiquing.  Buys and sells.  Enjoys shopping   Social Determinants of Health   Financial Resource Strain: Low Risk    Difficulty of Paying Living Expenses: Not hard at all  Food Insecurity: No Food Insecurity   Worried About Programme researcher, broadcasting/film/video in the Last Year: Never true   Barista in the Last Year: Never true  Transportation Needs: No Transportation Needs   Lack of Transportation (Medical): No   Lack of Transportation (Non-Medical): No  Physical Activity: Inactive   Days of Exercise per Week: 0 days   Minutes of Exercise per Session: 0 min  Stress: No Stress Concern Present   Feeling of  Stress : Not at all  Social Connections: Socially Isolated   Frequency of Communication with Friends and Family: More than three times a week   Frequency of Social Gatherings with Friends and Family: More than three times a week   Attends Religious Services: Never   Database administrator or Organizations: No   Attends Engineer, structural: Never   Marital Status: Divorced  Catering manager Violence: Not At Risk   Fear of Current or Ex-Partner: No   Emotionally Abused: No   Physically Abused: No   Sexually Abused: No    Outpatient Medications Prior to Visit  Medication Sig Dispense Refill   aspirin EC 81 MG tablet Take 1 tablet (81 mg total) by mouth daily. (Patient taking differently: Take 162 mg by mouth daily. Take 2 tablets by mouth daily.)     buPROPion (WELLBUTRIN XL) 300 MG 24 hr tablet TAKE 1 TABLET BY MOUTH EVERY MORNING 90 tablet 1   citalopram (CELEXA) 40 MG tablet TAKE 1 TABLET BY MOUTH EVERY DAY 90 tablet 1   vitamin B-12 (CYANOCOBALAMIN) 500 MCG tablet Take 500 mcg by mouth daily.     VITAMIN D PO Take 2,000 Units by mouth daily.     zolpidem (AMBIEN) 5 MG tablet TAKE 1 TABLET BY MOUTH EVERY NIGHT AT BEDTIME AS NEEDED FOR SLEEP 30 tablet 0   pantoprazole (PROTONIX) 40 MG tablet TAKE 1 TABLET(40 MG) BY MOUTH DAILY 90 tablet 0   No facility-administered medications prior to visit.    No Known Allergies  Review of Systems  Gastrointestinal:  Positive for heartburn. Melena: she claims it as "reflux from food". Psychiatric/Behavioral:  The patient has insomnia (not helped with 5 mg ambien).       Objective:    Physical Exam Constitutional:      General: She is not in acute distress.    Appearance: Normal appearance. She is not ill-appearing.  HENT:     Head: Normocephalic and atraumatic.     Right Ear: External ear normal.     Left Ear: External ear normal.  Eyes:     Extraocular Movements: Extraocular movements intact.     Pupils: Pupils are equal,  round, and reactive to light.  Cardiovascular:     Rate and Rhythm: Normal rate and regular rhythm.     Heart sounds: Normal heart sounds. No murmur heard.   No gallop.  Pulmonary:  Effort: Pulmonary effort is normal. No respiratory distress.     Breath sounds: Normal breath sounds. No wheezing or rales.  Skin:    General: Skin is warm and dry.  Neurological:     Mental Status: She is alert and oriented to person, place, and time.  Psychiatric:        Behavior: Behavior normal.        Judgment: Judgment normal.    BP 127/66 (BP Location: Right Arm, Patient Position: Sitting, Cuff Size: Small)   Pulse 73   Temp 98.2 F (36.8 C) (Oral)   Resp 16   Wt 191 lb (86.6 kg)   SpO2 99%   BMI 34.93 kg/m  Wt Readings from Last 3 Encounters:  02/20/21 191 lb (86.6 kg)  11/18/20 191 lb (86.6 kg)  11/10/20 200 lb (90.7 kg)       Assessment & Plan:   Problem List Items Addressed This Visit       Unprioritized   Insomnia    We discussed good sleep hygiene techniques.  She inquired about increasing ambien dose, but I advised her this would not be a safe option given her age.       GERD (gastroesophageal reflux disease) s/p Nissen fundoplication 01/09/2016    Stable on protonix 40mg  once daily. Continue same.       Relevant Medications   pantoprazole (PROTONIX) 40 MG tablet   Depression    Improved following therapy.  Continue citalopram 40mg  once daily.       Other Visit Diagnoses     Needs flu shot    -  Primary   Relevant Orders   Flu Vaccine QUAD High Dose(Fluad) (Completed)   Need for 23-polyvalent pneumococcal polysaccharide vaccine       Relevant Orders   Pneumococcal polysaccharide vaccine 23-valent greater than or equal to 2yo subcutaneous/IM (Completed)      Meds ordered this encounter  Medications   pantoprazole (PROTONIX) 40 MG tablet    Sig: TAKE 1 TABLET(40 MG) BY MOUTH DAILY    Dispense:  90 tablet    Refill:  1    ZERO refills remain on this  prescription. Your patient is requesting advance approval of refills for this medication to PREVENT ANY MISSED DOSES    I, , NP, personally preformed the services described in this documentation.  All medical record entries made by the scribe were at my direction and in my presence.  I have reviewed the chart and discharge instructions (if applicable) and agree that the record reflects my personal performance and is accurate and complete. 02/20/2021  I,Judy Weiss,acting as a Sandford Craze for 13/07/2020, NP.,have documented all relevant documentation on the behalf of Neurosurgeon, NP,as directed by  Lemont Fillers, NP while in the presence of Lemont Fillers, NP.  Lemont Fillers, NP

## 2021-02-22 NOTE — Assessment & Plan Note (Signed)
Improved following therapy.  Continue citalopram 40mg  once daily.

## 2021-02-22 NOTE — Assessment & Plan Note (Signed)
Stable on protonix 40mg once daily. Continue same.  

## 2021-02-22 NOTE — Assessment & Plan Note (Signed)
We discussed good sleep hygiene techniques.  She inquired about increasing ambien dose, but I advised her this would not be a safe option given her age.

## 2021-04-16 ENCOUNTER — Ambulatory Visit: Payer: Medicare Other | Attending: Internal Medicine

## 2021-04-16 DIAGNOSIS — Z23 Encounter for immunization: Secondary | ICD-10-CM

## 2021-04-16 NOTE — Progress Notes (Signed)
° °  Covid-19 Vaccination Clinic  Name:  Judy Weiss    MRN: [id number] DOB: 1946-01-19  04/16/2021  [name] was observed post Covid-19 immunization for 15 minutes without incident. She was provided with Vaccine Information Sheet and instruction to access the V-Safe system.   [name] was instructed to call 911 with any severe reactions post vaccine: Difficulty breathing  Swelling of face and throat  A fast heartbeat  A bad rash all over body  Dizziness and weakness   Immunizations Administered     Name Date Dose VIS Date Route   Pfizer Covid-19 Vaccine Bivalent Booster 04/16/2021  3:09 PM 0.3 mL 12/17/2020 Intramuscular   Manufacturer: [employer], [employer]   Lot: [id number]   NDC: [phone]

## 2021-04-17 ENCOUNTER — Other Ambulatory Visit (HOSPITAL_BASED_OUTPATIENT_CLINIC_OR_DEPARTMENT_OTHER): Payer: Self-pay

## 2021-04-17 MED ORDER — PFIZER COVID-19 VAC BIVALENT 30 MCG/0.3ML IM SUSP
INTRAMUSCULAR | 0 refills | Status: DC
  Filled 2021-04-17: qty 0.3, 1d supply, fill #0

## 2021-07-03 ENCOUNTER — Other Ambulatory Visit: Payer: Self-pay | Admitting: Family

## 2021-07-03 DIAGNOSIS — G4700 Insomnia, unspecified: Secondary | ICD-10-CM

## 2021-07-03 NOTE — Telephone Encounter (Signed)
Requesting: Ambien 5mg  ?Contract:04/08/20 ?UDS: 11/18/2020 ?Last Visit: 02/20/2021 ?Next Visit: None ?Last Refill: 02/17/2021 #30 and 0RF ? ?Please Advise ? ?

## 2021-07-15 ENCOUNTER — Other Ambulatory Visit: Payer: Self-pay | Admitting: Family

## 2021-09-17 ENCOUNTER — Other Ambulatory Visit: Payer: Self-pay | Admitting: Family

## 2021-10-13 ENCOUNTER — Other Ambulatory Visit: Payer: Self-pay | Admitting: Family

## 2021-10-13 NOTE — Telephone Encounter (Signed)
Please contact pt to schedule a follow up visit.  

## 2021-10-23 ENCOUNTER — Ambulatory Visit (INDEPENDENT_AMBULATORY_CARE_PROVIDER_SITE_OTHER): Payer: Medicare Other | Admitting: Family

## 2021-10-23 VITALS — BP 116/66 | HR 77 | Temp 98.4°F | Resp 16 | Wt 182.0 lb

## 2021-10-23 DIAGNOSIS — K219 Gastro-esophageal reflux disease without esophagitis: Secondary | ICD-10-CM | POA: Diagnosis not present

## 2021-10-23 DIAGNOSIS — F32A Depression, unspecified: Secondary | ICD-10-CM

## 2021-10-23 DIAGNOSIS — R634 Abnormal weight loss: Secondary | ICD-10-CM | POA: Diagnosis not present

## 2021-10-23 DIAGNOSIS — G4700 Insomnia, unspecified: Secondary | ICD-10-CM | POA: Diagnosis not present

## 2021-10-23 DIAGNOSIS — Z1231 Encounter for screening mammogram for malignant neoplasm of breast: Secondary | ICD-10-CM

## 2021-10-23 DIAGNOSIS — E785 Hyperlipidemia, unspecified: Secondary | ICD-10-CM

## 2021-10-23 MED ORDER — CITALOPRAM HYDROBROMIDE 40 MG PO TABS: 40.0000 mg | ORAL_TABLET | Freq: Every day | ORAL | 1 refills | Status: DC

## 2021-10-23 MED ORDER — BUPROPION HCL ER (XL) 300 MG PO TB24: 300.0000 mg | ORAL_TABLET | Freq: Every morning | ORAL | 1 refills | Status: DC

## 2021-10-23 MED ORDER — ZOSTER VAC RECOMB ADJUVANTED 50 MCG/0.5ML IM SUSR: INTRAMUSCULAR | 0 refills | Status: DC

## 2021-10-23 NOTE — Assessment & Plan Note (Signed)
Fair, continues pantoprazole. Discussed importance of small meals and not eating late at night.

## 2021-10-23 NOTE — Patient Instructions (Signed)
Psychiatry- Dr. Donell Beers.

## 2021-10-23 NOTE — Assessment & Plan Note (Addendum)
Wt Readings from Last 3 Encounters:  10/23/21 182 lb (82.6 kg)  02/20/21 191 lb (86.6 kg)  11/18/20 191 lb (86.6 kg)   Has had some unintentional weight loss.  Uncontrolled on wellbutrin/citalopram.  I recommended referral to psychiatry.

## 2021-10-23 NOTE — Assessment & Plan Note (Signed)
2 drinks/night.

## 2021-10-23 NOTE — Progress Notes (Signed)
Subjective:   By signing my name below, I, [name], attest that this documentation has been prepared under the direction and in the presence of Sandford Craze, NP 10/23/2021    Patient ID: Judy Weiss, female    DOB: 1945/10/05, 76 y.o.   MRN: [id number]  No chief complaint on file.   HPI Patient is in today for an office visit.  Ambien- She is requesting a refill on Ambien.   Weight- She reports of losing 9 lbs recently. Her appetite is decreased recently.  Wt Readings from Last 3 Encounters:  10/23/21 182 lb (82.6 kg)  02/20/21 191 lb (86.6 kg)  11/18/20 191 lb (86.6 kg)   Mood- her mood has been down due to her dog passing away recently. She continues taking citalopram and Wellbutrin and reports no new issues while taking. She reports feeling a low period of depression (feeling lonely since dog has passed and feels deserted from her daughter as well) She also mentions her personal decisions are influenced from depression. She is recommended to seek a therapist.   Reflux- She continues  having reflux (not acid) denies burning and reports voice sounding horse, overeating, and has not improved. She still is taking pantoprazole. She reports the reflux coming at night time, and stops eating around 7-8. She drinks a maximum of 16 ounces of hard cider/beer cans  TMJ- She denies any pain from TMJ.  Immunizations- She has not received shingles shot, she has had it but is open to receiving vaccine.   Mammogram - last September will be due after the 6th.  Falls- She reports of two falls alone since last visit, also reports sweating, also one while driving, and feels it may be related to blood sugar. She reports of feeling weak.   Family history- Family members have been diagnosed with diabetes. Mother had to take thyroid medicine.      Past Medical History:  Diagnosis Date   Allergy    food allergies and some additives. But she does not know what will cause.    Depression    GERD (gastroesophageal reflux disease)    had in past but now controlled. Has hiatal hernia.   Heart murmur    History of blood clots 1996?   left leg   History of colon polyps    History of hiatal hernia    Hyperlipidemia     Past Surgical History:  Procedure Laterality Date   APPENDECTOMY  1972   DILATION AND CURETTAGE OF UTERUS     HIATAL HERNIA REPAIR  2016   INSERTION OF MESH N/A 01/09/2016   Procedure: INSERTION OF MESH;  Surgeon: Axel Filler, MD;  Location: [facility];  Service: General;  Laterality: N/A;   KNEE SURGERY Bilateral    hx of torn meniscus (arthroscopic surgery)   LEG SURGERY Left 2001   Had veins stripped    SHOULDER SURGERY Right    reports hx arthroscopic surgery    Family History  Problem Relation Age of Onset   Diabetes Mother    Hypothyroidism Mother    Seizures Mother     Social History   Socioeconomic History   Marital status: Divorced    Spouse name: Not on file   Number of children: Not on file   Years of education: Not on file   Highest education level: Not on file  Occupational History   Not on file  Tobacco Use   Smoking status: Never   Smokeless tobacco: Never  Vaping  Use   Vaping Use: Never used  Substance and Sexual Activity   Alcohol use: Yes    Comment: 2 cans "hard cider"/day   Drug use: No   Sexual activity: Not on file  Other Topics Concern   Not on file  Social History Narrative   Lives alone- can walk to her daughter's home   Daughter in [place]   Son in [place]   Son [place]   4 grandchildren (twin girls age 105)   Retired- Presenter, broadcasting at a Associate Professor x 25 years.   Divorced   4 cats and a dog   Enjoys antiquing.  Buys and sells.  Enjoys shopping   Social Determinants of Health   Financial Resource Strain: Low Risk  (11/10/2020)   Overall Financial Resource Strain (CARDIA)    Difficulty of Paying Living Expenses: Not hard at all  Food Insecurity: No Food Insecurity  (11/10/2020)   Hunger Vital Sign    Worried About Running Out of Food in the Last Year: Never true    Ran Out of Food in the Last Year: Never true  Transportation Needs: No Transportation Needs (11/10/2020)   PRAPARE - Administrator, Civil Service (Medical): No    Lack of Transportation (Non-Medical): No  Physical Activity: Inactive (11/10/2020)   Exercise Vital Sign    Days of Exercise per Week: 0 days    Minutes of Exercise per Session: 0 min  Stress: No Stress Concern Present (11/10/2020)   [employer] of Occupational Health - Occupational Stress Questionnaire    Feeling of Stress : Not at all  Social Connections: Socially Isolated (11/10/2020)   Social Connection and Isolation Panel [NHANES]    Frequency of Communication with Friends and Family: More than three times a week    Frequency of Social Gatherings with Friends and Family: More than three times a week    Attends Religious Services: Never    Database administrator or Organizations: No    Attends Banker Meetings: Never    Marital Status: Divorced  Catering manager Violence: Not At Risk (11/10/2020)   Humiliation, Afraid, Rape, and Kick questionnaire    Fear of Current or Ex-Partner: No    Emotionally Abused: No    Physically Abused: No    Sexually Abused: No    Outpatient Medications Prior to Visit  Medication Sig Dispense Refill   aspirin EC 81 MG tablet Take 1 tablet (81 mg total) by mouth daily. (Patient taking differently: Take 162 mg by mouth daily. Take 2 tablets by mouth daily.)     buPROPion (WELLBUTRIN XL) 300 MG 24 hr tablet TAKE 1 TABLET BY MOUTH EVERY MORNING 90 tablet 0   citalopram (CELEXA) 40 MG tablet TAKE 1 TABLET BY MOUTH EVERY DAY 90 tablet 0   COVID-19 mRNA bivalent vaccine, Pfizer, (PFIZER COVID-19 VAC BIVALENT) injection Inject into the muscle. 0.3 mL 0   pantoprazole (PROTONIX) 40 MG tablet TAKE 1 TABLET(40 MG) BY MOUTH DAILY 90 tablet 1   vitamin B-12 (CYANOCOBALAMIN)  500 MCG tablet Take 500 mcg by mouth daily.     VITAMIN D PO Take 2,000 Units by mouth daily.     zolpidem (AMBIEN) 5 MG tablet TAKE 1 TABLET BY MOUTH EVERY NIGHT AT BEDTIME AS NEEDED FOR SLEEP 30 tablet 0   No facility-administered medications prior to visit.    No Known Allergies  ROS     Objective:    Physical Exam Constitutional:  Appearance: Normal appearance. She is not ill-appearing.  HENT:     Head: Normocephalic and atraumatic.     Right Ear: External ear normal.     Left Ear: External ear normal.  Eyes:     Extraocular Movements: Extraocular movements intact.     Pupils: Pupils are equal, round, and reactive to light.  Cardiovascular:     Rate and Rhythm: Normal rate and regular rhythm.     Pulses: Normal pulses.     Heart sounds: Normal heart sounds. No murmur heard.    No gallop.  Pulmonary:     Effort: Pulmonary effort is normal. No respiratory distress.     Breath sounds: Normal breath sounds. No wheezing.  Skin:    General: Skin is warm and dry.  Neurological:     Mental Status: She is alert and oriented to person, place, and time.  Psychiatric:        Judgment: Judgment normal.     There were no vitals taken for this visit. Wt Readings from Last 3 Encounters:  02/20/21 191 lb (86.6 kg)  11/18/20 191 lb (86.6 kg)  11/10/20 200 lb (90.7 kg)    Diabetic Foot Exam - Simple   No data filed    Lab Results  Component Value Date   WBC 4.6 04/08/2020   HGB 13.2 04/08/2020   HCT 39.8 04/08/2020   PLT 232.0 04/08/2020   GLUCOSE 102 (H) 04/08/2020   CHOL 187 07/06/2016   TRIG 93.0 07/06/2016   HDL 60.30 07/06/2016   LDLCALC 108 (H) 07/06/2016   ALT 10 04/08/2020   AST 13 04/08/2020   NA 140 04/08/2020   K 4.7 04/08/2020   CL 104 04/08/2020   CREATININE 0.71 04/08/2020   BUN 19 04/08/2020   CO2 31 04/08/2020   TSH 3.26 03/25/2017   HGBA1C 5.7 04/08/2020    Lab Results  Component Value Date   TSH 3.26 03/25/2017   Lab Results   Component Value Date   WBC 4.6 04/08/2020   HGB 13.2 04/08/2020   HCT 39.8 04/08/2020   MCV 88.1 04/08/2020   PLT 232.0 04/08/2020   Lab Results  Component Value Date   NA 140 04/08/2020   K 4.7 04/08/2020   CO2 31 04/08/2020   GLUCOSE 102 (H) 04/08/2020   BUN 19 04/08/2020   CREATININE 0.71 04/08/2020   BILITOT 0.7 04/08/2020   ALKPHOS 55 04/08/2020   AST 13 04/08/2020   ALT 10 04/08/2020   PROT 6.7 04/08/2020   ALBUMIN 4.2 04/08/2020   CALCIUM 9.1 04/08/2020   ANIONGAP 9 12/13/2016   GFR 82.30 04/08/2020   Lab Results  Component Value Date   CHOL 187 07/06/2016   Lab Results  Component Value Date   HDL 60.30 07/06/2016   Lab Results  Component Value Date   LDLCALC 108 (H) 07/06/2016   Lab Results  Component Value Date   TRIG 93.0 07/06/2016   Lab Results  Component Value Date   CHOLHDL 3 07/06/2016   Lab Results  Component Value Date   HGBA1C 5.7 04/08/2020       Assessment & Plan:   Problem List Items Addressed This Visit   None     No orders of the defined types were placed in this encounter.   I, [name], personally preformed the services described in this documentation.  All medical record entries made by the scribe were at my direction and in my presence.  I have reviewed the chart  and discharge instructions (if applicable) and agree that the record reflects my personal performance and is accurate and complete. 10/23/2021  I,Gabriella Ballesteros,acting as a scribe for Lemont Fillers, NP.,have documented all relevant documentation on the behalf of Lemont Fillers, NP,as directed by  Lemont Fillers, NP while in the presence of Lemont Fillers, NP.      [name]

## 2021-10-24 DIAGNOSIS — R634 Abnormal weight loss: Secondary | ICD-10-CM | POA: Insufficient documentation

## 2021-10-24 LAB — CBC WITH DIFFERENTIAL/PLATELET
Absolute Monocytes: 333 cells/uL (ref 200–950)
Basophils Absolute: 39 cells/uL (ref 0–200)
Basophils Relative: 0.8 %
Eosinophils Absolute: 211 cells/uL (ref 15–500)
Eosinophils Relative: 4.3 %
HCT: 42.3 % (ref 35.0–45.0)
Hemoglobin: 13.8 g/dL (ref 11.7–15.5)
Lymphs Abs: 1299 cells/uL (ref 850–3900)
MCH: 29.1 pg (ref 27.0–33.0)
MCHC: 32.6 g/dL (ref 32.0–36.0)
MCV: 89.1 fL (ref 80.0–100.0)
MPV: 9.4 fL (ref 7.5–12.5)
Monocytes Relative: 6.8 %
Neutro Abs: 3018 cells/uL (ref 1500–7800)
Neutrophils Relative %: 61.6 %
Platelets: 230 10*3/uL (ref 140–400)
RBC: 4.75 10*6/uL (ref 3.80–5.10)
RDW: 12.5 % (ref 11.0–15.0)
Total Lymphocyte: 26.5 %
WBC: 4.9 10*3/uL (ref 3.8–10.8)

## 2021-10-24 LAB — BASIC METABOLIC PANEL
BUN: 16 mg/dL (ref 7–25)
CO2: 27 mmol/L (ref 20–32)
Calcium: 9.5 mg/dL (ref 8.6–10.4)
Chloride: 105 mmol/L (ref 98–110)
Creat: 0.9 mg/dL (ref 0.60–1.00)
Glucose, Bld: 71 mg/dL (ref 65–99)
Potassium: 4.8 mmol/L (ref 3.5–5.3)
Sodium: 141 mmol/L (ref 135–146)

## 2021-10-24 LAB — TSH: TSH: 3.09 mIU/L (ref 0.40–4.50)

## 2021-10-24 NOTE — Assessment & Plan Note (Signed)
Continues ambien prn.  Controlled substance contract is updated today. Check UDS.

## 2021-10-24 NOTE — Assessment & Plan Note (Signed)
Check labs as below.  I suspect her depression is a contributor.

## 2021-10-27 ENCOUNTER — Other Ambulatory Visit: Payer: Self-pay | Admitting: Family

## 2021-10-27 DIAGNOSIS — G4700 Insomnia, unspecified: Secondary | ICD-10-CM

## 2021-10-28 NOTE — Telephone Encounter (Signed)
Requesting: Ambien 5mg   Contract: 04/08/20 UDS: 11/18/20 Last Visit: 10/23/21 Next Visit: None Last Refill: 07/03/21 #30 and 0RF  Please Advise

## 2021-11-12 ENCOUNTER — Ambulatory Visit: Payer: Medicare Other

## 2021-12-22 ENCOUNTER — Telehealth: Payer: Self-pay | Admitting: Family

## 2021-12-22 NOTE — Telephone Encounter (Signed)
Left message for patient to call back and schedule Medicare Annual Wellness Visit (AWV).   Please offer to do virtually or by telephone.  Left office number and my jabber #[phone].  Last AWV:: 11/10/2020  Please schedule at anytime with Nurse Health Advisor.   

## 2021-12-29 ENCOUNTER — Inpatient Hospital Stay (HOSPITAL_BASED_OUTPATIENT_CLINIC_OR_DEPARTMENT_OTHER): Admission: RE | Admit: 2021-12-29 | Payer: Medicare Other | Source: Ambulatory Visit

## 2022-01-08 ENCOUNTER — Other Ambulatory Visit: Payer: Self-pay | Admitting: Family

## 2022-01-08 DIAGNOSIS — G4700 Insomnia, unspecified: Secondary | ICD-10-CM

## 2022-04-08 ENCOUNTER — Other Ambulatory Visit: Payer: Self-pay | Admitting: Family

## 2022-05-03 ENCOUNTER — Encounter: Payer: Self-pay | Admitting: Gastroenterology

## 2022-05-21 ENCOUNTER — Emergency Department (HOSPITAL_BASED_OUTPATIENT_CLINIC_OR_DEPARTMENT_OTHER): Payer: Medicare Other

## 2022-05-21 ENCOUNTER — Encounter (HOSPITAL_BASED_OUTPATIENT_CLINIC_OR_DEPARTMENT_OTHER): Payer: Self-pay | Admitting: Urology

## 2022-05-21 ENCOUNTER — Encounter: Payer: Self-pay | Admitting: Family

## 2022-05-21 ENCOUNTER — Other Ambulatory Visit: Payer: Self-pay

## 2022-05-21 ENCOUNTER — Emergency Department (HOSPITAL_BASED_OUTPATIENT_CLINIC_OR_DEPARTMENT_OTHER)
Admission: EM | Admit: 2022-05-21 | Discharge: 2022-05-21 | Disposition: A | Discharge status: Home / Self Care | Payer: Medicare Other | Attending: Emergency Medicine | Admitting: Emergency Medicine

## 2022-05-21 ENCOUNTER — Ambulatory Visit (INDEPENDENT_AMBULATORY_CARE_PROVIDER_SITE_OTHER): Payer: Medicare Other | Admitting: Family

## 2022-05-21 VITALS — BP 110/72 | HR 76 | Resp 18 | Ht 62.0 in | Wt 175.2 lb

## 2022-05-21 DIAGNOSIS — M419 Scoliosis, unspecified: Secondary | ICD-10-CM | POA: Diagnosis not present

## 2022-05-21 DIAGNOSIS — S0990XA Unspecified injury of head, initial encounter: Secondary | ICD-10-CM | POA: Diagnosis not present

## 2022-05-21 DIAGNOSIS — W19XXXA Unspecified fall, initial encounter: Secondary | ICD-10-CM | POA: Diagnosis not present

## 2022-05-21 DIAGNOSIS — R0781 Pleurodynia: Secondary | ICD-10-CM | POA: Insufficient documentation

## 2022-05-21 DIAGNOSIS — I771 Stricture of artery: Secondary | ICD-10-CM | POA: Diagnosis not present

## 2022-05-21 DIAGNOSIS — R519 Headache, unspecified: Secondary | ICD-10-CM | POA: Diagnosis not present

## 2022-05-21 DIAGNOSIS — Z7982 Long term (current) use of aspirin: Secondary | ICD-10-CM | POA: Diagnosis not present

## 2022-05-21 DIAGNOSIS — J984 Other disorders of lung: Secondary | ICD-10-CM | POA: Diagnosis not present

## 2022-05-21 DIAGNOSIS — I739 Peripheral vascular disease, unspecified: Secondary | ICD-10-CM | POA: Diagnosis not present

## 2022-05-21 MED ORDER — KETOROLAC TROMETHAMINE 60 MG/2ML IM SOLN: 60.0000 mg | Freq: Once | INTRAMUSCULAR | Status: DC

## 2022-05-21 MED ORDER — LIDOCAINE 5 % EX PTCH
1.0000 | MEDICATED_PATCH | CUTANEOUS | Status: DC
  Administered 2022-05-21: 1 via TRANSDERMAL
  Filled 2022-05-21 (×2): qty 1

## 2022-05-21 NOTE — Progress Notes (Signed)
Judy Weiss is a 77 y.o. 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 (Patient taking differently: Take 162 mg by mouth daily. Take 2 tablets by mouth daily.)     buPROPion (WELLBUTRIN XL) 300 MG 24 hr tablet Take 1 tablet (300 mg total) by mouth every morning. 90 tablet 1   citalopram (CELEXA) 40 MG tablet Take 1 tablet (40 mg total) by mouth daily. 90 tablet 1   COVID-19 mRNA bivalent vaccine, Pfizer, (PFIZER COVID-19 VAC BIVALENT) injection Inject into the muscle. 0.3 mL 0   cyanocobalamin 1000 MCG tablet Take 1,000 mcg by mouth daily.     pantoprazole (PROTONIX) 40 MG tablet TAKE 1 TABLET(40 MG) BY MOUTH DAILY 90 tablet 1   vitamin B-12 (CYANOCOBALAMIN) 500 MCG tablet Take 500 mcg by mouth daily.     VITAMIN D PO Take 2,000 Units by mouth daily.     zolpidem (AMBIEN) 5 MG tablet TAKE 1 TABLET BY MOUTH EVERY NIGHT AT BEDTIME AS NEEDED FOR SLEEP 30 tablet 0   Zoster Vaccine Adjuvanted [facility]) injection Inject 0.33ml IM Now and again in 2-67months. 0.5 mL 0   No current facility-administered medications for this visit.    Allergies: Patient has no known allergies.  Past Medical History:  Diagnosis Date   Allergy    food allergies and some additives. But she does not know what will cause.   Depression     GERD (gastroesophageal reflux disease)    had in past but now controlled. Has hiatal hernia.   Heart murmur    History of blood clots 1996?   left leg   History of colon polyps    History of hiatal hernia    Hyperlipidemia     Past Surgical History:  Procedure Laterality Date   APPENDECTOMY  1972   DILATION AND CURETTAGE OF UTERUS     HIATAL HERNIA REPAIR  2016   INSERTION OF MESH N/A 01/09/2016   Procedure: INSERTION OF MESH;  Surgeon: Ralene Ok, MD;  Location: [facility];  Service: General;  Laterality: N/A;   KNEE SURGERY Bilateral    hx of torn meniscus (arthroscopic surgery)   LEG SURGERY Left 2001   Had veins stripped    SHOULDER SURGERY Right    reports hx arthroscopic surgery    Family History  Problem Relation Age of Onset   Diabetes Mother    Hypothyroidism Mother    Seizures Mother     Social History   Tobacco Use   Smoking status: Never   Smokeless tobacco: Never  Substance Use Topics   Alcohol use: Yes    Comment: 2 cans "hard cider"/day    Subjective:   Patient notes that she was taking her garbage can to the curb around 2 am on Wednesday morning; she fell while taking the can and hit her head on  the concrete edge of her driveway; she does not remember anything that caused her to fall- she "just fell." She notes that for about 30 minutes after she got back into her house she did not have any vision in her right eye; she became concerned that her headache has worsened in the past 24 hours; she is also concerned about pain in her right rib cage/ very sore to the touch;   Objective:  Vitals:   05/21/22 1457  BP: 110/72  Pulse: 76  Resp: 18  SpO2: 98%  Weight: 175 lb 3.2 oz (79.5 kg)  Height: 5\' 2"  (1.575 m)    General: Well developed, well nourished, in no acute distress  Skin : Warm and dry. Bruising noted around right eye;  Head: Normocephalic and atraumatic  Lungs: Respirations unlabored; clear to auscultation bilaterally without wheeze, rales,  rhonchi  CVS exam: normal rate and regular rhythm.  Neurologic: Alert and oriented; speech intact; face symmetrical; moves all extremities well; CNII-XII intact without  focal deficit   Assessment:  1. Traumatic injury of head, initial encounter   2. Rib pain on right side     Plan:  Due to presenting symptoms and unexplained fall/ head injury, feel patient needs to be seen in ER; she is seen in the office late on Friday afternoon and unable to get imaging of her brain at this time;  Patient is in agreement and is taken to the ER by one of our staff members- communicated to ER provider patient's history of fall/ head injury;   No follow-ups on file.  No orders of the defined types were placed in this encounter.   Requested Prescriptions    No prescriptions requested or ordered in this encounter

## 2022-05-21 NOTE — Discharge Instructions (Addendum)
Use the incentive spirometer 10 breaths every hour while awake Use your Tylenol and Ibuprofen around the clock as instructed for the next 5 days

## 2022-05-21 NOTE — ED Notes (Signed)
Pt given an incentive spirometer and was able to demonstrate proper use.

## 2022-05-21 NOTE — ED Triage Notes (Addendum)
Pt states fell at 0200 Wednesday morning  C/o right sided rib pain and headache  Bruising under right eye, states had vision loss at time of fall for approx 1 min in right eye but has resolved  Does NOT take thinners

## 2022-05-21 NOTE — ED Provider Notes (Signed)
[facility] Provider Note   CSN: [id number] Arrival date & time: 05/21/22  1530     History  Chief Complaint  Patient presents with   Lytle Michaels    Judy Weiss is a 77 y.o. female.  Patient is presenting for a fall that occurred at 0200 on Wednesday morning, landing on the right side with right sided rib pain and associated frontal HA.  She had some bruising under the right eye with vision loss at the time of the fall for approximately 1 minute but that has resolved.  Patient is not on blood thinners, takes bbASA.  Patient reports that she did not trip and fall but has been having difficulty with balance as of late, relates the fall to this.  Patient reports no loss of consciousness prior to or after the fall.  She was walking her trash can out to the curb when the fall occurred. Patient denies dyspnea, chest pain. Endorses significant pain the right side over the ribs.   The history is provided by the patient.  Fall Pertinent negatives include no chest pain, no abdominal pain and no shortness of breath.       Home Medications Prior to Admission medications   Medication Sig Start Date End Date Taking? Authorizing Provider  aspirin EC 81 MG tablet Take 1 tablet (81 mg total) by mouth daily. Patient taking differently: Take 162 mg by mouth daily. Take 2 tablets by mouth daily. 05/02/15   Debbrah Alar, NP  buPROPion (WELLBUTRIN XL) 300 MG 24 hr tablet Take 1 tablet (300 mg total) by mouth every morning. 10/23/21   Debbrah Alar, NP  citalopram (CELEXA) 40 MG tablet Take 1 tablet (40 mg total) by mouth daily. 10/23/21   Debbrah Alar, NP  COVID-19 mRNA bivalent vaccine, Pfizer, (PFIZER COVID-19 VAC BIVALENT) injection Inject into the muscle. 04/16/21   Carlyle Basques, MD  cyanocobalamin 1000 MCG tablet Take 1,000 mcg by mouth daily.    [provider]  pantoprazole (PROTONIX) 40 MG tablet TAKE 1 TABLET(40 MG) BY MOUTH  DAILY 04/08/22   Debbrah Alar, NP  vitamin B-12 (CYANOCOBALAMIN) 500 MCG tablet Take 500 mcg by mouth daily.    [provider]  VITAMIN D PO Take 2,000 Units by mouth daily.    [provider]  zolpidem (AMBIEN) 5 MG tablet TAKE 1 TABLET BY MOUTH EVERY NIGHT AT BEDTIME AS NEEDED FOR SLEEP 01/08/22 07/07/22  Debbrah Alar, NP  Zoster Vaccine Adjuvanted [facility]) injection Inject 0.68ml IM Now and again in 2-74months. 10/23/21   Debbrah Alar, NP      Allergies    Patient has no known allergies.    Review of Systems   Review of Systems  Constitutional:  Negative for chills and fever.  HENT:  Negative for ear pain and sore throat.   Eyes:  Negative for pain and visual disturbance.  Respiratory:  Negative for cough and shortness of breath.   Cardiovascular:  Negative for chest pain and palpitations.  Gastrointestinal:  Negative for abdominal pain and vomiting.  Genitourinary:  Negative for dysuria and hematuria.  Musculoskeletal:  Negative for arthralgias, back pain, gait problem and neck pain.       TTP over right ribs   Skin:  Negative for color change and rash.  Neurological:  Negative for seizures and syncope.  All other systems reviewed and are negative.   Physical Exam Updated Vital Signs BP 133/72   Pulse 67   Temp 98.2  F (36.8 C) (Oral)   Resp 16   Ht 5\' 2"  (1.575 m)   Wt 79.5 kg   SpO2 100%   BMI 32.06 kg/m  Physical Exam Vitals reviewed.  Constitutional:      Appearance: Normal appearance.  HENT:     Head: Normocephalic.     Nose: Nose normal.     Mouth/Throat:     Mouth: Mucous membranes are moist.  Eyes:     Conjunctiva/sclera: Conjunctivae normal.  Cardiovascular:     Rate and Rhythm: Normal rate and regular rhythm.  Pulmonary:     Effort: Pulmonary effort is normal.     Breath sounds: Normal breath sounds.  Abdominal:     General: Abdomen is flat.     Palpations: Abdomen is soft.     Comments: Right sided rib point  TTP   Musculoskeletal:        General: Normal range of motion.     Cervical back: Normal range of motion.  Skin:    Capillary Refill: Capillary refill takes less than 2 seconds.  Neurological:     General: No focal deficit present.     Mental Status: She is alert. Mental status is at baseline.     Cranial Nerves: No cranial nerve deficit.     Sensory: No sensory deficit.     Motor: No weakness.  Psychiatric:        Mood and Affect: Mood normal.        Behavior: Behavior normal.    ED Results / Procedures / Treatments   Labs (all labs ordered are listed, but only abnormal results are displayed) Labs Reviewed - No data to display  EKG None  Radiology DG Ribs Unilateral W/Chest Right  Result Date: 05/21/2022 CLINICAL DATA:  Fall, right-sided rib pain EXAM: RIGHT RIBS AND CHEST - 3+ VIEW COMPARISON:  Chest radiograph 12/13/2016. FINDINGS: Three views. No displaced fracture or other bone lesions are seen involving the ribs. There is no evidence of pneumothorax or pleural effusion. Linear scarring in the lower lungs. Otherwise, no focal airspace opacity. Stable cardiac and mediastinal contours with tortuosity of the thoracic aorta accentuated by marked scoliotic curvature of the lower thoracic and upper lumbar spine. IMPRESSION: No displaced fracture of the right ribs. Electronically Signed   By: Emmit Alexanders M.D.   On: 05/21/2022 16:48   CT Head Wo Contrast  Result Date: 05/21/2022 CLINICAL DATA:  Head trauma. EXAM: CT HEAD WITHOUT CONTRAST TECHNIQUE: Contiguous axial images were obtained from the base of the skull through the vertex without intravenous contrast. RADIATION DOSE REDUCTION: This exam was performed according to the departmental dose-optimization program which includes automated exposure control, adjustment of the mA and/or kV according to patient size and/or use of iterative reconstruction technique. COMPARISON:  Head CT 12/13/2016.  MRI brain 09/28/2017. FINDINGS: Brain: No  acute intracranial hemorrhage. Moderate chronic small-vessel disease, progressed from 2018. Gray-white differentiation is otherwise preserved. No hydrocephalus or extra-axial collection. Basilar cisterns are patent. Vascular: No hyperdense vessel or unexpected calcification. Skull: No calvarial fracture or suspicious bone lesion. Skull base is unremarkable. Sinuses/Orbits: Trace opacification of the right maxillary and left sphenoid sinuses. Mastoids are well aerated. Orbits are unremarkable. Other: None. IMPRESSION: 1. No evidence of acute intracranial injury. 2. Moderate chronic small-vessel disease, progressed from 2018. Electronically Signed   By: Emmit Alexanders M.D.   On: 05/21/2022 16:28    Procedures Procedures    Medications Ordered in ED Medications  lidocaine (LIDODERM) 5 % 1  patch (has no administration in time range)    ED Course/ Medical Decision Making/ A&P                             Medical Decision Making Patient presenting status post fall with right-sided rib pain.  Right rib and chest x-rays unremarkable with CT head negative without orbital fracture or hemorrhage.  Patient denies loss of consciousness or confusion after the fall.  She has good lung sounds and has normal aeration.  Given length of time since the fall and ability to control pain outpatient, discussed continuing with incentive spirometer and Tylenol with ibuprofen for analgesia.  Discussed importance of incentive spirometry to avoid complications of possible rib fracture/contusion including pneumonia, atelectasis.  Patient verbalized understanding.  Will provide the patient with the incentive spirometer to take home.  Additionally, follow-up with PCP in the next couple of weeks for further evaluation.  Strict return precautions provided.  Amount and/or Complexity of Data Reviewed Radiology: ordered.    Details: X-ray ribs unilateral with chest on the right unremarkable without evidence of displaced fracture or  bone lesion CT head without contrast shows no sign of hemorrhage, orbits in place, no evidence of skull fracture  Risk Prescription drug management.          Final Clinical Impression(s) / ED Diagnoses Final diagnoses:  Fall, initial encounter    Rx / DC Orders ED Discharge Orders     None         Erskine Emery, MD 05/21/22 3299    Gareth Morgan, MD 05/27/22 [phone]

## 2022-05-21 NOTE — ED Notes (Signed)
Pt verbalized understanding of d/c instructions and follow up care.

## 2022-05-29 ENCOUNTER — Other Ambulatory Visit: Payer: Self-pay | Admitting: Family

## 2022-05-29 DIAGNOSIS — G4700 Insomnia, unspecified: Secondary | ICD-10-CM

## 2022-06-08 ENCOUNTER — Ambulatory Visit: Payer: Medicare Other | Admitting: Gastroenterology

## 2022-06-11 ENCOUNTER — Telehealth: Payer: Self-pay | Admitting: Family

## 2022-06-11 NOTE — Telephone Encounter (Signed)
Contacted Judy Weiss to schedule their annual wellness visit. Appointment made for 06/18/2022.  Sherol Dade; Care Guide Amb[facility] Direct Dial: [phone]

## 2022-06-18 ENCOUNTER — Ambulatory Visit (INDEPENDENT_AMBULATORY_CARE_PROVIDER_SITE_OTHER): Payer: Medicare Other | Admitting: *Deleted

## 2022-06-18 DIAGNOSIS — G4700 Insomnia, unspecified: Secondary | ICD-10-CM

## 2022-06-18 DIAGNOSIS — Z0000 Encounter for general adult medical examination without abnormal findings: Secondary | ICD-10-CM

## 2022-06-18 NOTE — Progress Notes (Signed)
Subjective:   Judy Weiss is a 77 y.o. female who presents for Medicare Annual (Subsequent) preventive examination.  I connected with  Kerin Salen on 06/18/22 by a audio enabled telemedicine application and verified that I am speaking with the correct person using two identifiers.  Patient Location: Home  Provider Location: Office/Clinic  I discussed the limitations of evaluation and management by telemedicine. The patient expressed understanding and agreed to proceed.   Review of Systems     Cardiac Risk Factors include: advanced age (>49mn, >>51women);dyslipidemia     Objective:    There were no vitals filed for this visit. There is no height or weight on file to calculate BMI.     06/18/2022    2:19 PM 05/21/2022    3:40 PM 11/10/2020    2:27 PM 07/27/2019    9:06 PM 10/09/2018    3:09 PM 10/06/2017    4:05 PM 12/13/2016    3:22 PM  Advanced Directives  Does Patient Have a Medical Advance Directive? Yes No No No No No No  Type of Advance Directive HColemanin Chart? No - copy requested        Would patient like information on creating a medical advance directive?   No - Patient declined  No - Patient declined Yes (MAU/Ambulatory/Procedural Areas - Information given)     Current Medications (verified) Outpatient Encounter Medications as of 06/18/2022  Medication Sig   aspirin EC 81 MG tablet Take 1 tablet (81 mg total) by mouth daily. (Patient taking differently: Take 162 mg by mouth daily. Take 2 tablets by mouth daily.)   buPROPion (WELLBUTRIN XL) 300 MG 24 hr tablet Take 1 tablet (300 mg total) by mouth every morning.   citalopram (CELEXA) 40 MG tablet Take 1 tablet (40 mg total) by mouth daily.   COVID-19 mRNA bivalent vaccine, Pfizer, (PFIZER COVID-19 VAC BIVALENT) injection Inject into the muscle.   cyanocobalamin 1000 MCG tablet Take 1,000 mcg by mouth daily.   pantoprazole (PROTONIX) 40 MG tablet TAKE  1 TABLET(40 MG) BY MOUTH DAILY   vitamin B-12 (CYANOCOBALAMIN) 500 MCG tablet Take 500 mcg by mouth daily.   VITAMIN D PO Take 2,000 Units by mouth daily.   zolpidem (AMBIEN) 5 MG tablet TAKE 1 TABLET BY MOUTH EVERY NIGHT AT BEDTIME AS NEEDED FOR SLEEP   Zoster Vaccine Adjuvanted ([facility] injection Inject 0.55mIM Now and again in 2-69m28month  No facility-administered encounter medications on file as of 06/18/2022.    Allergies (verified) Patient has no known allergies.   History: Past Medical History:  Diagnosis Date   Allergy    food allergies and some additives. But she does not know what will cause.   Depression    GERD (gastroesophageal reflux disease)    had in past but now controlled. Has hiatal hernia.   Heart murmur    History of blood clots 1996?   left leg   History of colon polyps    History of hiatal hernia    Hyperlipidemia    Past Surgical History:  Procedure Laterality Date   APPENDECTOMY  1972   DILATION AND CURETTAGE OF UTERUS     HIATAL HERNIA REPAIR  2016   INSERTION OF MESH N/A 01/09/2016   Procedure: INSERTION OF MESH;  Surgeon: ArmRalene OkD;  Location: [facility];  Service: General;  Laterality: N/A;   KNEE SURGERY Bilateral  hx of torn meniscus (arthroscopic surgery)   LEG SURGERY Left 2001   Had veins stripped    SHOULDER SURGERY Right    reports hx arthroscopic surgery   Family History  Problem Relation Age of Onset   Diabetes Mother    Hypothyroidism Mother    Seizures Mother    Social History   Socioeconomic History   Marital status: Divorced    Spouse name: Not on file   Number of children: Not on file   Years of education: Not on file   Highest education level: Not on file  Occupational History   Not on file  Tobacco Use   Smoking status: Never   Smokeless tobacco: Never  Vaping Use   Vaping Use: Never used  Substance and Sexual Activity   Alcohol use: Yes    Comment: 2 cans "hard cider"/day   Drug use: No   Sexual  activity: Not on file  Other Topics Concern   Not on file  Social History Narrative   Lives alone- can walk to her daughter's home   Daughter in [place]   Son in [place]   Son [place]   4 grandchildren (twin girls age 35)   Retired- Programmer, 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   Divorced   4 cats and a dog   Enjoys antiquing.  Buys and sells.  Enjoys shopping   Social Determinants of Health   Financial Resource Strain: Low Risk  (11/10/2020)   Overall Financial Resource Strain (CARDIA)    Difficulty of Paying Living Expenses: Not hard at all  Food Insecurity: No Food Insecurity (06/18/2022)   Hunger Vital Sign    Worried About Running Out of Food in the Last Year: Never true    Ran Out of Food in the Last Year: Never true  Transportation Needs: No Transportation Needs (06/18/2022)   PRAPARE - Hydrologist (Medical): No    Lack of Transportation (Non-Medical): No  Physical Activity: Inactive (11/10/2020)   Exercise Vital Sign    Days of Exercise per Week: 0 days    Minutes of Exercise per Session: 0 min  Stress: No Stress Concern Present (11/10/2020)   [place]    Feeling of Stress : Not at all  Social Connections: Socially Isolated (11/10/2020)   Social Connection and Isolation Panel [NHANES]    Frequency of Communication with Friends and Family: More than three times a week    Frequency of Social Gatherings with Friends and Family: More than three times a week    Attends Religious Services: Never    Marine scientist or Organizations: No    Attends Music therapist: Never    Marital Status: Divorced    Tobacco Counseling Counseling given: Not Answered   Clinical Intake:  Pre-visit preparation completed: Yes  Pain : No/denies pain  Diabetes: No  How often do you need to have someone help you when you read instructions, pamphlets, or other  written materials from your doctor or pharmacy?: 1 - Never   Activities of Daily Living    06/18/2022    2:24 PM  In your present state of health, do you have any difficulty performing the following activities:  Hearing? 1  Vision? 0  Difficulty concentrating or making decisions? 1  Comment memory  Walking or climbing stairs? 1  Dressing or bathing? 0  Doing errands, shopping? 0  Preparing Food and eating ? N  Using the Toilet? N  In the past six months, have you accidently leaked urine? Y  Do you have problems with loss of bowel control? N  Managing your Medications? N  Managing your Finances? N  Housekeeping or managing your Housekeeping? N    Patient Care Team: Debbrah Alar, NP as PCP - General (Internal Medicine) Cameron Sprang, MD as Consulting Physician (Neurology)  Indicate any recent Medical Services you may have received from other than [facility] providers in the past year (date may be approximate).     Assessment:   This is a routine wellness examination for Judy Weiss.  Hearing/Vision screen No results found.  Dietary issues and exercise activities discussed: Current Exercise Habits: The patient does not participate in regular exercise at present, Exercise limited by: None identified   Goals Addressed   None    Depression Screen    06/18/2022    2:23 PM 05/21/2022    3:07 PM 10/23/2021    3:51 PM 11/10/2020    2:30 PM 04/08/2020    3:47 PM 10/09/2018    3:10 PM 10/06/2017    4:07 PM  PHQ 2/9 Scores  PHQ - 2 Score 3 0 5 0 2 0 1  PHQ- 9 Score   13  7      Fall Risk    06/18/2022    2:20 PM 05/21/2022    3:06 PM 11/10/2020    2:29 PM 04/08/2020    3:09 PM 10/31/2018    2:47 PM  Fall Risk   Falls in the past year? '1 1 1 1 '$ 0  Number falls in past yr: '1 1 1 1 '$ 0  Injury with Fall? '1 1 1 '$ 0 0  Risk for fall due to : History of fall(s);Impaired balance/gait History of fall(s) History of fall(s);Impaired balance/gait    Follow up Falls evaluation completed  Falls  prevention discussed  Falls evaluation completed    FALL RISK PREVENTION PERTAINING TO THE HOME:  Any stairs in or around the home? Yes  If so, are there any without handrails? No  Home free of loose throw rugs in walkways, pet beds, electrical cords, etc? Yes  Adequate lighting in your home to reduce risk of falls? Yes   ASSISTIVE DEVICES UTILIZED TO PREVENT FALLS:  Life alert?  Wears Apple watch Use of a cane, walker or w/c? No  Grab bars in the bathroom? Yes  Shower chair or bench in shower? Yes  Elevated toilet seat or a handicapped toilet?  Comfort height  TIMED UP AND GO:  Was the test performed?  No, audio visit .    Cognitive Function:    10/06/2017    4:08 PM 08/24/2017   11:00 AM  MMSE - Mini Mental State Exam  Orientation to time 5 5  Orientation to Place 5 5  Registration 3 3  Attention/ Calculation 5 4  Recall 3 2  Language- name 2 objects 2 2  Language- repeat 1 1  Language- follow 3 step command 3 3  Language- read & follow direction 1 1  Write a sentence 1 1  Copy design 1 1  Total score 30 28      10/31/2018    2:00 PM 03/25/2017   11:00 AM  Montreal Cognitive Assessment   Visuospatial/ Executive (0/5) 5 5  Naming (0/3) 3 3  Attention: Read list of digits (0/2) 2 2  Attention: Read list of letters (0/1) 1 1  Attention: Serial 7 subtraction starting at 100 (0/3) 2 3  Language: Repeat phrase (0/2) 2 2  Language : Fluency (0/1) 0 0  Abstraction (0/2) 2 2  Delayed Recall (0/5) 4 4  Orientation (0/6) 6 6  Total 27 28      06/18/2022    2:28 PM 11/10/2020    2:39 PM  6CIT Screen  What Year? 0 points 0 points  What month? 0 points 0 points  What time? 0 points 0 points  Count back from 20 0 points 0 points  Months in reverse 0 points 0 points  Repeat phrase 0 points 2 points  Total Score 0 points 2 points    Immunizations Immunization History  Administered Date(s) Administered   Fluad Quad(high Dose 65+) 04/08/2020, 02/20/2021    Influenza, High Dose Seasonal PF 07/04/2018   PFIZER(Purple Top)SARS-COV-2 Vaccination 05/10/2019, 05/31/2019, 02/06/2020, 11/03/2020   Pfizer Covid-19 Vaccine Bivalent Booster 52yr & up 04/16/2021   Pneumococcal Conjugate-13 07/04/2018   Pneumococcal Polysaccharide-23 02/20/2021    TDAP status: Due, Education has been provided regarding the importance of this vaccine. Advised may receive this vaccine at local pharmacy or Health Dept. Aware to provide a copy of the vaccination record if obtained from local pharmacy or Health Dept. Verbalized acceptance and understanding.  Flu Vaccine status: Due, Education has been provided regarding the importance of this vaccine. Advised may receive this vaccine at local pharmacy or Health Dept. Aware to provide a copy of the vaccination record if obtained from local pharmacy or Health Dept. Verbalized acceptance and understanding.  Pneumococcal vaccine status: Up to date  Covid-19 vaccine status: Information provided on how to obtain vaccines.   Qualifies for Shingles Vaccine? Yes   Zostavax completed No   Shingrix Completed?: No.    Education has been provided regarding the importance of this vaccine. Patient has been advised to call insurance company to determine out of pocket expense if they have not yet received this vaccine. Advised may also receive vaccine at local pharmacy or Health Dept. Verbalized acceptance and understanding.  Screening Tests Health Maintenance  Topic Date Due   Hepatitis C Screening  Never done   DTaP/Tdap/Td (1 - Tdap) Never done   Zoster Vaccines- Shingrix (1 of 2) Never done   Medicare Annual Wellness (AWV)  11/10/2021   COVID-19 Vaccine (6 - 2023-24 season) 12/18/2021   INFLUENZA VACCINE  07/18/2022 (Originally 11/17/2021)   Pneumonia Vaccine 79 Years old  Completed   DEXA SCAN  Completed   HPV VACCINES  Aged Out   COLONOSCOPY (Pts 45-440yrInsurance coverage will need to be confirmed)  Discontinued    Health  Maintenance  Health Maintenance Due  Topic Date Due   Hepatitis C Screening  Never done   DTaP/Tdap/Td (1 - Tdap) Never done   Zoster Vaccines- Shingrix (1 of 2) Never done   Medicare Annual Wellness (AWV)  11/10/2021   COVID-19 Vaccine (6 - 2023-24 season) 12/18/2021    Colorectal cancer screening: No longer required.   Mammogram status: Completed 12/23/20. Repeat every year  Bone Density status: Completed 12/23/20. Results reflect: Bone density results: OSTEOPENIA. Repeat every 2 years.  Lung Cancer Screening: (Low Dose CT Chest recommended if Age 717-80ears, 30 pack-year currently smoking OR have quit w/in 15years.) does not qualify.   Additional Screening:  Hepatitis C Screening: does qualify; Completed N/a  Vision Screening: Recommended annual ophthalmology exams for early detection of glaucoma and other disorders of the eye. Is the patient up to  date with their annual eye exam?  No  Who is the provider or what is the name of the office in which the patient attends annual eye exams? Transferring to new eye doctor.  Hasn't scheduled appointment yet. If pt is not established with a provider, would they like to be referred to a provider to establish care? No .   Dental Screening: Recommended annual dental exams for proper oral hygiene  Community Resource Referral / Chronic Care Management: CRR required this visit?  No   CCM required this visit?  No      Plan:     I have personally reviewed and noted the following in the patient's chart:   Medical and social history Use of alcohol, tobacco or illicit drugs  Current medications and supplements including opioid prescriptions. Patient is not currently taking opioid prescriptions. Functional ability and status Nutritional status Physical activity Advanced directives List of other physicians Hospitalizations, surgeries, and ER visits in previous 12 months Vitals Screenings to include cognitive, depression, and  falls Referrals and appointments  In addition, I have reviewed and discussed with patient certain preventive protocols, quality metrics, and best practice recommendations. A written personalized care plan for preventive services as well as general preventive health recommendations were provided to patient.   Due to this being a telephonic visit, the after visit summary with patients personalized plan was offered to patient via mail or my-chart. Patient would like to access on my-chart.  Beatris Ship, [place]   06/18/2022   Nurse Notes: None

## 2022-06-18 NOTE — Patient Instructions (Signed)
Ms. Spell , Thank you for taking time to come for your Medicare Wellness Visit. I appreciate your ongoing commitment to your health goals. Please review the following plan we discussed and let me know if I can assist you in the future.     This is a list of the screening recommended for you and due dates:  Health Maintenance  Topic Date Due   Hepatitis C Screening: USPSTF Recommendation to screen - Ages 24-79 yo.  Never done   DTaP/Tdap/Td vaccine (1 - Tdap) Never done   Zoster (Shingles) Vaccine (1 of 2) Never done   COVID-19 Vaccine (6 - 2023-24 season) 12/18/2021   Flu Shot  07/18/2022*   Medicare Annual Wellness Visit  06/18/2023   Pneumonia Vaccine  Completed   DEXA scan (bone density measurement)  Completed   HPV Vaccine  Aged Out   Colon Cancer Screening  Discontinued  *Topic was postponed. The date shown is not the original due date.     Next appointment: Follow up in one year for your annual wellness visit.   Preventive Care 41 Years and Older, Female Preventive care refers to lifestyle choices and visits with your health care provider that can promote health and wellness. What does preventive care include? A yearly physical exam. This is also called an annual well check. Dental exams once or twice a year. Routine eye exams. Ask your health care provider how often you should have your eyes checked. Personal lifestyle choices, including: Daily care of your teeth and gums. Regular physical activity. Eating a healthy diet. Avoiding tobacco and drug use. Limiting alcohol use. Practicing safe sex. Taking low-dose aspirin every day. Taking vitamin and mineral supplements as recommended by your health care provider. What happens during an annual well check? The services and screenings done by your health care provider during your annual well check will depend on your age, overall health, lifestyle risk factors, and family history of disease. Counseling  Your health care  provider may ask you questions about your: Alcohol use. Tobacco use. Drug use. Emotional well-being. Home and relationship well-being. Sexual activity. Eating habits. History of falls. Memory and ability to understand (cognition). Work and work Statistician. Reproductive health. Screening  You may have the following tests or measurements: Height, weight, and BMI. Blood pressure. Lipid and cholesterol levels. These may be checked every 5 years, or more frequently if you are over 69 years old. Skin check. Lung cancer screening. You may have this screening every year starting at age 49 if you have a 30-pack-year history of smoking and currently smoke or have quit within the past 15 years. Fecal occult blood test (FOBT) of the stool. You may have this test every year starting at age 34. Flexible sigmoidoscopy or colonoscopy. You may have a sigmoidoscopy every 5 years or a colonoscopy every 10 years starting at age 25. Hepatitis C blood test. Hepatitis B blood test. Sexually transmitted disease (STD) testing. Diabetes screening. This is done by checking your blood sugar (glucose) after you have not eaten for a while (fasting). You may have this done every 1-3 years. Bone density scan. This is done to screen for osteoporosis. You may have this done starting at age 77. Mammogram. This may be done every 1-2 years. Talk to your health care provider about how often you should have regular mammograms. Talk with your health care provider about your test results, treatment options, and if necessary, the need for more tests. Vaccines  Your health care provider may  recommend certain vaccines, such as: Influenza vaccine. This is recommended every year. Tetanus, diphtheria, and acellular pertussis (Tdap, Td) vaccine. You may need a Td booster every 10 years. Zoster vaccine. You may need this after age 46. Pneumococcal 13-valent conjugate (PCV13) vaccine. One dose is recommended after age  51. Pneumococcal polysaccharide (PPSV23) vaccine. One dose is recommended after age 65. Talk to your health care provider about which screenings and vaccines you need and how often you need them. This information is not intended to replace advice given to you by your health care provider. Make sure you discuss any questions you have with your health care provider. Document Released: 05/02/2015 Document Revised: 12/24/2015 Document Reviewed: 02/04/2015 Elsevier Interactive Patient Education  2017 [place] Prevention in the Home Falls can cause injuries. They can happen to people of all ages. There are many things you can do to make your home safe and to help prevent falls. What can I do on the outside of my home? Regularly fix the edges of walkways and driveways and fix any cracks. Remove anything that might make you trip as you walk through a door, such as a raised step or threshold. Trim any bushes or trees on the path to your home. Use bright outdoor lighting. Clear any walking paths of anything that might make someone trip, such as rocks or tools. Regularly check to see if handrails are loose or broken. Make sure that both sides of any steps have handrails. Any raised decks and porches should have guardrails on the edges. Have any leaves, snow, or ice cleared regularly. Use sand or salt on walking paths during winter. Clean up any spills in your garage right away. This includes oil or grease spills. What can I do in the bathroom? Use night lights. Install grab bars by the toilet and in the tub and shower. Do not use towel bars as grab bars. Use non-skid mats or decals in the tub or shower. If you need to sit down in the shower, use a plastic, non-slip stool. Keep the floor dry. Clean up any water that spills on the floor as soon as it happens. Remove soap buildup in the tub or shower regularly. Attach bath mats securely with double-sided non-slip rug tape. Do not have throw  rugs and other things on the floor that can make you trip. What can I do in the bedroom? Use night lights. Make sure that you have a light by your bed that is easy to reach. Do not use any sheets or blankets that are too big for your bed. They should not hang down onto the floor. Have a firm chair that has side arms. You can use this for support while you get dressed. Do not have throw rugs and other things on the floor that can make you trip. What can I do in the kitchen? Clean up any spills right away. Avoid walking on wet floors. Keep items that you use a lot in easy-to-reach places. If you need to reach something above you, use a strong step stool that has a grab bar. Keep electrical cords out of the way. Do not use floor polish or wax that makes floors slippery. If you must use wax, use non-skid floor wax. Do not have throw rugs and other things on the floor that can make you trip. What can I do with my stairs? Do not leave any items on the stairs. Make sure that there are handrails on both sides of  the stairs and use them. Fix handrails that are broken or loose. Make sure that handrails are as long as the stairways. Check any carpeting to make sure that it is firmly attached to the stairs. Fix any carpet that is loose or worn. Avoid having throw rugs at the top or bottom of the stairs. If you do have throw rugs, attach them to the floor with carpet tape. Make sure that you have a light switch at the top of the stairs and the bottom of the stairs. If you do not have them, ask someone to add them for you. What else can I do to help prevent falls? Wear shoes that: Do not have high heels. Have rubber bottoms. Are comfortable and fit you well. Are closed at the toe. Do not wear sandals. If you use a stepladder: Make sure that it is fully opened. Do not climb a closed stepladder. Make sure that both sides of the stepladder are locked into place. Ask someone to hold it for you, if  possible. Clearly mark and make sure that you can see: Any grab bars or handrails. First and last steps. Where the edge of each step is. Use tools that help you move around (mobility aids) if they are needed. These include: Canes. Walkers. Scooters. Crutches. Turn on the lights when you go into a dark area. Replace any light bulbs as soon as they burn out. Set up your furniture so you have a clear path. Avoid moving your furniture around. If any of your floors are uneven, fix them. If there are any pets around you, be aware of where they are. Review your medicines with your doctor. Some medicines can make you feel dizzy. This can increase your chance of falling. Ask your doctor what other things that you can do to help prevent falls. This information is not intended to replace advice given to you by your health care provider. Make sure you discuss any questions you have with your health care provider. Document Released: 01/30/2009 Document Revised: 09/11/2015 Document Reviewed: 05/10/2014 Elsevier Interactive Patient Education  2017 [employer].

## 2022-06-23 ENCOUNTER — Other Ambulatory Visit: Payer: Medicare Other

## 2022-06-23 DIAGNOSIS — G4700 Insomnia, unspecified: Secondary | ICD-10-CM

## 2022-06-23 DIAGNOSIS — Z0000 Encounter for general adult medical examination without abnormal findings: Secondary | ICD-10-CM

## 2022-06-23 NOTE — Progress Notes (Signed)
Patient unable to provide urine specimen and will return for recollect.

## 2022-06-23 NOTE — Addendum Note (Signed)
Addended by: Manuela Schwartz on: 06/23/2022 03:04 PM   Modules accepted: Orders

## 2022-06-23 NOTE — Addendum Note (Signed)
Addended by: Manuela Schwartz on: 06/23/2022 03:15 PM   Modules accepted: Orders

## 2022-07-15 ENCOUNTER — Ambulatory Visit (INDEPENDENT_AMBULATORY_CARE_PROVIDER_SITE_OTHER): Payer: Medicare Other | Admitting: Family

## 2022-07-15 ENCOUNTER — Telehealth: Payer: Self-pay | Admitting: Family

## 2022-07-15 ENCOUNTER — Ambulatory Visit (HOSPITAL_BASED_OUTPATIENT_CLINIC_OR_DEPARTMENT_OTHER)
Admission: RE | Admit: 2022-07-15 | Discharge: 2022-07-15 | Disposition: A | Discharge status: Home / Self Care | Payer: Medicare Other | Source: Ambulatory Visit | Attending: Family | Admitting: Family

## 2022-07-15 VITALS — BP 114/68 | HR 72 | Temp 97.7°F | Resp 16 | Wt 170.0 lb

## 2022-07-15 DIAGNOSIS — Z0000 Encounter for general adult medical examination without abnormal findings: Secondary | ICD-10-CM | POA: Diagnosis not present

## 2022-07-15 DIAGNOSIS — L819 Disorder of pigmentation, unspecified: Secondary | ICD-10-CM | POA: Insufficient documentation

## 2022-07-15 DIAGNOSIS — G4700 Insomnia, unspecified: Secondary | ICD-10-CM | POA: Diagnosis not present

## 2022-07-15 DIAGNOSIS — R634 Abnormal weight loss: Secondary | ICD-10-CM

## 2022-07-15 DIAGNOSIS — I82432 Acute embolism and thrombosis of left popliteal vein: Secondary | ICD-10-CM | POA: Diagnosis not present

## 2022-07-15 DIAGNOSIS — H8111 Benign paroxysmal vertigo, right ear: Secondary | ICD-10-CM

## 2022-07-15 DIAGNOSIS — Z79899 Other long term (current) drug therapy: Secondary | ICD-10-CM | POA: Diagnosis not present

## 2022-07-15 MED ORDER — RIVAROXABAN (XARELTO) VTE STARTER PACK (15 & 20 MG): ORAL_TABLET | ORAL | 0 refills | Status: DC

## 2022-07-15 NOTE — Assessment & Plan Note (Addendum)
New. Will obtain LE doppler this evening to rule out DVT. Will request urgent visit with vascular surgeon. Pt advised to go to ER if symptoms worsen. Case/plan discussed with Dr. Charlett Blake.

## 2022-07-15 NOTE — Progress Notes (Signed)
Subjective:     Patient ID: Judy Weiss, female    DOB: 10-Sep-1945, 77 y.o.   MRN: [id number]  Chief Complaint  Patient presents with   Weight Loss    Patient reports having weight loss    Urine discoloration    Patient complains of having darker urine   Numbness    Patient reports numbness of big toe on left foot    HPI Patient is in today to discuss weight loss. She as lost 5 pounds in the last 7 weeks.  Weight was 182 in July.  She thinks that her appetite seems to be less since christmas.   Wt Readings from Last 3 Encounters:  07/15/22 170 lb (77.1 kg)  05/21/22 175 lb 4.3 oz (79.5 kg)  05/21/22 175 lb 3.2 oz (79.5 kg)     Health Maintenance Due  Topic Date Due   Hepatitis C Screening  Never done   DTaP/Tdap/Td (1 - Tdap) Never done   Zoster Vaccines- Shingrix (1 of 2) Never done   COVID-19 Vaccine (6 - 2023-24 season) 12/18/2021    Past Medical History:  Diagnosis Date   Allergy    food allergies and some additives. But she does not know what will cause.   Depression    GERD (gastroesophageal reflux disease)    had in past but now controlled. Has hiatal hernia.   Heart murmur    History of blood clots 1996?   left leg   History of colon polyps    History of hiatal hernia    Hyperlipidemia     Past Surgical History:  Procedure Laterality Date   APPENDECTOMY  1972   DILATION AND CURETTAGE OF UTERUS     HIATAL HERNIA REPAIR  2016   INSERTION OF MESH N/A 01/09/2016   Procedure: INSERTION OF MESH;  Surgeon: Ralene Ok, MD;  Location: [facility];  Service: General;  Laterality: N/A;   KNEE SURGERY Bilateral    hx of torn meniscus (arthroscopic surgery)   LEG SURGERY Left 2001   Had veins stripped    SHOULDER SURGERY Right    reports hx arthroscopic surgery    Family History  Problem Relation Age of Onset   Diabetes Mother    Hypothyroidism Mother    Seizures Mother                     Social History   Socioeconomic History    Marital status: Divorced    Spouse name: Not on file   Number of children: Not on file   Years of education: Not on file   Highest education level: Not on file  Occupational History   Not on file  Tobacco Use   Smoking status: Never   Smokeless tobacco: Never  Vaping Use   Vaping Use: Never used  Substance and Sexual Activity   Alcohol use: Yes    Comment: 2 cans "hard cider"/day   Drug use: No   Sexual activity: Not on file  Other Topics Concern   Not on file  Social History Narrative   Lives alone- can walk to her daughter's home   Daughter in [place]   Son in [place]   Son [place]   4 grandchildren (twin girls age 74)   Retired- Programmer, 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   Divorced   4 cats and a dog   Enjoys antiquing.  Buys and sells.  Enjoys shopping   Social  Determinants of Health   Financial Resource Strain: Low Risk  (11/10/2020)   Overall Financial Resource Strain (CARDIA)    Difficulty of Paying Living Expenses: Not hard at all  Food Insecurity: No Food Insecurity (06/18/2022)   Hunger Vital Sign    Worried About Running Out of Food in the Last Year: Never true    Ran Out of Food in the Last Year: Never true  Transportation Needs: No Transportation Needs (06/18/2022)   PRAPARE - Hydrologist (Medical): No    Lack of Transportation (Non-Medical): No  Physical Activity: Inactive (11/10/2020)   Exercise Vital Sign    Days of Exercise per Week: 0 days    Minutes of Exercise per Session: 0 min  Stress: No Stress Concern Present (11/10/2020)   [place]    Feeling of Stress : Not at all  Social Connections: Socially Isolated (11/10/2020)   Social Connection and Isolation Panel [NHANES]    Frequency of Communication with Friends and Family: More than three times a week    Frequency of Social Gatherings with Friends and Family: More than three times a week     Attends Religious Services: Never    Marine scientist or Organizations: No    Attends Archivist Meetings: Never    Marital Status: Divorced  Human resources officer Violence: Not At Risk (06/18/2022)   Humiliation, Afraid, Rape, and Kick questionnaire    Fear of Current or Ex-Partner: No    Emotionally Abused: No    Physically Abused: No    Sexually Abused: No    Outpatient Medications Prior to Visit  Medication Sig Dispense Refill   aspirin EC 81 MG tablet Take 1 tablet (81 mg total) by mouth daily. (Patient taking differently: Take 162 mg by mouth daily. Take 2 tablets by mouth daily.)     buPROPion (WELLBUTRIN XL) 300 MG 24 hr tablet Take 1 tablet (300 mg total) by mouth every morning. 90 tablet 1   citalopram (CELEXA) 40 MG tablet Take 1 tablet (40 mg total) by mouth daily. 90 tablet 1   COVID-19 mRNA bivalent vaccine, Pfizer, (PFIZER COVID-19 VAC BIVALENT) injection Inject into the muscle. 0.3 mL 0   cyanocobalamin 1000 MCG tablet Take 1,000 mcg by mouth daily.     pantoprazole (PROTONIX) 40 MG tablet TAKE 1 TABLET(40 MG) BY MOUTH DAILY 90 tablet 1   vitamin B-12 (CYANOCOBALAMIN) 500 MCG tablet Take 500 mcg by mouth daily.     VITAMIN D PO Take 2,000 Units by mouth daily.     zolpidem (AMBIEN) 5 MG tablet TAKE 1 TABLET BY MOUTH EVERY NIGHT AT BEDTIME AS NEEDED FOR SLEEP 30 tablet 0   Zoster Vaccine Adjuvanted [facility]) injection Inject 0.76ml IM Now and again in 2-12months. 0.5 mL 0   No facility-administered medications prior to visit.    No Known Allergies  ROS     Objective:    Physical Exam Constitutional:      General: She is not in acute distress.    Appearance: Normal appearance. She is well-developed.  HENT:     Head: Normocephalic and atraumatic.     Right Ear: External ear normal.     Left Ear: External ear normal.  Eyes:     General: No scleral icterus. Neck:     Thyroid: No thyromegaly.  Cardiovascular:     Rate and Rhythm: Normal rate and  regular rhythm.  Pulses:          Dorsalis pedis pulses are 2+ on the right side and 2+ on the left side.       Posterior tibial pulses are 1+ on the right side and 1+ on the left side.     Heart sounds: Normal heart sounds. No murmur heard.    Comments: Left foot is purple on instep and toes Brisk cap refill noted of toes Toes on both feet are mildly cool to the touch Pulmonary:     Effort: Pulmonary effort is normal. No respiratory distress.     Breath sounds: Normal breath sounds. No wheezing.  Musculoskeletal:     Cervical back: Neck supple.     Right lower leg: No edema.     Left lower leg: No edema.  Skin:    General: Skin is warm and dry.  Neurological:     Mental Status: She is alert and oriented to person, place, and time.     Comments: + [place] right, mildly positive on the left  Psychiatric:        Mood and Affect: Mood normal.        Behavior: Behavior normal.        Thought Content: Thought content normal.        Judgment: Judgment normal.     BP 114/68 (BP Location: Right Arm, Patient Position: Sitting, Cuff Size: Small)   Pulse 72   Temp 97.7 F (36.5 C) (Oral)   Resp 16   Wt 170 lb (77.1 kg)   SpO2 99%   BMI 31.09 kg/m  Wt Readings from Last 3 Encounters:  07/15/22 170 lb (77.1 kg)  05/21/22 175 lb 4.3 oz (79.5 kg)  05/21/22 175 lb 3.2 oz (79.5 kg)         Assessment & Plan:   Problem List Items Addressed This Visit       Unprioritized   Weight loss - Primary    Will obtain labs as ordered.  She is advised to return in 1 month.  If continued weight loss consider further work up to evaluate for underlying malignancy.       Relevant Orders   Comp Met (CMET)   TSH   CBC w/Diff   Discoloration of skin of foot    New. Will obtain LE doppler this evening to rule out DVT. Will request urgent visit with vascular surgeon. Pt advised to go to ER if symptoms worsen. Case/plan discussed with Dr. Charlett Blake.       Relevant Orders   US Venous Img  Lower Bilateral   Ambulatory referral to Vascular Surgery   Benign paroxysmal positional vertigo of right ear    New.  She notes unsteady gait, not improved with PT. Will refer for vestibular rehab.      Relevant Orders   Ambulatory referral to Physical Therapy   Other Visit Diagnoses     Encounter for Medicare annual wellness exam           I am having Kerin Salen maintain her aspirin EC, cyanocobalamin, VITAMIN D PO, Pfizer COVID-19 Vac Bivalent, cyanocobalamin, Zoster Vaccine Adjuvanted, buPROPion, citalopram, pantoprazole, and zolpidem.  No orders of the defined types were placed in this encounter.

## 2022-07-15 NOTE — Telephone Encounter (Signed)
Reviewed LE Doppler results with the following finding:      IMPRESSION: 1. 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 2. No evidence of right lower extremity DVT. 3. 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 4. 14 x 7 x 11 mm cyst in the right popliteal fossa, likely Baker's cyst.    Plan to start xarelto starter pack.  Pt is advised of results and advised to begin xarelto.  She has not heard back from the Vascular office about appointment.  I advised her to call their office to schedule an appointment on Monday.  She understands need to go to ER if she develops chest pain or SOB. Advised her to follow up with me in 3 weeks.

## 2022-07-15 NOTE — Assessment & Plan Note (Signed)
Will obtain labs as ordered.  She is advised to return in 1 month.  If continued weight loss consider further work up to evaluate for underlying malignancy.

## 2022-07-15 NOTE — Assessment & Plan Note (Signed)
New.  She notes unsteady gait, not improved with PT. Will refer for vestibular rehab.

## 2022-07-16 LAB — COMPREHENSIVE METABOLIC PANEL
AG Ratio: 1.7 (calc) (ref 1.0–2.5)
ALT: 11 U/L (ref 6–29)
AST: 13 U/L (ref 10–35)
Albumin: 4.2 g/dL (ref 3.6–5.1)
Alkaline phosphatase (APISO): 58 U/L (ref 37–153)
BUN: 18 mg/dL (ref 7–25)
CO2: 26 mmol/L (ref 20–32)
Calcium: 9.2 mg/dL (ref 8.6–10.4)
Chloride: 104 mmol/L (ref 98–110)
Creat: 0.78 mg/dL (ref 0.60–1.00)
Globulin: 2.5 g/dL (calc) (ref 1.9–3.7)
Glucose, Bld: 96 mg/dL (ref 65–99)
Potassium: 4.7 mmol/L (ref 3.5–5.3)
Sodium: 141 mmol/L (ref 135–146)
Total Bilirubin: 0.5 mg/dL (ref 0.2–1.2)
Total Protein: 6.7 g/dL (ref 6.1–8.1)

## 2022-07-16 LAB — CBC WITH DIFFERENTIAL/PLATELET
Absolute Monocytes: 358 cells/uL (ref 200–950)
Basophils Absolute: 39 cells/uL (ref 0–200)
Basophils Relative: 0.8 %
Eosinophils Absolute: 196 cells/uL (ref 15–500)
Eosinophils Relative: 4 %
HCT: 40 % (ref 35.0–45.0)
Hemoglobin: 13.2 g/dL (ref 11.7–15.5)
Lymphs Abs: 1656 cells/uL (ref 850–3900)
MCH: 28.7 pg (ref 27.0–33.0)
MCHC: 33 g/dL (ref 32.0–36.0)
MCV: 87 fL (ref 80.0–100.0)
MPV: 9.6 fL (ref 7.5–12.5)
Monocytes Relative: 7.3 %
Neutro Abs: 2651 cells/uL (ref 1500–7800)
Neutrophils Relative %: 54.1 %
Platelets: 251 10*3/uL (ref 140–400)
RBC: 4.6 10*6/uL (ref 3.80–5.10)
RDW: 12.7 % (ref 11.0–15.0)
Total Lymphocyte: 33.8 %
WBC: 4.9 10*3/uL (ref 3.8–10.8)

## 2022-07-16 LAB — TSH: TSH: 2.81 mIU/L (ref 0.40–4.50)

## 2022-07-17 ENCOUNTER — Telehealth: Payer: Self-pay | Admitting: Family

## 2022-07-17 DIAGNOSIS — I824Z2 Acute embolism and thrombosis of unspecified deep veins of left distal lower extremity: Secondary | ICD-10-CM

## 2022-07-17 LAB — DRUG MONITORING, PANEL 8 WITH CONFIRMATION, URINE
6 Acetylmorphine: NEGATIVE ng/mL (ref <10)
Alcohol Metabolites: POSITIVE ng/mL — AB (ref <500)
Amphetamines: NEGATIVE ng/mL (ref <500)
Benzodiazepines: NEGATIVE ng/mL (ref <100)
Buprenorphine, Urine: NEGATIVE ng/mL (ref <5)
Cocaine Metabolite: NEGATIVE ng/mL (ref <150)
Creatinine: 170.4 mg/dL (ref >=20.0)
Ethyl Glucuronide (ETG): 1428 ng/mL — ABNORMAL HIGH (ref <500)
Ethyl Sulfate (ETS): 160 ng/mL — ABNORMAL HIGH (ref <100)
MDMA: NEGATIVE ng/mL (ref <500)
Marijuana Metabolite: NEGATIVE ng/mL (ref <20)
Opiates: NEGATIVE ng/mL (ref <100)
Oxidant: NEGATIVE ug/mL (ref <200)
Oxycodone: NEGATIVE ng/mL (ref <100)
pH: 7.8 (ref 4.5–9.0)

## 2022-07-17 LAB — DM TEMPLATE

## 2022-07-17 NOTE — Telephone Encounter (Signed)
See mychart.  

## 2022-07-19 NOTE — Progress Notes (Unsigned)
[facility] [place]  ASSESSMENT / PLAN: 77 y.o. female with cyanosis of bilateral feet. No evidence of hemodynamically significant peripheral arterial disease on clinical exam or noninvasive testing today. She does have chronic venous insufficiency. 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 Recommend compression and elevation. Follow up with me on an as needed basis.   CHIEF COMPLAINT: bluish discoloration of feet  HISTORY OF PRESENT ILLNESS: Judy Weiss is a 77 y.o. female who presents to clinic for evaluation of bluish discoloration of bilateral feet.  This was very concerning to her primary care physician when she was evaluated recently, prompting referral.  The patient has no typical symptoms of peripheral arterial disease.  No claudication.  No rest pain.  No ulceration.  She is long history of chronic venous insufficiency.  She has undergone multiple venous interventions.  She reports she stubbed her toe and noticed bluish discoloration develop across her feet.  Past Medical History:  Diagnosis Date   Allergy    food allergies and some additives. But she does not know what will cause.   Depression    GERD (gastroesophageal reflux disease)    had in past but now controlled. Has hiatal hernia.   Heart murmur    History of blood clots 1996?   left leg   History of colon polyps    History of hiatal hernia    Hyperlipidemia     Past Surgical History:  Procedure Laterality Date   APPENDECTOMY  1972   DILATION AND CURETTAGE OF UTERUS     HIATAL HERNIA REPAIR  2016   INSERTION OF MESH N/A 01/09/2016   Procedure: INSERTION OF MESH;  Surgeon: Ralene Ok, MD;  Location: [facility];  Service: General;  Laterality: N/A;   KNEE SURGERY Bilateral    hx of torn meniscus (arthroscopic surgery)   LEG SURGERY Left 2001   Had veins stripped    SHOULDER SURGERY Right    reports hx arthroscopic surgery    Family History  Problem Relation Age of Onset   Diabetes  Mother    Hypothyroidism Mother    Seizures Mother     Social History   Socioeconomic History   Marital status: Divorced    Spouse name: Not on file   Number of children: Not on file   Years of education: Not on file   Highest education level: Not on file  Occupational History   Not on file  Tobacco Use   Smoking status: Never   Smokeless tobacco: Never  Vaping Use   Vaping Use: Never used  Substance and Sexual Activity   Alcohol use: Yes    Comment: 2 cans "hard cider"/day   Drug use: No   Sexual activity: Not on file  Other Topics Concern   Not on file  Social History Narrative   Lives alone- can walk to her daughter's home   Daughter in [place]   Son in [place]   Son [place]   4 grandchildren (twin girls age 54)   Retired- Programmer, 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   Divorced   4 cats and a dog   Enjoys antiquing.  Buys and sells.  Enjoys shopping   Social Determinants of Health   Financial Resource Strain: Low Risk  (11/10/2020)   Overall Financial Resource Strain (CARDIA)    Difficulty of Paying Living Expenses: Not hard at all  Food Insecurity: No Food Insecurity (06/18/2022)   Hunger Vital Sign  Worried About Charity fundraiser in the Last Year: Never true    [place] in the Last Year: Never true  Transportation Needs: No Transportation Needs (06/18/2022)   PRAPARE - Hydrologist (Medical): No    Lack of Transportation (Non-Medical): No  Physical Activity: Inactive (11/10/2020)   Exercise Vital Sign    Days of Exercise per Week: 0 days    Minutes of Exercise per Session: 0 min  Stress: No Stress Concern Present (11/10/2020)   [place]    Feeling of Stress : Not at all  Social Connections: Socially Isolated (11/10/2020)   Social Connection and Isolation Panel [NHANES]    Frequency of Communication with Friends and Family: More than three  times a week    Frequency of Social Gatherings with Friends and Family: More than three times a week    Attends Religious Services: Never    Marine scientist or Organizations: No    Attends Archivist Meetings: Never    Marital Status: Divorced  Human resources officer Violence: Not At Risk (06/18/2022)   Humiliation, Afraid, Rape, and Kick questionnaire    Fear of Current or Ex-Partner: No    Emotionally Abused: No    Physically Abused: No    Sexually Abused: No    No Known Allergies  Current Outpatient Medications  Medication Sig Dispense Refill   aspirin EC 81 MG tablet Take 1 tablet (81 mg total) by mouth daily. (Patient taking differently: Take 162 mg by mouth daily. Take 2 tablets by mouth daily.)     buPROPion (WELLBUTRIN XL) 300 MG 24 hr tablet Take 1 tablet (300 mg total) by mouth every morning. 90 tablet 1   citalopram (CELEXA) 40 MG tablet Take 1 tablet (40 mg total) by mouth daily. 90 tablet 1   COVID-19 mRNA bivalent vaccine, Pfizer, (PFIZER COVID-19 VAC BIVALENT) injection Inject into the muscle. 0.3 mL 0   cyanocobalamin 1000 MCG tablet Take 1,000 mcg by mouth daily.     pantoprazole (PROTONIX) 40 MG tablet TAKE 1 TABLET(40 MG) BY MOUTH DAILY 90 tablet 1   RIVAROXABAN (XARELTO) VTE STARTER PACK (15 & 20 MG) Follow package directions: Take one 15mg  tablet by mouth twice a day. On day 22, switch to one 20mg  tablet once a day. Take with food. 51 each 0   vitamin B-12 (CYANOCOBALAMIN) 500 MCG tablet Take 500 mcg by mouth daily.     VITAMIN D PO Take 2,000 Units by mouth daily.     zolpidem (AMBIEN) 5 MG tablet TAKE 1 TABLET BY MOUTH EVERY NIGHT AT BEDTIME AS NEEDED FOR SLEEP 30 tablet 0   Zoster Vaccine Adjuvanted [facility]) injection Inject 0.28ml IM Now and again in 2-5months. 0.5 mL 0   No current facility-administered medications for this visit.    PHYSICAL EXAM There were no vitals filed for this visit.  Elderly woman in no acute distress Regular rate and  rhythm Unlabored breathing Easily palpable pedal pulses bilaterally Cyanosis across the distal feet bilaterally.   Rapid capillary refill bilaterally Prominent reticular veins and varicosities across the bilateral lower extremities  PERTINENT LABORATORY AND RADIOLOGIC DATA  Most recent CBC    Latest Ref Rng & Units 07/15/2022    2:40 PM 10/23/2021    3:55 PM 04/08/2020    3:47 PM  CBC  WBC 3.8 - 10.8 Thousand/uL 4.9  4.9  4.6   Hemoglobin 11.7 -  15.5 g/dL 13.2  13.8  13.2   Hematocrit 35.0 - 45.0 % 40.0  42.3  39.8   Platelets 140 - 400 Thousand/uL 251  230  232.0      Most recent CMP    Latest Ref Rng & Units 07/15/2022    2:40 PM 10/23/2021    3:55 PM 04/08/2020    3:47 PM  CMP  Glucose 65 - 99 mg/dL 96  71  102   BUN 7 - 25 mg/dL 18  16  19    Creatinine 0.60 - 1.00 mg/dL 0.78  0.90  0.71   Sodium 135 - 146 mmol/L 141  141  140   Potassium 3.5 - 5.3 mmol/L 4.7  4.8  4.7   Chloride 98 - 110 mmol/L 104  105  104   CO2 20 - 32 mmol/L 26  27  31    Calcium 8.6 - 10.4 mg/dL 9.2  9.5  9.1   Total Protein 6.1 - 8.1 g/dL 6.7   6.7   Total Bilirubin 0.2 - 1.2 mg/dL 0.5   0.7   Alkaline Phos 39 - 117 U/L   55   AST 10 - 35 U/L 13   13   ALT 6 - 29 U/L 11   10     Renal function Estimated Creatinine Clearance: 54.8 mL/min (by C-G formula based on SCr of 0.78 mg/dL).  Hgb A1c MFr Bld (%)  Date Value  04/08/2020 5.7    LDL Cholesterol  Date Value Ref Range Status  07/06/2016 108 (H) 0 - 99 mg/dL Final     +-------+-----------+-----------+------------+------------+  ABI/TBIToday's ABIToday's TBIPrevious ABIPrevious TBI  +-------+-----------+-----------+------------+------------+  Right 1.32       0.70                                 +-------+-----------+-----------+------------+------------+  Left  1.44       1.14                                 +-------+-----------+-----------+------------+------------+   Judy Weiss. Stanford Breed, MD FACS [facility] Phone Number: [phone] 07/19/2022 2:22 PM   Total time spent on preparing this encounter including chart review, data review, collecting history, examining the patient, coordinating care for this new patient, 60 minutes.  Portions of this report may have been transcribed using voice recognition software.  Every effort has been made to ensure accuracy; however, inadvertent computerized transcription errors may still be present.

## 2022-07-20 ENCOUNTER — Ambulatory Visit (HOSPITAL_COMMUNITY)
Admission: RE | Admit: 2022-07-20 | Discharge: 2022-07-20 | Disposition: A | Discharge status: Home / Self Care | Payer: Medicare Other | Source: Ambulatory Visit | Attending: Vascular Surgery | Admitting: Vascular Surgery

## 2022-07-20 ENCOUNTER — Encounter: Payer: Self-pay | Admitting: Vascular Surgery

## 2022-07-20 ENCOUNTER — Other Ambulatory Visit: Payer: Self-pay | Admitting: *Deleted

## 2022-07-20 ENCOUNTER — Ambulatory Visit: Payer: Medicare Other | Admitting: Vascular Surgery

## 2022-07-20 VITALS — BP 120/73 | HR 76 | Temp 98.3°F | Resp 20 | Ht 62.0 in | Wt 173.0 lb

## 2022-07-20 DIAGNOSIS — I872 Venous insufficiency (chronic) (peripheral): Secondary | ICD-10-CM | POA: Diagnosis not present

## 2022-07-20 DIAGNOSIS — M79673 Pain in unspecified foot: Secondary | ICD-10-CM

## 2022-07-20 DIAGNOSIS — I739 Peripheral vascular disease, unspecified: Secondary | ICD-10-CM

## 2022-07-21 LAB — VAS US ABI WITH/WO TBI
Left ABI: 1.44
Right ABI: 1.32

## 2022-07-27 ENCOUNTER — Telehealth: Payer: Self-pay | Admitting: Family

## 2022-07-27 NOTE — Telephone Encounter (Signed)
-----   Message from Avon Gully sent at 07/26/2022  4:13 PM EDT ----- Regarding: RE: Referral Closed I82.4Z2 (ICD-10-CM) - Acute deep vein thrombosis (DVT) of distal vein of left lower extremity ----- Message ----- From: Sandford Craze, NP Sent: 07/24/2022  10:48 AM EDT To: Kallie Locks Flynt Subject: RE: Referral Closed                            Hello,  Thank you for letting me know.  Which referral is this regarding?   Paije Goodhart  ----- Message ----- From: Avon Gully Sent: 07/22/2022   8:38 AM EDT To: Sandford Craze, NP Subject: Referral Closed                                Good morning,   Third and final attempt - Called and lvm for call back to schedule a new heme appointment. I explained that this was the third and final attempt and referral would be closed. However, I did say that if they changed their mind and would like to be seen, I would be more than happy to schedule.  Thank you  Bjorn Loser

## 2022-07-27 NOTE — Telephone Encounter (Signed)
Please contact pt and schedule her a follow up visit around 4/20 with me.

## 2022-07-29 DIAGNOSIS — H31001 Unspecified chorioretinal scars, right eye: Secondary | ICD-10-CM | POA: Diagnosis not present

## 2022-07-29 DIAGNOSIS — H52223 Regular astigmatism, bilateral: Secondary | ICD-10-CM | POA: Diagnosis not present

## 2022-07-29 DIAGNOSIS — H524 Presbyopia: Secondary | ICD-10-CM | POA: Diagnosis not present

## 2022-07-29 DIAGNOSIS — H5201 Hypermetropia, right eye: Secondary | ICD-10-CM | POA: Diagnosis not present

## 2022-07-29 DIAGNOSIS — H2513 Age-related nuclear cataract, bilateral: Secondary | ICD-10-CM | POA: Diagnosis not present

## 2022-08-06 ENCOUNTER — Ambulatory Visit: Payer: Medicare Other | Admitting: Family

## 2022-08-09 MED ORDER — RIVAROXABAN (XARELTO) VTE STARTER PACK (15 & 20 MG): ORAL_TABLET | ORAL | 0 refills | Status: DC

## 2022-08-09 NOTE — Telephone Encounter (Signed)
Due to the fact that this is her second blood clot, I would still like for her to meet with hematology. Question for them is if 3 months is adequate since this is  her second clot. Sometimes hematology wants them to stay on it long term based on risk of recurrence.  I have sent the 2nd and 3rd month refills for her xarelto.

## 2022-08-09 NOTE — Addendum Note (Signed)
Addended by: Sandford Craze on: 08/09/2022 09:59 AM   Modules accepted: Orders

## 2022-08-09 NOTE — Telephone Encounter (Signed)
Called patient but no answer, she has appointment tomorrow with PCP to discuss referral

## 2022-08-10 ENCOUNTER — Ambulatory Visit (INDEPENDENT_AMBULATORY_CARE_PROVIDER_SITE_OTHER): Payer: Medicare Other | Admitting: Family

## 2022-08-10 VITALS — BP 117/67 | HR 71 | Temp 97.7°F | Resp 16 | Wt 169.0 lb

## 2022-08-10 DIAGNOSIS — H8111 Benign paroxysmal vertigo, right ear: Secondary | ICD-10-CM | POA: Diagnosis not present

## 2022-08-10 DIAGNOSIS — Z86718 Personal history of other venous thrombosis and embolism: Secondary | ICD-10-CM | POA: Diagnosis not present

## 2022-08-10 DIAGNOSIS — I872 Venous insufficiency (chronic) (peripheral): Secondary | ICD-10-CM | POA: Diagnosis not present

## 2022-08-10 DIAGNOSIS — F32A Depression, unspecified: Secondary | ICD-10-CM

## 2022-08-10 MED ORDER — CITALOPRAM HYDROBROMIDE 20 MG PO TABS: ORAL_TABLET | ORAL | 0 refills | Status: DC

## 2022-08-10 MED ORDER — DULOXETINE HCL 30 MG PO CPEP: ORAL_CAPSULE | ORAL | 1 refills | Status: DC

## 2022-08-10 NOTE — Progress Notes (Signed)
Subjective:   By signing my name below, I, Judy Weiss, attest that this documentation has been prepared under the direction and in the presence of Sandford Craze, NP.  08/10/2022.   Patient ID: Judy Weiss, female    DOB: 05/13/1945, 77 y.o.   MRN: [id number]  Chief Complaint  Patient presents with   Gait Problem    Patient reports balance problem w/o feeling dizzy    DVT    Here for discuss referral to hematology    HPI Patient is in today for an office visit.   Balance:  She states that she hasn't felt dizzy. She experiences sensations of feeling off-center and off-balance. Her symptoms will come and go. If she is out walking her dog or otherwise for extended periods, she carries a walking stick for assistance. Not long ago she did have a fall. While holding onto a large trash can, she suddenly fell over to the side.   Mood:  Generally she is feeling okay and stable most of the time, although she wishes she was feeling a bit better. She is compliant with her wellbutrin which she has been on since the early 1990's. However, she is not satisfied with her current baseline on wellbutrin.  Spells:  At times she has "spells" that she describes as a hot flash, associated with generalized weakness and diaphoresis. These spells usually last 2-3 minutes. Last week she had one recurring spell, which was the first within the past 2-3 months. She is concerned that her spells are an indication of diabetes.  Appetite: Lately her appetite has significantly decreased.   Allergies:  Her sinus congestion and seasonal allergies have been worse than usual this year.  DVT:  Since her last visit, she has seen Dr. Lenell Antu in vascular surgery.  Blood pressure:  She states that her blood pressure always runs low. BP Readings from Last 3 Encounters:  08/10/22 117/67  07/20/22 120/73  07/15/22 114/68    Past Medical History:  Diagnosis Date   Allergy    food allergies and some additives. But  she does not know what will cause.   Depression    GERD (gastroesophageal reflux disease)    had in past but now controlled. Has hiatal hernia.   Heart murmur    History of blood clots 1996?   left leg   History of colon polyps    History of hiatal hernia    Hyperlipidemia     Past Surgical History:  Procedure Laterality Date   APPENDECTOMY  1972   DILATION AND CURETTAGE OF UTERUS     HIATAL HERNIA REPAIR  2016   INSERTION OF MESH N/A 01/09/2016   Procedure: INSERTION OF MESH;  Surgeon: Axel Filler, MD;  Location: [facility];  Service: General;  Laterality: N/A;   KNEE SURGERY Bilateral    hx of torn meniscus (arthroscopic surgery)   LEG SURGERY Left 2001   Had veins stripped    SHOULDER SURGERY Right    reports hx arthroscopic surgery    Family History  Problem Relation Age of Onset   Diabetes Mother    Hypothyroidism Mother    Seizures Mother     Social History   Socioeconomic History   Marital status: Divorced    Spouse name: Not on file   Number of children: Not on file   Years of education: Not on file   Highest education level: Associate degree: occupational, Scientist, product/process development, or vocational program  Occupational History   Not on file  Tobacco Use   Smoking status: Never   Smokeless tobacco: Never  Vaping Use   Vaping Use: Never used  Substance and Sexual Activity   Alcohol use: Yes    Comment: 2 cans "hard cider"/day   Drug use: No   Sexual activity: Not on file  Other Topics Concern   Not on file  Social History Narrative   Lives alone- can walk to her daughter's home   Daughter in [place]   Son in [place]   Son [place]   4 grandchildren (twin girls age 36)   Retired- Presenter, broadcasting at a Associate Professor x 25 years.   Divorced   4 cats and a dog   Enjoys antiquing.  Buys and sells.  Enjoys shopping   Social Determinants of Health   Financial Resource Strain: Low Risk  (08/08/2022)   Overall Financial Resource Strain (CARDIA)     Difficulty of Paying Living Expenses: Not very hard  Food Insecurity: No Food Insecurity (08/08/2022)   Hunger Vital Sign    Worried About Running Out of Food in the Last Year: Never true    Ran Out of Food in the Last Year: Never true  Transportation Needs: No Transportation Needs (08/08/2022)   PRAPARE - Administrator, Civil Service (Medical): No    Lack of Transportation (Non-Medical): No  Physical Activity: Insufficiently Active (08/08/2022)   Exercise Vital Sign    Days of Exercise per Week: 1 day    Minutes of Exercise per Session: 20 min  Stress: No Stress Concern Present (08/08/2022)   [employer] of Occupational Health - Occupational Stress Questionnaire    Feeling of Stress : Only a little  Social Connections: Socially Isolated (08/08/2022)   Social Connection and Isolation Panel [NHANES]    Frequency of Communication with Friends and Family: Once a week    Frequency of Social Gatherings with Friends and Family: Once a week    Attends Religious Services: Never    Database administrator or Organizations: No    Attends Engineer, structural: Not on file    Marital Status: Divorced  Intimate Partner Violence: Not At Risk (06/18/2022)   Humiliation, Afraid, Rape, and Kick questionnaire    Fear of Current or Ex-Partner: No    Emotionally Abused: No    Physically Abused: No    Sexually Abused: No    Outpatient Medications Prior to Visit  Medication Sig Dispense Refill   aspirin EC 81 MG tablet Take 1 tablet (81 mg total) by mouth daily. (Patient taking differently: Take 162 mg by mouth daily. Take 2 tablets by mouth daily.)     buPROPion (WELLBUTRIN XL) 300 MG 24 hr tablet Take 1 tablet (300 mg total) by mouth every morning. 90 tablet 1   COVID-19 mRNA bivalent vaccine, Pfizer, (PFIZER COVID-19 VAC BIVALENT) injection Inject into the muscle. 0.3 mL 0   cyanocobalamin 1000 MCG tablet Take 1,000 mcg by mouth daily.     RIVAROXABAN (XARELTO) VTE STARTER PACK  (15 & 20 MG) Follow package directions: Take one  tablet by mouth twice a day. On day 22, switch to one  tablet once a day. Take with food. 51 each 0   RIVAROXABAN (XARELTO) VTE STARTER PACK (15 & 20 MG) Follow package directions: Take one  tablet by mouth twice a day. On day 22, switch to one  tablet once a day. Take with food. 60 each 0   vitamin B-12 (CYANOCOBALAMIN) 500  MCG tablet Take 500 mcg by mouth daily.     VITAMIN D PO Take 2,000 Units by mouth daily.     zolpidem (AMBIEN) 5 MG tablet TAKE 1 TABLET BY MOUTH EVERY NIGHT AT BEDTIME AS NEEDED FOR SLEEP 30 tablet 0   citalopram (CELEXA) 40 MG tablet Take 1 tablet (40 mg total) by mouth daily. 90 tablet 1   pantoprazole (PROTONIX) 40 MG tablet TAKE 1 TABLET(40 MG) BY MOUTH DAILY (Patient not taking: Reported on 08/10/2022) 90 tablet 1   No facility-administered medications prior to visit.    No Known Allergies  Review of Systems  HENT:  Positive for congestion.   Gastrointestinal:        +Loss of appetite  Neurological:        +Imbalance/gait instability  Endo/Heme/Allergies:  Positive for environmental allergies.    See HPI.     Objective:    Physical Exam Constitutional:      Appearance: Normal appearance.  HENT:     Head: Normocephalic and atraumatic.     Right Ear: Tympanic membrane, ear canal and external ear normal.     Left Ear: Tympanic membrane, ear canal and external ear normal.  Eyes:     Extraocular Movements: Extraocular movements intact.     Pupils: Pupils are equal, round, and reactive to light.  Cardiovascular:     Rate and Rhythm: Normal rate and regular rhythm.     Heart sounds: Normal heart sounds. No murmur heard.    No gallop.  Pulmonary:     Effort: Pulmonary effort is normal. No respiratory distress.     Breath sounds: Normal breath sounds. No wheezing or rales.  Skin:    General: Skin is warm and dry.  Neurological:     General: No focal deficit present.     Mental Status: She  is alert and oriented to person, place, and time.     Motor: Motor function is intact.     Comments: 5/5 muscle strength of upper and lower extremities.  Psychiatric:        Mood and Affect: Mood normal.        Behavior: Behavior normal.     BP 117/67 (BP Location: Right Arm, Patient Position: Sitting, Cuff Size: Small)   Pulse 71   Temp 97.7 F (36.5 C) (Oral)   Resp 16   Wt 169 lb (76.7 kg)   SpO2 99%   BMI 30.91 kg/m  Wt Readings from Last 3 Encounters:  08/10/22 169 lb (76.7 kg)  07/20/22 173 lb (78.5 kg)  07/15/22 170 lb (77.1 kg)    Diabetic Foot Exam - Simple   No data filed    Lab Results  Component Value Date   WBC 4.9 07/15/2022   HGB 13.2 07/15/2022   HCT 40.0 07/15/2022   PLT 251 07/15/2022   GLUCOSE 96 07/15/2022   CHOL 187 07/06/2016   TRIG 93.0 07/06/2016   HDL 60.30 07/06/2016   LDLCALC 108 (H) 07/06/2016   ALT 11 07/15/2022   AST 13 07/15/2022   NA 141 07/15/2022   K 4.7 07/15/2022   CL 104 07/15/2022   CREATININE 0.78 07/15/2022   BUN 18 07/15/2022   CO2 26 07/15/2022   TSH 2.81 07/15/2022   HGBA1C 5.7 04/08/2020    Lab Results  Component Value Date   TSH 2.81 07/15/2022   Lab Results  Component Value Date   WBC 4.9 07/15/2022   HGB 13.2 07/15/2022   HCT 40.0 07/15/2022  MCV 87.0 07/15/2022   PLT 251 07/15/2022   Lab Results  Component Value Date   NA 141 07/15/2022   K 4.7 07/15/2022   CO2 26 07/15/2022   GLUCOSE 96 07/15/2022   BUN 18 07/15/2022   CREATININE 0.78 07/15/2022   BILITOT 0.5 07/15/2022   ALKPHOS 55 04/08/2020   AST 13 07/15/2022   ALT 11 07/15/2022   PROT 6.7 07/15/2022   ALBUMIN 4.2 04/08/2020   CALCIUM 9.2 07/15/2022   ANIONGAP 9 12/13/2016   GFR 82.30 04/08/2020   Lab Results  Component Value Date   CHOL 187 07/06/2016   Lab Results  Component Value Date   HDL 60.30 07/06/2016   Lab Results  Component Value Date   LDLCALC 108 (H) 07/06/2016   Lab Results  Component Value Date   TRIG  93.0 07/06/2016   Lab Results  Component Value Date   CHOLHDL 3 07/06/2016   Lab Results  Component Value Date   HGBA1C 5.7 04/08/2020       Assessment & Plan:   Problem List Items Addressed This Visit   None    Meds ordered this encounter  Medications   DULoxetine (CYMBALTA) 30 MG capsule    Sig: Take 1 capsule by mouth once daily for 3 days,then increase to 2 capsules once daily.    Dispense:  60 capsule    Refill:  1    Order Specific Question:   Supervising Provider    Answer:   Danise Edge A [[id number]]   citalopram (CELEXA) 20 MG tablet    Sig: Take 1 tablet by mouth once daily for 1 week, then 1/2 tab once daily for 1 week then stop    Dispense:  12 tablet    Refill:  0    Order Specific Question:   Supervising Provider    Answer:   Urbano Heir, personally preformed the services described in this documentation.  All medical record entries made by the scribe were at my direction and in my presence.  I have reviewed the chart and discharge instructions (if applicable) and agree that the record reflects my personal performance and is accurate and complete. 08/10/2022.  I,Mathew Stumpf,acting as a Neurosurgeon for [employer], NP.,have documented all relevant documentation on the behalf of Lemont Fillers, NP,as directed by  Lemont Fillers, NP while in the presence of Lemont Fillers, NP.   Judy Weiss

## 2022-08-10 NOTE — Patient Instructions (Addendum)
Stop citalopram 40mg . Start citalopram  once daily for 1 week, then de ta  one capsule once daily for 3 days, then increase to 2 caps once daily.   Please call Hematology to schedule your consultation- [phone]

## 2022-08-11 NOTE — Assessment & Plan Note (Addendum)
She is maintained on xarelto. I advised her why I think it is important for her to see hematology for consultation about length of treatment with anticoagulation. I advised her to call to schedule that appointment.

## 2022-08-11 NOTE — Assessment & Plan Note (Signed)
Uncontrolled. Pt is advised as follows:    Stop citalopram . Start citalopram  once daily for 1 week, then decrease to half tab or  once daily for 1 week then stop. At the same time, begin cymbalta  one capsule once daily for 3 days, then increase to 2 caps once daily.

## 2022-08-11 NOTE — Assessment & Plan Note (Signed)
This was recently evaluated by vascular specialist.

## 2022-08-11 NOTE — Assessment & Plan Note (Signed)
Declines vestibular rehab. I advised her to walk with a cane.

## 2022-08-16 ENCOUNTER — Other Ambulatory Visit: Payer: Self-pay | Admitting: *Deleted

## 2022-08-16 ENCOUNTER — Telehealth: Payer: Self-pay | Admitting: Family

## 2022-08-16 MED ORDER — RIVAROXABAN 20 MG PO TABS: 20.0000 mg | ORAL_TABLET | Freq: Every day | ORAL | 0 refills | Status: DC

## 2022-08-16 NOTE — Telephone Encounter (Signed)
Dr. Driscilla Moats dental office called to get an update on the medical release form faxed over to [place] last week. They stated the patient will be there Thursday and they need the guide lines  by today at the latest. Form in on the [employer]. Please advise.

## 2022-08-16 NOTE — Telephone Encounter (Signed)
Form printed out from [employer] (was there since 0/98/11). Put in provider's folder

## 2022-08-18 ENCOUNTER — Telehealth: Payer: Self-pay | Admitting: Family

## 2022-08-18 NOTE — Telephone Encounter (Signed)
Pt is scheduled to have dental procedure tomorrow and is really nervous about it while being on a blood thinner Dentist office sent document on 08/13/22 but have not received a response. Please call pt to advise if Judy Weiss thinks it's okay for her to have dental procedure while on blood thinner and that form has been returned. Pt may need to cancel the appt if not returned and would like to give them notice if this is necessary.

## 2022-08-18 NOTE — Telephone Encounter (Signed)
Per dental office, procedure was cancelled and will be rescheduled. Form will be sent Friday when provider is back.  Patient was notified.  Form in provider's folder

## 2022-08-18 NOTE — Telephone Encounter (Signed)
Dental office called stating they have not received the form yet and they need it back ASAP. Please advise.

## 2022-08-18 NOTE — Telephone Encounter (Signed)
Form will be faxed Tami Ribas will be rescheduled per dental office. Patient notified.

## 2022-08-19 NOTE — Telephone Encounter (Signed)
Since she has a new blood clot in her leg, I would not recommend that we stop her blood thinners for at least 90 days which would be the end of June.

## 2022-08-20 NOTE — Telephone Encounter (Signed)
Spoke with patient. Advised her as below. I asked her to call hematology on Monday to schedule her consultation.

## 2022-08-23 NOTE — Telephone Encounter (Signed)
Dental clearance faxed this morning with this information

## 2022-09-10 ENCOUNTER — Ambulatory Visit: Payer: Medicare Other | Admitting: Family

## 2022-09-14 ENCOUNTER — Other Ambulatory Visit: Payer: Self-pay | Admitting: Family

## 2022-09-14 NOTE — Telephone Encounter (Signed)
Please advise pt that I have placed a refill for her xarelto, but I am not comfortable continuing to provide refill without her seeing hematology for consultation. Please give her the number to hematology and stress that it is an absolute must that she see them so we can decide the safest plan forward for her blood thinners.

## 2022-09-15 NOTE — Telephone Encounter (Signed)
Patient advised of refill and to call Hematology again for appointment. Left voice mail at cancer center for referral coordinator to call patient again about the appointment.

## 2022-09-15 NOTE — Telephone Encounter (Signed)
Called patient but no answer, left voice mail for patient to call back.   

## 2022-09-21 ENCOUNTER — Inpatient Hospital Stay: Payer: Medicare Other

## 2022-09-21 ENCOUNTER — Inpatient Hospital Stay: Payer: Medicare Other | Admitting: Medical Oncology

## 2022-09-27 ENCOUNTER — Inpatient Hospital Stay: Payer: Medicare Other

## 2022-09-27 ENCOUNTER — Inpatient Hospital Stay: Payer: Medicare Other | Admitting: Medical Oncology

## 2022-09-28 ENCOUNTER — Encounter: Payer: Self-pay | Admitting: Medical Oncology

## 2022-09-28 ENCOUNTER — Inpatient Hospital Stay: Payer: Medicare Other

## 2022-09-28 ENCOUNTER — Inpatient Hospital Stay: Payer: Medicare Other | Attending: Medical Oncology | Admitting: Medical Oncology

## 2022-09-28 ENCOUNTER — Other Ambulatory Visit: Payer: Self-pay

## 2022-09-28 VITALS — BP 105/78 | HR 68 | Temp 98.5°F | Resp 18 | Ht 63.0 in | Wt 164.0 lb

## 2022-09-28 DIAGNOSIS — E876 Hypokalemia: Secondary | ICD-10-CM

## 2022-09-28 DIAGNOSIS — F1021 Alcohol dependence, in remission: Secondary | ICD-10-CM | POA: Diagnosis not present

## 2022-09-28 DIAGNOSIS — Z7901 Long term (current) use of anticoagulants: Secondary | ICD-10-CM | POA: Diagnosis not present

## 2022-09-28 DIAGNOSIS — Z86718 Personal history of other venous thrombosis and embolism: Secondary | ICD-10-CM

## 2022-09-28 DIAGNOSIS — Z7982 Long term (current) use of aspirin: Secondary | ICD-10-CM

## 2022-09-28 LAB — CBC WITH DIFFERENTIAL (CANCER CENTER ONLY)
Abs Immature Granulocytes: 0.01 10*3/uL (ref 0.00–0.07)
Basophils Absolute: 0 10*3/uL (ref 0.0–0.1)
Basophils Relative: 1 %
Eosinophils Absolute: 0.1 10*3/uL (ref 0.0–0.5)
Eosinophils Relative: 2 %
HCT: 41 % (ref 36.0–46.0)
Hemoglobin: 13 g/dL (ref 12.0–15.0)
Immature Granulocytes: 0 %
Lymphocytes Relative: 29 %
Lymphs Abs: 1.5 10*3/uL (ref 0.7–4.0)
MCH: 28.6 pg (ref 26.0–34.0)
MCHC: 31.7 g/dL (ref 30.0–36.0)
MCV: 90.1 fL (ref 80.0–100.0)
Monocytes Absolute: 0.4 10*3/uL (ref 0.1–1.0)
Monocytes Relative: 7 %
Neutro Abs: 3.2 10*3/uL (ref 1.7–7.7)
Neutrophils Relative %: 61 %
Platelet Count: 198 10*3/uL (ref 150–400)
RBC: 4.55 MIL/uL (ref 3.87–5.11)
RDW: 12.4 % (ref 11.5–15.5)
WBC Count: 5.2 10*3/uL (ref 4.0–10.5)
nRBC: 0 % (ref 0.0–0.2)

## 2022-09-28 LAB — CMP (CANCER CENTER ONLY)
ALT: 14 U/L (ref 0–44)
AST: 28 U/L (ref 15–41)
Albumin: 4 g/dL (ref 3.5–5.0)
Alkaline Phosphatase: 62 U/L (ref 38–126)
Anion gap: 9 (ref 5–15)
BUN: 21 mg/dL (ref 8–23)
CO2: 26 mmol/L (ref 22–32)
Calcium: 9.2 mg/dL (ref 8.9–10.3)
Chloride: 106 mmol/L (ref 98–111)
Creatinine: 1 mg/dL (ref 0.44–1.00)
GFR, Estimated: 57 mL/min — ABNORMAL LOW (ref >60)
Glucose, Bld: 119 mg/dL — ABNORMAL HIGH (ref 70–99)
Potassium: 5.6 mmol/L — ABNORMAL HIGH (ref 3.5–5.1)
Sodium: 141 mmol/L (ref 135–145)
Total Bilirubin: 0.5 mg/dL (ref 0.3–1.2)
Total Protein: 7.4 g/dL (ref 6.5–8.1)

## 2022-09-28 NOTE — Progress Notes (Signed)
[facility] [phone]   [phone]  INITIAL CONSULT NOTE  Referring Team:   Patient Care Team: Sandford Craze, NP as PCP - General (Internal Medicine) Van Clines, MD as Consulting Physician (Neurology)  HISTORY OF PRESENTING ILLNESS:  Nilda Krolczyk 77 y.o. female with medical history significant for ETOH use and blood clots is referred to our office to discuss her anticoagulation use. Currently she is on Xarelto 20 mg and ASA 162 mg once daily following an unprovoked blood clot of her left leg found on 07/15/2022. She also has a history of another unprovoked DVT (she is unsure which leg) from about 20 years ago. She also has a history of ETOH abuse. She is here to discuss her xarelto use with upcoming dental work. According to patient she was told by her dentist that they would not perform dental work while she is on anticoagulants medications. She is unsure what dental procedures she is having performed exactly- she mentions a potential root canal on a tooth that has a root canal previously. She does not think that the dental work is emergent in nature. She was advised by her PCP to wait until she had been on her anticoagulant medication for at least 3 months since last blood clot before any bridge of medication for this procedure.   Currently she reports that she is doing well with the Xarelto. She denies any rectal bleeding, GI bleeding, epistaxia, hemoptysis, hematuria.   MEDICAL HISTORY:  Past Medical History:  Diagnosis Date   Allergy    food allergies and some additives. But she does not know what will cause.   Depression    GERD (gastroesophageal reflux disease)    had in past but now controlled. Has hiatal hernia.   Heart murmur    History of blood clots 1996?   left leg   History of colon polyps    History of hiatal hernia    Hyperlipidemia     SURGICAL HISTORY: Past Surgical History:  Procedure Laterality Date    APPENDECTOMY  1972   DILATION AND CURETTAGE OF UTERUS     HIATAL HERNIA REPAIR  2016   INSERTION OF MESH N/A 01/09/2016   Procedure: INSERTION OF MESH;  Surgeon: Axel Filler, MD;  Location: [facility];  Service: General;  Laterality: N/A;   KNEE SURGERY Bilateral    hx of torn meniscus (arthroscopic surgery)   LEG SURGERY Left 2001   Had veins stripped    SHOULDER SURGERY Right    reports hx arthroscopic surgery    SOCIAL HISTORY: Social History   Socioeconomic History   Marital status: Divorced    Spouse name: Not on file   Number of children: Not on file   Years of education: Not on file   Highest education level: Associate degree: occupational, Scientist, product/process development, or vocational program  Occupational History   Not on file  Tobacco Use   Smoking status: Never   Smokeless tobacco: Never  Vaping Use   Vaping Use: Never used  Substance and Sexual Activity   Alcohol use: Yes    Comment: 2 cans "hard cider"/day   Drug use: No   Sexual activity: Not on file  Other Topics Concern   Not on file  Social History Narrative   Lives alone- can walk to her daughter's home   Daughter in [place]   Son in [place]   Son [place]   4 grandchildren (twin girls age 8)   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   Divorced   4 cats and a dog   Enjoys antiquing.  Buys and sells.  Enjoys shopping   Social Determinants of Health   Financial Resource Strain: Low Risk  (08/08/2022)   Overall Financial Resource Strain (CARDIA)    Difficulty of Paying Living Expenses: Not very hard  Food Insecurity: No Food Insecurity (08/08/2022)   Hunger Vital Sign    Worried About Running Out of Food in the Last Year: Never true    Ran Out of Food in the Last Year: Never true  Transportation Needs: No Transportation Needs (08/08/2022)   PRAPARE - Administrator, Civil Service (Medical): No    Lack of Transportation (Non-Medical): No  Physical Activity: Insufficiently  Active (08/08/2022)   Exercise Vital Sign    Days of Exercise per Week: 1 day    Minutes of Exercise per Session: 20 min  Stress: No Stress Concern Present (08/08/2022)   [employer] of Occupational Health - Occupational Stress Questionnaire    Feeling of Stress : Only a little  Social Connections: Socially Isolated (08/08/2022)   Social Connection and Isolation Panel [NHANES]    Frequency of Communication with Friends and Family: Once a week    Frequency of Social Gatherings with Friends and Family: Once a week    Attends Religious Services: Never    Database administrator or Organizations: No    Attends Engineer, structural: Not on file    Marital Status: Divorced  Intimate Partner Violence: Not At Risk (06/18/2022)   Humiliation, Afraid, Rape, and Kick questionnaire    Fear of Current or Ex-Partner: No    Emotionally Abused: No    Physically Abused: No    Sexually Abused: No    FAMILY HISTORY: Family History  Problem Relation Age of Onset   Diabetes Mother    Hypothyroidism Mother    Seizures Mother     ALLERGIES:  has No Known Allergies.  MEDICATIONS:  Current Outpatient Medications  Medication Sig Dispense Refill   aspirin EC 81 MG tablet Take 1 tablet (81 mg total) by mouth daily. (Patient taking differently: Take 162 mg by mouth daily. Take 2 tablets by mouth daily.)     buPROPion (WELLBUTRIN XL) 300 MG 24 hr tablet Take 1 tablet (300 mg total) by mouth every morning. 90 tablet 1   COVID-19 mRNA bivalent vaccine, Pfizer, (PFIZER COVID-19 VAC BIVALENT) injection Inject into the muscle. 0.3 mL 0   cyanocobalamin 1000 MCG tablet Take 1,000 mcg by mouth daily.     DULoxetine (CYMBALTA) 30 MG capsule Take 1 capsule by mouth once daily for 3 days,then increase to 2 capsules once daily. 60 capsule 1   pantoprazole (PROTONIX) 40 MG tablet TAKE 1 TABLET(40 MG) BY MOUTH DAILY 90 tablet 1   rivaroxaban (XARELTO) 20 MG TABS tablet TAKE 1 TABLET(20 MG) BY MOUTH DAILY  WITH SUPPER 30 tablet 1   vitamin B-12 (CYANOCOBALAMIN) 500 MCG tablet Take 500 mcg by mouth daily.     VITAMIN D PO Take 2,000 Units by mouth daily.     zolpidem (AMBIEN) 5 MG tablet TAKE 1 TABLET BY MOUTH EVERY NIGHT AT BEDTIME AS NEEDED FOR SLEEP 30 tablet 0   No current facility-administered medications for this visit.    REVIEW OF SYSTEMS:   Constitutional: ( - ) fevers, ( - )  chills , ( - ) night sweats Eyes: ( - ) blurriness of  vision, ( - ) double vision, ( - ) watery eyes Ears, nose, mouth, throat, and face: ( - ) mucositis, ( - ) sore throat Respiratory: ( - ) cough, ( - ) dyspnea, ( - ) wheezes Cardiovascular: ( - ) palpitation, ( - ) chest discomfort, ( - ) lower extremity swelling Gastrointestinal:  ( - ) nausea, ( - ) heartburn, ( - ) change in bowel habits Skin: ( - ) abnormal skin rashes Lymphatics: ( - ) new lymphadenopathy, ( - ) easy bruising Neurological: ( - ) numbness, ( - ) tingling, ( - ) new weaknesses Behavioral/Psych: ( - ) mood change, ( - ) new changes  All other systems were reviewed with the patient and are negative.  PHYSICAL EXAMINATION: ECOG PERFORMANCE STATUS: 1 - Symptomatic but completely ambulatory  Vitals:   09/28/22 1330  BP: 105/78  Pulse: 68  Resp: 18  Temp: 98.5 F (36.9 C)  SpO2: 99%   Filed Weights   09/28/22 1330  Weight: 164 lb (74.4 kg)    GENERAL: well appearing female in NAD  SKIN: skin color, texture, turgor are normal, no rashes or significant lesions EYES: conjunctiva are pink and non-injected, sclera clear OROPHARYNX: no exudate, no erythema; lips, buccal mucosa, and tongue normal  NECK: supple, no lymphadenopathy  LYMPH:  no palpable lymphadenopathy in the cervical, axillary or supraclavicular lymph nodes.  LUNGS: clear to auscultation and percussion with normal breathing effort HEART: regular rate & rhythm and no murmurs and no lower extremity edema ABDOMEN: soft, non-tender, non-distended, normal bowel sounds. No  palpable hepatosplenomegaly  Musculoskeletal: no cyanosis of digits and no clubbing  PSYCH: alert & oriented x 3, fluent speech NEURO: no focal motor/sensory deficits  LABORATORY DATA:  I have reviewed the data as listed    Latest Ref Rng & Units 09/28/2022    2:33 PM 07/15/2022    2:40 PM 10/23/2021    3:55 PM  CBC  WBC 4.0 - 10.5 K/uL 5.2  4.9  4.9   Hemoglobin 12.0 - 15.0 g/dL 16.1  09.6  04.5   Hematocrit 36.0 - 46.0 % 41.0  40.0  42.3   Platelets 150 - 400 K/uL 198  251  230        Latest Ref Rng & Units 09/28/2022    2:33 PM 07/15/2022    2:40 PM 10/23/2021    3:55 PM  CMP  Glucose 70 - 99 mg/dL 409  96  71   BUN 8 - 23 mg/dL 21  18  16    Creatinine 0.44 - 1.00 mg/dL 8.11  9.14  7.82   Sodium 135 - 145 mmol/L 141  141  141   Potassium 3.5 - 5.1 mmol/L 5.6  4.7  4.8   Chloride 98 - 111 mmol/L 106  104  105   CO2 22 - 32 mmol/L 26  26  27    Calcium 8.9 - 10.3 mg/dL 9.2  9.2  9.5   Total Protein 6.5 - 8.1 g/dL 7.4  6.7    Total Bilirubin 0.3 - 1.2 mg/dL 0.5  0.5    Alkaline Phos 38 - 126 U/L 62     AST 15 - 41 U/L 28  13    ALT 0 - 44 U/L 14  11     ASSESSMENT & PLAN  Miyu Demarzo is a 77 y.o. female with a history of DVT x 2 who is referred to our office to discuss her anticoagulation use for upcoming dental  surgery.  Encounter Diagnoses  Name Primary?   Long term current use of anticoagulant therapy Yes   History of DVT (deep vein thrombosis)     Long Term Use of Anticoagulants: Patient is on Xarelto and asa for a history of distant DVT and more recent DVT. She is a poor historian. Given her history of ETOH dependence I feel that it would be safer for her to be on monotherapy with the Xeralto alone to avoid GI bleed, excessive bleeding in the event of fall/injury. CBC/CMP reviewed today. Low potassium diet at this time given hypokalemia found on CMP. In terms of her upcoming dental procedure I agree that ideally she would wait until she has been on the [facility] for 3  months before bridging this rx. I have asked that she find out the name of the procedure so we can known better plan for her medication management.    RTC 1 month APP, labs ( CBC, CMP) to touch base and see if she or PCP has any further questions.   Orders Placed This Encounter  Procedures   CBC with Differential (Cancer Center Only)    Standing Status:   Future    Number of Occurrences:   1    Standing Expiration Date:   09/28/2023   CMP (Cancer Center only)    Standing Status:   Future    Number of Occurrences:   1    Standing Expiration Date:   09/28/2023    All questions were answered. The patient knows to call the clinic with any problems, questions or concerns.  I have spent a total of 30 minutes minutes of face-to-face and non-face-to-face time, preparing to see the patient, obtaining and/or reviewing separately obtained history, performing a medically appropriate examination, counseling and educating the patient, ordering medications/tests/procedures, referring and communicating with other health care professionals, documenting clinical information in the electronic health record, independently interpreting results and communicating results to the patient, and care coordination.    Clent Jacks PA-C Department of Hematology/Oncology [facility] at [facility]

## 2022-09-29 ENCOUNTER — Encounter: Payer: Self-pay | Admitting: Gastroenterology

## 2022-10-05 ENCOUNTER — Ambulatory Visit (INDEPENDENT_AMBULATORY_CARE_PROVIDER_SITE_OTHER): Payer: Medicare Other | Admitting: Family

## 2022-10-05 VITALS — BP 101/52 | HR 97 | Temp 98.7°F | Wt 164.0 lb

## 2022-10-05 DIAGNOSIS — K219 Gastro-esophageal reflux disease without esophagitis: Secondary | ICD-10-CM | POA: Diagnosis not present

## 2022-10-05 DIAGNOSIS — E875 Hyperkalemia: Secondary | ICD-10-CM

## 2022-10-05 DIAGNOSIS — H8111 Benign paroxysmal vertigo, right ear: Secondary | ICD-10-CM | POA: Diagnosis not present

## 2022-10-05 DIAGNOSIS — F32A Depression, unspecified: Secondary | ICD-10-CM

## 2022-10-05 DIAGNOSIS — R634 Abnormal weight loss: Secondary | ICD-10-CM

## 2022-10-05 DIAGNOSIS — Z86718 Personal history of other venous thrombosis and embolism: Secondary | ICD-10-CM

## 2022-10-05 DIAGNOSIS — L819 Disorder of pigmentation, unspecified: Secondary | ICD-10-CM | POA: Diagnosis not present

## 2022-10-05 MED ORDER — CITALOPRAM HYDROBROMIDE 40 MG PO TABS: 40.0000 mg | ORAL_TABLET | Freq: Every day | ORAL | 2 refills | Status: DC

## 2022-10-05 NOTE — Assessment & Plan Note (Signed)
Continues xarelto- plan for lifelong anticoagulation per hematology.

## 2022-10-05 NOTE — Assessment & Plan Note (Signed)
Labs as ordered.  She has been eating less due to GI complaints which she is happy about.

## 2022-10-05 NOTE — Assessment & Plan Note (Signed)
Suboptimal response to Cymbalta with reported feelings of being "dopey" and "slowed down". Patient prefers previous medication, Citalopram. -Discontinue Cymbalta. -Resume Citalopram 40mg  daily (previous dose). -Refer to Psychiatry for further management.

## 2022-10-05 NOTE — Progress Notes (Signed)
Subjective:     Patient ID: Judy Weiss, female    DOB: 28-Jul-1945, 77 y.o.   MRN: [id number]  Chief Complaint  Patient presents with   Weight Loss    Patient complains of losing weight   Depression    Here for follow up    HPI  Discussed the use of AI scribe software for clinical note transcription with the patient, who gave verbal consent to proceed.  History of Present Illness     The patient, with a history of depression, vertigo, and gastrointestinal issues, presents with dissatisfaction with their current antidepressant, Cymbalta, which they report makes them feel 'dopey' and 'slowed down.' She has reduced the dose from two pills to one, but still does not like the effects. She reports feeling better on their previous medication, citalopram.  The patient also reports ongoing vertigo, which is somewhat relieved by antihistamines. Overall vertigo is improving. She suspects it may be related to allergies.   The patient has been experiencing gastrointestinal issues, including vomiting almost daily. She describes it as not coming from the stomach, but rather from the throat, and it often occurs a few hours after eating. She reports a sensation of a 'knot' in their throat. She has stopped taking pantoprazole, as she did not feel it was helping. She has an upcoming appointment with a GI doctor.  The patient also has a history of recurrent DVT.  She saw Hematology who recommended lifelong anticoagulation. She reports some confusion about how long she should stay on this medication, as she has received differing advice from different doctors. She has a follow-up appointment scheduled with hematology. I advised her to follow hematology recommendations.   The patient has lost weight over the past year, which she attributes in part to their gastrointestinal issues. ' She drinks a small amount of hard cider daily, usually with dinner and sometimes before bed.     Wt Readings from Last 3  Encounters:  10/05/22 164 lb (74.4 kg)  09/28/22 164 lb (74.4 kg)  08/10/22 169 lb (76.7 kg)        Health Maintenance Due  Topic Date Due   Hepatitis C Screening  Never done   DTaP/Tdap/Td (1 - Tdap) Never done   Zoster Vaccines- Shingrix (1 of 2) Never done   COVID-19 Vaccine (6 - 2023-24 season) 12/18/2021    Past Medical History:  Diagnosis Date   Allergy    food allergies and some additives. But she does not know what will cause.   Depression    GERD (gastroesophageal reflux disease)    had in past but now controlled. Has hiatal hernia.   Heart murmur    History of blood clots 1996?   left leg   History of colon polyps    History of hiatal hernia    Hyperlipidemia     Past Surgical History:  Procedure Laterality Date   APPENDECTOMY  1972   DILATION AND CURETTAGE OF UTERUS     HIATAL HERNIA REPAIR  2016   INSERTION OF MESH N/A 01/09/2016   Procedure: INSERTION OF MESH;  Surgeon: Axel Filler, MD;  Location: [facility];  Service: General;  Laterality: N/A;   KNEE SURGERY Bilateral    hx of torn meniscus (arthroscopic surgery)   LEG SURGERY Left 2001   Had veins stripped    SHOULDER SURGERY Right    reports hx arthroscopic surgery    Family History  Problem Relation Age of Onset   Diabetes Mother  Hypothyroidism Mother    Seizures Mother     Social History   Socioeconomic History   Marital status: Divorced    Spouse name: Not on file   Number of children: Not on file   Years of education: Not on file   Highest education level: Associate degree: occupational, Scientist, product/process development, or vocational program  Occupational History   Not on file  Tobacco Use   Smoking status: Never   Smokeless tobacco: Never  Vaping Use   Vaping Use: Never used  Substance and Sexual Activity   Alcohol use: Yes    Comment: 2 cans "hard cider"/day   Drug use: No   Sexual activity: Not on file  Other Topics Concern   Not on file  Social History Narrative   Lives alone- can  walk to her daughter's home   Daughter in [place]   Son in [place]   Son [place]   4 grandchildren (twin girls age 81)   Retired- Presenter, broadcasting at a Associate Professor x 25 years.   Divorced   4 cats and a dog   Enjoys antiquing.  Buys and sells.  Enjoys shopping   Social Determinants of Health   Financial Resource Strain: Low Risk  (08/08/2022)   Overall Financial Resource Strain (CARDIA)    Difficulty of Paying Living Expenses: Not very hard  Food Insecurity: No Food Insecurity (08/08/2022)   Hunger Vital Sign    Worried About Running Out of Food in the Last Year: Never true    Ran Out of Food in the Last Year: Never true  Transportation Needs: No Transportation Needs (08/08/2022)   PRAPARE - Administrator, Civil Service (Medical): No    Lack of Transportation (Non-Medical): No  Physical Activity: Insufficiently Active (08/08/2022)   Exercise Vital Sign    Days of Exercise per Week: 1 day    Minutes of Exercise per Session: 20 min  Stress: No Stress Concern Present (08/08/2022)   [employer] of Occupational Health - Occupational Stress Questionnaire    Feeling of Stress : Only a little  Social Connections: Socially Isolated (08/08/2022)   Social Connection and Isolation Panel [NHANES]    Frequency of Communication with Friends and Family: Once a week    Frequency of Social Gatherings with Friends and Family: Once a week    Attends Religious Services: Never    Database administrator or Organizations: No    Attends Engineer, structural: Not on file    Marital Status: Divorced  Intimate Partner Violence: Not At Risk (06/18/2022)   Humiliation, Afraid, Rape, and Kick questionnaire    Fear of Current or Ex-Partner: No    Emotionally Abused: No    Physically Abused: No    Sexually Abused: No    Outpatient Medications Prior to Visit  Medication Sig Dispense Refill   aspirin EC 81 MG tablet Take 1 tablet (81 mg total) by mouth daily. (Patient  taking differently: Take 162 mg by mouth daily. Take 2 tablets by mouth daily.)     buPROPion (WELLBUTRIN XL) 300 MG 24 hr tablet Take 1 tablet (300 mg total) by mouth every morning. 90 tablet 1   COVID-19 mRNA bivalent vaccine, Pfizer, (PFIZER COVID-19 VAC BIVALENT) injection Inject into the muscle. 0.3 mL 0   cyanocobalamin 1000 MCG tablet Take 1,000 mcg by mouth daily.     rivaroxaban (XARELTO) 20 MG TABS tablet TAKE 1 TABLET(20 MG) BY MOUTH DAILY WITH SUPPER 30 tablet  1   vitamin B-12 (CYANOCOBALAMIN) 500 MCG tablet Take 500 mcg by mouth daily.     VITAMIN D PO Take 2,000 Units by mouth daily.     zolpidem (AMBIEN) 5 MG tablet TAKE 1 TABLET BY MOUTH EVERY NIGHT AT BEDTIME AS NEEDED FOR SLEEP 30 tablet 0   DULoxetine (CYMBALTA) 30 MG capsule Take 1 capsule by mouth once daily for 3 days,then increase to 2 capsules once daily. 60 capsule 1   pantoprazole (PROTONIX) 40 MG tablet TAKE 1 TABLET(40 MG) BY MOUTH DAILY 90 tablet 1   No facility-administered medications prior to visit.    No Known Allergies  ROS See HPI    Objective:    Physical Exam Constitutional:      General: She is not in acute distress.    Appearance: Normal appearance. She is well-developed.  HENT:     Head: Normocephalic and atraumatic.     Right Ear: External ear normal.     Left Ear: External ear normal.  Eyes:     General: No scleral icterus. Neck:     Thyroid: No thyromegaly.  Cardiovascular:     Rate and Rhythm: Normal rate and regular rhythm.     Heart sounds: Normal heart sounds. No murmur heard. Pulmonary:     Effort: Pulmonary effort is normal. No respiratory distress.     Breath sounds: Normal breath sounds. No wheezing.  Musculoskeletal:     Cervical back: Neck supple.  Skin:    General: Skin is warm and dry.  Neurological:     Mental Status: She is alert and oriented to person, place, and time.  Psychiatric:        Mood and Affect: Mood normal.        Behavior: Behavior normal.         Thought Content: Thought content normal.        Judgment: Judgment normal.      BP (!) 101/52 (BP Location: Right Arm, Patient Position: Sitting, Cuff Size: Small)   Pulse 97   Temp 98.7 F (37.1 C) (Oral)   Wt 164 lb (74.4 kg)   SpO2 98%   BMI 29.05 kg/m  Wt Readings from Last 3 Encounters:  10/05/22 164 lb (74.4 kg)  09/28/22 164 lb (74.4 kg)  08/10/22 169 lb (76.7 kg)       Assessment & Plan:   Problem List Items Addressed This Visit       Unprioritized   Weight loss    Labs as ordered.  She has been eating less due to GI complaints which she is happy about.       History of DVT (deep vein thrombosis)    Continues xarelto- plan for lifelong anticoagulation per hematology.       GERD (gastroesophageal reflux disease) s/p Nissen fundoplication 01/09/2016    Uncontrolled. Declines protonix as it was not helping.  Encouraged small meals.       Discoloration of skin of foot    Vascular surgeon felt that this was due to venous insufficiency.       Depression - Primary    Suboptimal response to Cymbalta with reported feelings of being "dopey" and "slowed down". Patient prefers previous medication, Citalopram. -Discontinue Cymbalta. -Resume Citalopram 40mg  daily (previous dose). -Refer to Psychiatry for further management.      Relevant Medications   citalopram (CELEXA) 40 MG tablet   Other Relevant Orders   Ambulatory referral to Psychiatry   Benign paroxysmal positional vertigo of right  ear    Possible allergy-related symptoms. Some relief with antihistamines. -Continue current management.      Other Visit Diagnoses     Hyperkalemia       Relevant Orders   Basic Metabolic Panel (BMET)       I have discontinued Ocia Macneal's pantoprazole and DULoxetine. I am also having her start on citalopram. Additionally, I am having her maintain her aspirin EC, cyanocobalamin, VITAMIN D PO, Pfizer COVID-19 Vac Bivalent, cyanocobalamin, buPROPion, zolpidem, and  Xarelto.  Meds ordered this encounter  Medications   citalopram (CELEXA) 40 MG tablet    Sig: Take 1 tablet (40 mg total) by mouth daily.    Dispense:  30 tablet    Refill:  2    Order Specific Question:   Supervising Provider    Answer:   Danise Edge A [id number]

## 2022-10-05 NOTE — Assessment & Plan Note (Signed)
Possible allergy-related symptoms. Some relief with antihistamines. -Continue current management.

## 2022-10-05 NOTE — Assessment & Plan Note (Signed)
Vascular surgeon felt that this was due to venous insufficiency.

## 2022-10-05 NOTE — Assessment & Plan Note (Signed)
Uncontrolled. Declines protonix as it was not helping.  Encouraged small meals.

## 2022-10-06 LAB — BASIC METABOLIC PANEL
BUN: 22 mg/dL (ref 6–23)
CO2: 27 mEq/L (ref 19–32)
Calcium: 9.5 mg/dL (ref 8.4–10.5)
Chloride: 105 mEq/L (ref 96–112)
Creatinine, Ser: 0.86 mg/dL (ref 0.40–1.20)
GFR: 64.26 mL/min (ref >60.00)
Glucose, Bld: 123 mg/dL — ABNORMAL HIGH (ref 70–99)
Potassium: 4.4 mEq/L (ref 3.5–5.1)
Sodium: 142 mEq/L (ref 135–145)

## 2022-10-20 ENCOUNTER — Telehealth: Payer: Self-pay | Admitting: Family

## 2022-10-20 MED ORDER — BUPROPION HCL ER (XL) 300 MG PO TB24: 300.0000 mg | ORAL_TABLET | Freq: Every morning | ORAL | 1 refills | Status: DC

## 2022-10-20 NOTE — Addendum Note (Signed)
Addended byConrad Orwigs[place] D on: 10/20/2022 02:19 PM   Modules accepted: Orders

## 2022-10-20 NOTE — Telephone Encounter (Signed)
Pt said that her wellbutrin prescription was denied and she wanted to know why. Pt was seen on 10/05/22. She said that she wasn't told to stop taking it so she kept taking it. Please call patient to advise. If appropriate, please send to [facility] in [place]

## 2022-10-20 NOTE — Telephone Encounter (Signed)
Chart reviewed, I don't see that we have received a refill request. Rx sent.

## 2022-10-26 ENCOUNTER — Telehealth: Payer: Self-pay | Admitting: Family

## 2022-10-26 NOTE — Telephone Encounter (Signed)
Chart reviewed (they are calling about a form completed in May 2024?). Form re-faxed.

## 2022-10-26 NOTE — Telephone Encounter (Signed)
Judy Weiss Advertising copywriter) called stating that they needed clarification on document that was faxed to them regarding her procedure. Rep stated fax was cut off an some info was lost in transmission. Rep is requesting clarification in a call back or refaxing the document to them at:  F: [phone]

## 2022-10-26 NOTE — Progress Notes (Unsigned)
Chief Complaint: Primary GI MD: Dr. Chales Abrahams  HPI: 77 year old female history of hiatal hernia s/p Nissan fundoplication 2017, DVT, presents for evaluation of  Last seen in 2019 by Dr. Chales Abrahams for esophageal dysphagia.  Screening colonoscopy was put off due to patient not being able to tolerate prep secondary to her dysphagia symptoms.  No previous colonoscopy      PREVIOUS GI WORKUP   EGD 11/17/2017 for dysphagia after barium swallow showed distal esophageal stricture: Torturous esophagus, benign-appearing esophageal stenosis due to tight Nissen's fundoplication-dilated.  Gastritis (negative for H. pylori).  Limited examination due to retained food in the stomach.  No gastric outlet obstruction  Past Medical History:  Diagnosis Date   Allergy    food allergies and some additives. But she does not know what will cause.   Depression    GERD (gastroesophageal reflux disease)    had in past but now controlled. Has hiatal hernia.   Heart murmur    History of blood clots 1996?   left leg   History of colon polyps    History of hiatal hernia    Hyperlipidemia     Past Surgical History:  Procedure Laterality Date   APPENDECTOMY  1972   DILATION AND CURETTAGE OF UTERUS     HIATAL HERNIA REPAIR  2016   INSERTION OF MESH N/A 01/09/2016   Procedure: INSERTION OF MESH;  Surgeon: Axel Filler, MD;  Location: [facility];  Service: General;  Laterality: N/A;   KNEE SURGERY Bilateral    hx of torn meniscus (arthroscopic surgery)   LEG SURGERY Left 2001   Had veins stripped    SHOULDER SURGERY Right    reports hx arthroscopic surgery    Current Outpatient Medications  Medication Sig Dispense Refill   aspirin EC 81 MG tablet Take 1 tablet (81 mg total) by mouth daily. (Patient taking differently: Take 162 mg by mouth daily. Take 2 tablets by mouth daily.)     buPROPion (WELLBUTRIN XL) 300 MG 24 hr tablet Take 1 tablet (300 mg total) by mouth every morning. 90 tablet 1   citalopram (CELEXA)  40 MG tablet Take 1 tablet (40 mg total) by mouth daily. 30 tablet 2   COVID-19 mRNA bivalent vaccine, Pfizer, (PFIZER COVID-19 VAC BIVALENT) injection Inject into the muscle. 0.3 mL 0   cyanocobalamin 1000 MCG tablet Take 1,000 mcg by mouth daily.     rivaroxaban (XARELTO) 20 MG TABS tablet TAKE 1 TABLET(20 MG) BY MOUTH DAILY WITH SUPPER 30 tablet 1   vitamin B-12 (CYANOCOBALAMIN) 500 MCG tablet Take 500 mcg by mouth daily.     VITAMIN D PO Take 2,000 Units by mouth daily.     zolpidem (AMBIEN) 5 MG tablet TAKE 1 TABLET BY MOUTH EVERY NIGHT AT BEDTIME AS NEEDED FOR SLEEP 30 tablet 0   No current facility-administered medications for this visit.    Allergies as of 10/27/2022   (No Known Allergies)    Family History  Problem Relation Age of Onset   Diabetes Mother    Hypothyroidism Mother    Seizures Mother     Social History   Socioeconomic History   Marital status: Divorced    Spouse name: Not on file   Number of children: Not on file   Years of education: Not on file   Highest education level: Associate degree: occupational, Scientist, product/process development, or vocational program  Occupational History   Not on file  Tobacco Use   Smoking status: Never   Smokeless tobacco:  Never  Vaping Use   Vaping Use: Never used  Substance and Sexual Activity   Alcohol use: Yes    Comment: 2 cans "hard cider"/day   Drug use: No   Sexual activity: Not on file  Other Topics Concern   Not on file  Social History Narrative   Lives alone- can walk to her daughter's home   Daughter in [place]   Son in [place]   Son [place]   4 grandchildren (twin girls age 85)   Retired- Presenter, broadcasting at a Associate Professor x 25 years.   Divorced   4 cats and a dog   Enjoys antiquing.  Buys and sells.  Enjoys shopping   Social Determinants of Health   Financial Resource Strain: Low Risk  (08/08/2022)   Overall Financial Resource Strain (CARDIA)    Difficulty of Paying Living Expenses: Not very hard  Food  Insecurity: No Food Insecurity (08/08/2022)   Hunger Vital Sign    Worried About Running Out of Food in the Last Year: Never true    Ran Out of Food in the Last Year: Never true  Transportation Needs: No Transportation Needs (08/08/2022)   PRAPARE - Administrator, Civil Service (Medical): No    Lack of Transportation (Non-Medical): No  Physical Activity: Insufficiently Active (08/08/2022)   Exercise Vital Sign    Days of Exercise per Week: 1 day    Minutes of Exercise per Session: 20 min  Stress: No Stress Concern Present (08/08/2022)   [employer] of Occupational Health - Occupational Stress Questionnaire    Feeling of Stress : Only a little  Social Connections: Socially Isolated (08/08/2022)   Social Connection and Isolation Panel [NHANES]    Frequency of Communication with Friends and Family: Once a week    Frequency of Social Gatherings with Friends and Family: Once a week    Attends Religious Services: Never    Database administrator or Organizations: No    Attends Engineer, structural: Not on file    Marital Status: Divorced  Intimate Partner Violence: Not At Risk (06/18/2022)   Humiliation, Afraid, Rape, and Kick questionnaire    Fear of Current or Ex-Partner: No    Emotionally Abused: No    Physically Abused: No    Sexually Abused: No    Review of Systems:    Constitutional: No weight loss, fever, chills, weakness or fatigue HEENT: Eyes: No change in vision               Ears, Nose, Throat:  No change in hearing or congestion Skin: No rash or itching Cardiovascular: No chest pain, chest pressure or palpitations   Respiratory: No SOB or cough Gastrointestinal: See HPI and otherwise negative Genitourinary: No dysuria or change in urinary frequency Neurological: No headache, dizziness or syncope Musculoskeletal: No new muscle or joint pain Hematologic: No bleeding or bruising Psychiatric: No history of depression or anxiety    Physical Exam:   Vital signs: There were no vitals taken for this visit.  Constitutional: NAD, Well developed, Well nourished, alert and cooperative Head:  Normocephalic and atraumatic. Eyes:   PEERL, EOMI. No icterus. Conjunctiva pink. Respiratory: Respirations even and unlabored. Lungs clear to auscultation bilaterally.   No wheezes, crackles, or rhonchi.  Cardiovascular:  Regular rate and rhythm. No peripheral edema, cyanosis or pallor.  Gastrointestinal:  Soft, nondistended, nontender. No rebound or guarding. Normal bowel sounds. No appreciable masses or hepatomegaly. Rectal:  Not performed.  Msk:  Symmetrical without gross deformities. Without edema, no deformity or joint abnormality.  Neurologic:  Alert and  oriented x4;  grossly normal neurologically.  Skin:   Dry and intact without significant lesions or rashes. Psychiatric: Oriented to person, place and time. Demonstrates good judgement and reason without abnormal affect or behaviors.   RELEVANT LABS AND IMAGING: CBC    Component Value Date/Time   WBC 5.2 09/28/2022 1433   WBC 4.9 07/15/2022 1440   RBC 4.55 09/28/2022 1433   HGB 13.0 09/28/2022 1433   HCT 41.0 09/28/2022 1433   PLT 198 09/28/2022 1433   MCV 90.1 09/28/2022 1433   MCH 28.6 09/28/2022 1433   MCHC 31.7 09/28/2022 1433   RDW 12.4 09/28/2022 1433   LYMPHSABS 1.5 09/28/2022 1433   MONOABS 0.4 09/28/2022 1433   EOSABS 0.1 09/28/2022 1433   BASOSABS 0.0 09/28/2022 1433    CMP     Component Value Date/Time   NA 142 10/05/2022 1645   K 4.4 10/05/2022 1645   CL 105 10/05/2022 1645   CO2 27 10/05/2022 1645   GLUCOSE 123 (H) 10/05/2022 1645   BUN 22 10/05/2022 1645   CREATININE 0.86 10/05/2022 1645   CREATININE 1.00 09/28/2022 1433   CREATININE 0.78 07/15/2022 1440   CALCIUM 9.5 10/05/2022 1645   PROT 7.4 09/28/2022 1433   ALBUMIN 4.0 09/28/2022 1433   AST 28 09/28/2022 1433   ALT 14 09/28/2022 1433   ALKPHOS 62 09/28/2022 1433   BILITOT 0.5 09/28/2022 1433    GFRNONAA 57 (L) 09/28/2022 1433   GFRAA >60 12/13/2016 1533    Assessment: 1. ***  Plan: 1. ***     Lara Mulch [facility] 10/26/2022, 11:34 AM  Cc: Sandford Craze, NP

## 2022-10-27 ENCOUNTER — Ambulatory Visit: Payer: Medicare Other | Admitting: Gastroenterology

## 2022-10-27 ENCOUNTER — Encounter: Payer: Self-pay | Admitting: Gastroenterology

## 2022-10-27 VITALS — BP 118/68 | Ht 62.0 in | Wt 161.0 lb

## 2022-10-27 DIAGNOSIS — R1310 Dysphagia, unspecified: Secondary | ICD-10-CM

## 2022-10-27 DIAGNOSIS — R634 Abnormal weight loss: Secondary | ICD-10-CM | POA: Diagnosis not present

## 2022-10-27 DIAGNOSIS — Z1211 Encounter for screening for malignant neoplasm of colon: Secondary | ICD-10-CM | POA: Diagnosis not present

## 2022-10-27 MED ORDER — PANTOPRAZOLE SODIUM 40 MG PO TBEC: 40.0000 mg | DELAYED_RELEASE_TABLET | Freq: Every day | ORAL | 2 refills | Status: DC

## 2022-10-27 NOTE — Patient Instructions (Addendum)
  If your blood pressure at your visit was 140/90 or greater, please contact your primary care physician to follow up on this.   If you are age 79 or older, your body mass index should be between 23-30. Your Body mass index is 29.45 kg/m. If this is out of the aforementioned range listed, please consider follow up with your Primary Care Provider.  If you are age 74 or younger, your body mass index should be between 19-25. Your Body mass index is 29.45 kg/m. If this is out of the aformentioned range listed, please consider follow up with your Primary Care Provider.   You have been scheduled for a CT scan of the abdomen and pelvis at [facility], 1st floor Radiology. You are scheduled on 11/10/22 at 11:30am. You should arrive 15 minutes prior to your appointment time for registration.   Please follow the written instructions below on the day of your exam:   1) Do not eat anything after 7:30am (4 hours prior to your test)    You may take any medications as prescribed with a small amount of water, if necessary. If you take any of the following medications: METFORMIN, GLUCOPHAGE, GLUCOVANCE, AVANDAMET, RIOMET, FORTAMET, ACTOPLUS MET, JANUMET, GLUMETZA or METAGLIP, you MAY be asked to HOLD this medication 48 hours AFTER the exam.   If you have any questions regarding your exam or if you need to reschedule, you may call [facility] at [phone] between the hours of 8:00 am and 5:00 pm, Monday-Friday.    The [facility] providers would like to encourage you to use [facility] to communicate with providers for non-urgent requests or questions.  Due to long hold times on the telephone, sending your provider a message by [facility] may be a faster and more efficient way to get a response.  Please allow 48 business hours for a response.  Please remember that this is for non-urgent requests.   It was a pleasure to see you today!  Thank you for trusting me with your gastrointestinal care!     Bayley McMicheal, PA-C  _______________________________________________________

## 2022-10-28 ENCOUNTER — Encounter: Payer: Self-pay | Admitting: Medical Oncology

## 2022-10-28 ENCOUNTER — Other Ambulatory Visit: Payer: Self-pay | Admitting: Medical Oncology

## 2022-10-28 ENCOUNTER — Inpatient Hospital Stay: Payer: Medicare Other | Admitting: Medical Oncology

## 2022-10-28 ENCOUNTER — Inpatient Hospital Stay: Payer: Medicare Other | Attending: Medical Oncology

## 2022-10-28 ENCOUNTER — Other Ambulatory Visit: Payer: Self-pay

## 2022-10-28 VITALS — BP 107/69 | HR 73 | Temp 98.1°F | Resp 18 | Ht 62.0 in | Wt 161.4 lb

## 2022-10-28 DIAGNOSIS — Z79899 Other long term (current) drug therapy: Secondary | ICD-10-CM | POA: Insufficient documentation

## 2022-10-28 DIAGNOSIS — Z7901 Long term (current) use of anticoagulants: Secondary | ICD-10-CM

## 2022-10-28 DIAGNOSIS — Z86718 Personal history of other venous thrombosis and embolism: Secondary | ICD-10-CM | POA: Diagnosis not present

## 2022-10-28 DIAGNOSIS — Z7982 Long term (current) use of aspirin: Secondary | ICD-10-CM | POA: Diagnosis not present

## 2022-10-28 LAB — CMP (CANCER CENTER ONLY)
ALT: 9 U/L (ref 0–44)
AST: 13 U/L — ABNORMAL LOW (ref 15–41)
Albumin: 4.4 g/dL (ref 3.5–5.0)
Alkaline Phosphatase: 61 U/L (ref 38–126)
Anion gap: 9 (ref 5–15)
BUN: 26 mg/dL — ABNORMAL HIGH (ref 8–23)
CO2: 30 mmol/L (ref 22–32)
Calcium: 9.9 mg/dL (ref 8.9–10.3)
Chloride: 105 mmol/L (ref 98–111)
Creatinine: 0.93 mg/dL (ref 0.44–1.00)
GFR, Estimated: >60 mL/min (ref >60)
Glucose, Bld: 150 mg/dL — ABNORMAL HIGH (ref 70–99)
Potassium: 4.8 mmol/L (ref 3.5–5.1)
Sodium: 144 mmol/L (ref 135–145)
Total Bilirubin: 0.5 mg/dL (ref 0.3–1.2)
Total Protein: 7.2 g/dL (ref 6.5–8.1)

## 2022-10-28 LAB — CBC WITH DIFFERENTIAL (CANCER CENTER ONLY)
Abs Immature Granulocytes: 0.01 10*3/uL (ref 0.00–0.07)
Basophils Absolute: 0 10*3/uL (ref 0.0–0.1)
Basophils Relative: 0 %
Eosinophils Absolute: 0.2 10*3/uL (ref 0.0–0.5)
Eosinophils Relative: 4 %
HCT: 41.8 % (ref 36.0–46.0)
Hemoglobin: 13.2 g/dL (ref 12.0–15.0)
Immature Granulocytes: 0 %
Lymphocytes Relative: 34 %
Lymphs Abs: 1.7 10*3/uL (ref 0.7–4.0)
MCH: 28.9 pg (ref 26.0–34.0)
MCHC: 31.6 g/dL (ref 30.0–36.0)
MCV: 91.5 fL (ref 80.0–100.0)
Monocytes Absolute: 0.3 10*3/uL (ref 0.1–1.0)
Monocytes Relative: 6 %
Neutro Abs: 2.7 10*3/uL (ref 1.7–7.7)
Neutrophils Relative %: 56 %
Platelet Count: 181 10*3/uL (ref 150–400)
RBC: 4.57 MIL/uL (ref 3.87–5.11)
RDW: 12.6 % (ref 11.5–15.5)
WBC Count: 4.9 10*3/uL (ref 4.0–10.5)
nRBC: 0 % (ref 0.0–0.2)

## 2022-10-28 NOTE — Progress Notes (Signed)
Hematology and Oncology Follow Up Visit  Shakaria Raphael [id number] 1945-07-31 77 y.o. 10/28/2022  Past Medical History:  Diagnosis Date   Allergy    food allergies and some additives. But she does not know what will cause.   Depression    GERD (gastroesophageal reflux disease)    had in past but now controlled. Has hiatal hernia.   Heart murmur    History of blood clots 1996?   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 Woulfe is back for follow-up for advisement of her anticoagulant use. This is her second visit to our office. She was referred to our office initially for advisement of bridging her xeralto for a non emergent dental procedure. Since this procedure was not emergent and she had not been on xeralto for 3 months yet it was advised that she wait. Given her poor historian status, she was asked to return today for labs, to touch base and see if she or her PCP have any additional questions:  Today she reports that she is doing well. She is taking the [facility] as prescribed. She reports that she has an upcoming endoscopy planned by GI. She has not scheduled her dental extraction yet. She reports no pain of the tooth and her dentist said this could stay in if needed for now. She has not had any bleeding or bruising episodes.   Wt Readings from Last 3 Encounters:  10/28/22 161 lb 6.4 oz (73.2 kg)  10/27/22 161 lb (73 kg)  10/05/22 164 lb (74.4 kg)     Medications:   Current Outpatient Medications:    aspirin EC 81 MG tablet, Take 1 tablet (81 mg total) by mouth daily. (Patient taking differently: Take 162 mg by mouth daily. Take 2 tablets by mouth daily.), Disp: , Rfl:    buPROPion (WELLBUTRIN XL) 300 MG 24 hr tablet, Take 1 tablet (300  mg total) by mouth every morning., Disp: 90 tablet, Rfl: 1   citalopram (CELEXA) 40 MG tablet, Take 1 tablet (40 mg total) by mouth daily., Disp: 30 tablet, Rfl: 2   COVID-19 mRNA bivalent vaccine, Pfizer, (PFIZER COVID-19 VAC BIVALENT) injection, Inject into the muscle., Disp: 0.3 mL, Rfl: 0   cyanocobalamin 1000 MCG tablet, Take 1,000 mcg by mouth daily., Disp: , Rfl:    pantoprazole (PROTONIX) 40 MG tablet, Take 1 tablet (40 mg total) by mouth daily., Disp: 30 tablet, Rfl: 2   rivaroxaban (XARELTO) 20 MG TABS tablet, TAKE 1 TABLET(20 MG) BY MOUTH DAILY WITH SUPPER, Disp: 30 tablet, Rfl: 1   vitamin B-12 (CYANOCOBALAMIN) 500 MCG tablet, Take 500 mcg by mouth daily., Disp: , Rfl:    VITAMIN D PO, Take 2,000 Units by mouth daily., Disp: , Rfl:    zolpidem (AMBIEN) 5 MG tablet, TAKE 1 TABLET BY MOUTH EVERY NIGHT AT BEDTIME AS NEEDED FOR SLEEP, Disp: 30 tablet, Rfl: 0  Allergies: No Known Allergies  Past Medical History, Surgical history, Social history, and Family History were reviewed and updated.  Review of Systems: Review of Systems  Constitutional:  Positive for unexpected weight change. Negative for appetite change.  HENT:   Negative for trouble swallowing.   Respiratory:  Negative for cough and hemoptysis.   Gastrointestinal:  Negative for blood in stool.  Genitourinary:  Negative for hematuria, vaginal bleeding and vaginal discharge.   Hematological:  Does not bruise/bleed easily.   Physical Exam:  height is 5\' 2"  (1.575 m) and weight is 161 lb 6.4 oz (73.2 kg). Her oral temperature is 98.1 F (36.7 C). Her blood pressure is 107/69 and her pulse is 73. Her respiration is 18 and oxygen saturation is 100%.   Physical Exam General: NAD Cardiovascular: regular rate and rhythm Pulmonary: clear ant fields Abdomen: soft, nontender, + bowel sounds GU: no suprapubic tenderness Extremities: no edema, no joint deformities Skin: no rashes Neurological: Weakness but otherwise  nonfocal   Lab Results  Component Value Date   WBC 4.9 10/28/2022   HGB 13.2 10/28/2022   HCT 41.8 10/28/2022   MCV 91.5 10/28/2022   PLT 181 10/28/2022     Chemistry      Component Value Date/Time   NA 144 10/28/2022 1111   K 4.8 10/28/2022 1111   CL 105 10/28/2022 1111   CO2 30 10/28/2022 1111   BUN 26 (H) 10/28/2022 1111   CREATININE 0.93 10/28/2022 1111   CREATININE 0.78 07/15/2022 1440      Component Value Date/Time   CALCIUM 9.9 10/28/2022 1111   ALKPHOS 61 10/28/2022 1111   AST 13 (L) 10/28/2022 1111   ALT 9 10/28/2022 1111   BILITOT 0.5 10/28/2022 1111      Assessment and Plan- Patient is a 77 y.o. female    Encounter Diagnoses  Name Primary?   Long term current use of anticoagulant therapy Yes   History of DVT (deep vein thrombosis)     She appears to be a much better historian today. We discussed that given her two unprovoked DVTs I would suggest lifelong anticoagulation to prevent recurrence. She is ok with this. We also discussed Xeralto bridging for her dental/endoscopic procedure which would be holding Xeralto 2 days prior to the procedure and restarting 1-2 days after once hemostasis has been reached. Finally she requests a follow up doppler of her left leg where the DVT occurred in March. She is asymptomatic but wants to ensure that it has resolved. I will place [place] orders. She will follow up in 6 months or sooner as needed.   Disposition: US doppler lower left placed today RTC 6 months APP, labs ( CBC, CMP)-[place]   Clent Jacks PA-C 7/11/202412:54 PM

## 2022-11-10 ENCOUNTER — Ambulatory Visit (HOSPITAL_COMMUNITY)
Admission: RE | Admit: 2022-11-10 | Discharge: 2022-11-10 | Disposition: A | Discharge status: Home / Self Care | Payer: Medicare Other | Source: Ambulatory Visit | Attending: Gastroenterology | Admitting: Gastroenterology

## 2022-11-10 ENCOUNTER — Encounter (HOSPITAL_COMMUNITY): Payer: Self-pay

## 2022-11-10 DIAGNOSIS — R1310 Dysphagia, unspecified: Secondary | ICD-10-CM

## 2022-11-10 DIAGNOSIS — K5730 Diverticulosis of large intestine without perforation or abscess without bleeding: Secondary | ICD-10-CM | POA: Diagnosis not present

## 2022-11-10 DIAGNOSIS — R634 Abnormal weight loss: Secondary | ICD-10-CM

## 2022-11-10 DIAGNOSIS — K449 Diaphragmatic hernia without obstruction or gangrene: Secondary | ICD-10-CM | POA: Diagnosis not present

## 2022-11-10 MED ORDER — SODIUM CHLORIDE (PF) 0.9 % IJ SOLN
INTRAMUSCULAR | Status: AC
  Filled 2022-11-10: qty 50

## 2022-11-10 MED ORDER — IOHEXOL 300 MG/ML  SOLN
100.0000 mL | Freq: Once | INTRAMUSCULAR | Status: AC | PRN
  Administered 2022-11-10: 100 mL via INTRAVENOUS

## 2022-11-15 ENCOUNTER — Other Ambulatory Visit: Payer: Self-pay | Admitting: Family

## 2022-11-17 ENCOUNTER — Telehealth: Payer: Self-pay

## 2022-11-17 ENCOUNTER — Other Ambulatory Visit: Payer: Self-pay

## 2022-11-17 DIAGNOSIS — R1310 Dysphagia, unspecified: Secondary | ICD-10-CM

## 2022-11-17 NOTE — Telephone Encounter (Signed)
-----   Message from Legrand Como sent at 11/17/2022 10:09 AM EDT ----- Regarding: barium swallow Hey! Can we get this patient set up for barium swallow with tablet since unable to do EGD while on blood thinners? Thanks!

## 2022-11-17 NOTE — Telephone Encounter (Signed)
Order entered in epic. Rad scheduling to contact pt to schedule exam.

## 2022-12-01 ENCOUNTER — Ambulatory Visit (HOSPITAL_COMMUNITY)
Admission: RE | Admit: 2022-12-01 | Discharge: 2022-12-01 | Disposition: A | Discharge status: Home / Self Care | Payer: Medicare Other | Source: Ambulatory Visit | Attending: Gastroenterology | Admitting: Gastroenterology

## 2022-12-01 DIAGNOSIS — R1310 Dysphagia, unspecified: Secondary | ICD-10-CM | POA: Insufficient documentation

## 2022-12-01 DIAGNOSIS — K219 Gastro-esophageal reflux disease without esophagitis: Secondary | ICD-10-CM | POA: Diagnosis not present

## 2022-12-11 ENCOUNTER — Other Ambulatory Visit: Payer: Self-pay | Admitting: Family

## 2022-12-22 ENCOUNTER — Telehealth: Payer: Self-pay | Admitting: Family

## 2022-12-22 NOTE — Telephone Encounter (Signed)
If they feel that they can safely perform the extraction while on aspirin, I would like for her to continue the aspirin. If they feel that it must be discontinued, then I would recommend that she discontinue 7 days prior and resume immediately after the extraction.

## 2022-12-22 NOTE — Telephone Encounter (Signed)
Judy Weiss from [facility] called to speak with Judy Weiss regarding the pt's blood thinner medication. They would like to know the protocol/ guidelines for what the pt needs to do have her tooth removal/ Please call & advise at [phone].

## 2022-12-23 NOTE — Telephone Encounter (Signed)
Shasta from Dr.Devaney's office called back to go over Asprin recommendation. Windell Moulding was unavailable so advised her a note would be sent back to follow up. Her callback number is [phone]

## 2022-12-24 ENCOUNTER — Encounter (INDEPENDENT_AMBULATORY_CARE_PROVIDER_SITE_OTHER): Payer: Self-pay

## 2022-12-24 ENCOUNTER — Telehealth: Payer: Self-pay | Admitting: Gastroenterology

## 2022-12-24 NOTE — Telephone Encounter (Signed)
Sarah,  Can you please comment on her xarelto/ASA?  Should she be bridged with lovenox?

## 2022-12-24 NOTE — Telephone Encounter (Signed)
Inbound call from patient stating that Protonix is working, but believes it may be causing issues with constipation. Patient is requesting a call to discuss if there is a different medication she can take. Please advise.

## 2022-12-27 NOTE — Telephone Encounter (Signed)
Informed Shasta form Dr. Shannan Harper office of [facility] instructions on stopping Xarelto/Asprin  [facility] Student Marian Sorrow

## 2022-12-27 NOTE — Telephone Encounter (Signed)
Please contact pt's dental office and advise the following:  Holding Xarelto/Aspirin 2 days prior to the procedure and restarting 1-2 days after once hemostasis has been reached.

## 2022-12-27 NOTE — Telephone Encounter (Signed)
Left message for patient to call back  

## 2022-12-28 NOTE — Telephone Encounter (Signed)
Left message for patient to call back  

## 2022-12-29 NOTE — Telephone Encounter (Signed)
I have contacted the patient x 2 with no return call. Will await further response from patient if she continues to need our assistance with this matter.

## 2022-12-30 NOTE — Telephone Encounter (Signed)
Patient calls stating that she feels her pantoprazole prescription recently given to her at her visit with [place] is causing her to have severe constipation thought she states that it has been helping her reflux symptoms. I have advised patient that we typically see more patients that experience diarrhea from pantoprazole as a side effect, however, this does not at all mean constipation side effect would be completely out of the question. Patient says she actually stopped the pantoprazole about 1 week ago but continues with constipation. Of note: CT scan 10/2022 did show a moderate stool burden. I advised patient to begin miralax 1 capful daily. Patient states that she has been taking 3 doses per day recently with only a small amount of stool return. In this case, I have advised miralax bowel purge and following with 1-2 capfuls miralax daily thereafter. I have also suggested the use of benefiber daily, high fiber diet and increasing fluid intake to at least 64 oz water daily (patient admits she does not drink enough fluids). I asked that she call us back should these additions not be efficacious.  Bayley, thoughts on pantoprazole and constipation? Change in meds?

## 2023-01-03 NOTE — Telephone Encounter (Signed)
Left message for patient to call back  

## 2023-01-03 NOTE — Telephone Encounter (Signed)
  Legrand Como, PA-C   01/03/23  8:16 AM Odd she is having an acute side effect in pantoprazole since at last visit it was a refill - not new medication. Can try to send in omeprazole and see if she tolerates that better. Also, if no improvement after miralax purge we can start on Linzess. We also plan to do a colonoscopy when she is able to get off of her blood thinner so that is reassuring. Thanks so much!

## 2023-01-04 NOTE — Telephone Encounter (Signed)
PT is returning call. Please advise.

## 2023-01-04 NOTE — Telephone Encounter (Signed)
Patient would like to go forward with changing over to omeprazole in place of pantoprazole. Bayley, please provide sig for new rx omeprazole and I will send to pharmacy. In addition, patient states she continues with constipation, only moving bowels "a little bit". HOWEVER, patient has not completed bowel purge or changed miralax dosing as previously recommended. I again recommended she complete these steps.

## 2023-01-05 MED ORDER — OMEPRAZOLE 40 MG PO CPDR: 40.0000 mg | DELAYED_RELEASE_CAPSULE | Freq: Every day | ORAL | 3 refills | Status: DC

## 2023-01-05 NOTE — Telephone Encounter (Signed)
Sent omeprazole script to pharmacy.

## 2023-01-05 NOTE — Addendum Note (Signed)
Addended by: Richardson Chiquito on: 01/05/2023 12:03 PM   Modules accepted: Orders

## 2023-01-21 ENCOUNTER — Other Ambulatory Visit: Payer: Self-pay | Admitting: Family

## 2023-01-21 DIAGNOSIS — G4700 Insomnia, unspecified: Secondary | ICD-10-CM

## 2023-02-20 ENCOUNTER — Other Ambulatory Visit: Payer: Self-pay | Admitting: Family

## 2023-02-21 ENCOUNTER — Encounter: Payer: Self-pay | Admitting: Family

## 2023-02-22 MED ORDER — WARFARIN SODIUM 5 MG PO TABS: 5.0000 mg | ORAL_TABLET | Freq: Every day | ORAL | 0 refills | Status: DC

## 2023-02-22 NOTE — Telephone Encounter (Signed)
Sarah,   Please see discussion in MyChart. Are you good with coumadin switch?   Windell Moulding, can you please schedule pt a nurse visit INR check on Friday?

## 2023-02-22 NOTE — Telephone Encounter (Signed)
Patient reports she received message and she was scheduled to come in Friday for INR

## 2023-02-22 NOTE — Addendum Note (Signed)
Addended by: Sandford Craze on: 02/22/2023 02:51 PM   Modules accepted: Orders

## 2023-02-25 ENCOUNTER — Ambulatory Visit: Payer: Medicare Other

## 2023-02-25 DIAGNOSIS — Z7901 Long term (current) use of anticoagulants: Secondary | ICD-10-CM

## 2023-02-25 DIAGNOSIS — Z23 Encounter for immunization: Secondary | ICD-10-CM | POA: Diagnosis not present

## 2023-02-25 DIAGNOSIS — Z86718 Personal history of other venous thrombosis and embolism: Secondary | ICD-10-CM

## 2023-02-25 LAB — POCT INR: INR: 1.2 — AB (ref 2.0–3.0)

## 2023-02-25 NOTE — Progress Notes (Addendum)
Pt here for INR check per Sandford Craze, NP: 02/22/23: I went ahead and sent coumadin for you to begin this evening. We will need to check your level on Friday. "  - pt was previously on Xarelto  Goal INR = 2.0 -3.0   Last INR = none, this is first check  Pt currently takes Coumadin 5 mg daily at 4pm  Pt denies recent antibiotics, no dietary changes and no unusual bruising / bleeding.  INR today = 1.2    Pt advised per Dr Drue Novel (DOD): continue taking Coumadin 5 mg daily. F/u in 5 days. Pt scheduled next Friday d/u MO's schedule.

## 2023-03-04 ENCOUNTER — Ambulatory Visit (INDEPENDENT_AMBULATORY_CARE_PROVIDER_SITE_OTHER): Payer: Medicare Other

## 2023-03-04 DIAGNOSIS — Z86718 Personal history of other venous thrombosis and embolism: Secondary | ICD-10-CM

## 2023-03-04 DIAGNOSIS — Z7901 Long term (current) use of anticoagulants: Secondary | ICD-10-CM | POA: Diagnosis not present

## 2023-03-04 LAB — POCT INR: INR: 1.5 — AB (ref 2.0–3.0)

## 2023-03-04 NOTE — Progress Notes (Signed)
Pt here for INR check per Sandford Craze, NP  Goal INR = 2.0 -3.0   Last INR = 1.2  Pt currently takes Coumadin 5 mg daily.   Pt denies recent antibiotics, no dietary changes and no unusual bruising / bleeding.  INR today = 1.5  Pt advised per Melissa: Take 7.5 mg Mondays and Fridays (1.5 tab) . And 5 mg all other days.  RPT next Friday. Pt aware and voices understanding.

## 2023-03-07 ENCOUNTER — Telehealth: Payer: Self-pay | Admitting: Family

## 2023-03-07 ENCOUNTER — Other Ambulatory Visit: Payer: Self-pay | Admitting: Family

## 2023-03-07 NOTE — Telephone Encounter (Signed)
Please contact pt to schedule a follow up visi.

## 2023-03-08 NOTE — Telephone Encounter (Signed)
Lvm2 sched  

## 2023-03-11 ENCOUNTER — Ambulatory Visit (INDEPENDENT_AMBULATORY_CARE_PROVIDER_SITE_OTHER): Payer: Medicare Other

## 2023-03-11 DIAGNOSIS — Z86718 Personal history of other venous thrombosis and embolism: Secondary | ICD-10-CM | POA: Diagnosis not present

## 2023-03-11 LAB — POCT INR: INR: 1.9 — AB (ref 2.0–3.0)

## 2023-03-11 NOTE — Progress Notes (Signed)
Pt here for INR check per PCP  Goal INR = 2.0 - 3.0  Last INR = 1.5  Pt currently takes Coumadin Take 7.5 mg Mondays and Fridays (1.5 tab) . And 5 mg all other days.  RPT next Friday. Pt aware and voices understanding  Pt denies recent antibiotics, no dietary changes and no unusual bruising / bleeding.  INR today = 1.9  Pt advised per PCP 7.5 Monday, Wednesday and Friday (1.5tab). 5 mg all other days follow up 1 week

## 2023-03-16 ENCOUNTER — Ambulatory Visit: Payer: Medicare Other

## 2023-03-16 ENCOUNTER — Other Ambulatory Visit: Payer: Self-pay | Admitting: Family

## 2023-03-24 ENCOUNTER — Ambulatory Visit (INDEPENDENT_AMBULATORY_CARE_PROVIDER_SITE_OTHER): Payer: Medicare Other

## 2023-03-24 ENCOUNTER — Telehealth: Payer: Self-pay

## 2023-03-24 DIAGNOSIS — Z86718 Personal history of other venous thrombosis and embolism: Secondary | ICD-10-CM

## 2023-03-24 DIAGNOSIS — Z7901 Long term (current) use of anticoagulants: Secondary | ICD-10-CM | POA: Diagnosis not present

## 2023-03-24 LAB — POCT INR: INR: 1.7 — AB (ref 2.0–3.0)

## 2023-03-24 NOTE — Telephone Encounter (Signed)
Pt called back and was scheduled for 12.10.24 @ 10:20

## 2023-03-24 NOTE — Telephone Encounter (Signed)
We can offer her 9:20 or 10:20 on Tuesdays. Those spots are currently blocked but I am asking Marchelle Folks to open them moving forward. Tks!

## 2023-03-24 NOTE — Telephone Encounter (Signed)
Pt was in office for an INR check today 03/24/23:   Pt was advised per DOD: Dr. Laury Axon: to switch her meds and start taking 5 mg Monday, Wednesday and Friday. And 7.5 mg (1.5 tabs) all other days   Since she was switched to coumadin on 02/21/23 d/t costs and she has not been seen since July 2024- Dr Laury Axon thinks next weeks INR check should be in an OV with PCP.   Pt prefers mornings. All morning apts are "PCP chart times" - wanted to get approval before adding to schedule.   Preferred apt: 03/30/23: 11 am.

## 2023-03-24 NOTE — Progress Notes (Addendum)
Pt here for 1 week INR check per Sandford Craze, NP  Goal INR = 2.0 - 3.0  Last INR = 1.9  (on 03/11/2023)  Pt currently takes Coumadin 5 mg: 7.5 mg (1.5 tabs)  on Monday, Wednesday and Friday. And 5 mg all other days   Pt denies recent antibiotics, no dietary changes and no unusual bruising / bleeding.  INR today = 1.7   Pt advised per DOD: Dr. Laury Axon: to switch her meds and take 5 mg Monday, Wednesday and Friday. And 7.5 mg (1.5 tabs) all other days. Pt aware and voices understanding.   Pt was switched to coumadin on 02/21/23 d/t costs. "The Xarelto has prevented me from getting a supplement insurance plan for Medicare." Since she has not seen MO since July- Dr Laury Axon thinks next weeks INR check should be in and OV with MO.   Will send TE to Windell Moulding and MO to see about adding the pt to the schedule since these days next week are chart times.

## 2023-03-24 NOTE — Telephone Encounter (Signed)
Called patient a few times to scheduled but no answer. Lvm

## 2023-03-25 NOTE — Telephone Encounter (Signed)
Noted, thanks!

## 2023-03-28 ENCOUNTER — Other Ambulatory Visit: Payer: Self-pay | Admitting: Family

## 2023-03-29 ENCOUNTER — Ambulatory Visit (HOSPITAL_BASED_OUTPATIENT_CLINIC_OR_DEPARTMENT_OTHER)
Admission: RE | Admit: 2023-03-29 | Discharge: 2023-03-29 | Disposition: A | Discharge status: Home / Self Care | Payer: Medicare Other | Source: Ambulatory Visit | Attending: Family | Admitting: Family

## 2023-03-29 ENCOUNTER — Ambulatory Visit (INDEPENDENT_AMBULATORY_CARE_PROVIDER_SITE_OTHER): Payer: Medicare Other | Admitting: Family

## 2023-03-29 VITALS — BP 123/72 | HR 75 | Temp 98.5°F | Resp 16 | Ht 62.0 in | Wt 167.0 lb

## 2023-03-29 DIAGNOSIS — R0602 Shortness of breath: Secondary | ICD-10-CM | POA: Diagnosis not present

## 2023-03-29 DIAGNOSIS — I700 Atherosclerosis of aorta: Secondary | ICD-10-CM | POA: Diagnosis not present

## 2023-03-29 DIAGNOSIS — R634 Abnormal weight loss: Secondary | ICD-10-CM | POA: Diagnosis not present

## 2023-03-29 DIAGNOSIS — M8580 Other specified disorders of bone density and structure, unspecified site: Secondary | ICD-10-CM

## 2023-03-29 DIAGNOSIS — Z780 Asymptomatic menopausal state: Secondary | ICD-10-CM | POA: Insufficient documentation

## 2023-03-29 DIAGNOSIS — R059 Cough, unspecified: Secondary | ICD-10-CM | POA: Diagnosis not present

## 2023-03-29 DIAGNOSIS — R0989 Other specified symptoms and signs involving the circulatory and respiratory systems: Secondary | ICD-10-CM | POA: Diagnosis not present

## 2023-03-29 DIAGNOSIS — J40 Bronchitis, not specified as acute or chronic: Secondary | ICD-10-CM | POA: Insufficient documentation

## 2023-03-29 DIAGNOSIS — M810 Age-related osteoporosis without current pathological fracture: Secondary | ICD-10-CM | POA: Diagnosis not present

## 2023-03-29 DIAGNOSIS — F32A Depression, unspecified: Secondary | ICD-10-CM

## 2023-03-29 DIAGNOSIS — H8111 Benign paroxysmal vertigo, right ear: Secondary | ICD-10-CM | POA: Diagnosis not present

## 2023-03-29 DIAGNOSIS — Z86718 Personal history of other venous thrombosis and embolism: Secondary | ICD-10-CM | POA: Diagnosis not present

## 2023-03-29 LAB — POCT INR: INR: 1.9 — AB (ref 2.0–3.0)

## 2023-03-29 MED ORDER — CALTRATE 600+D PLUS MINERALS 600-800 MG-UNIT PO TABS: 1.0000 | ORAL_TABLET | Freq: Two times a day (BID) | ORAL | Status: AC

## 2023-03-29 MED ORDER — AMOXICILLIN-POT CLAVULANATE 875-125 MG PO TABS: 1.0000 | ORAL_TABLET | Freq: Two times a day (BID) | ORAL | 0 refills | Status: DC

## 2023-03-29 MED ORDER — WARFARIN SODIUM 5 MG PO TABS: ORAL_TABLET | ORAL | 2 refills | Status: DC

## 2023-03-29 MED ORDER — BENZONATATE 100 MG PO CAPS: 100.0000 mg | ORAL_CAPSULE | Freq: Three times a day (TID) | ORAL | 0 refills | Status: DC | PRN

## 2023-03-29 NOTE — Assessment & Plan Note (Addendum)
Wt Readings from Last 3 Encounters:  10/28/22 161 lb 6.4 oz (73.2 kg)  10/27/22 161 lb (73 kg)  10/05/22 164 lb (74.4 kg)  Weight has stabilized.  Monitor.

## 2023-03-29 NOTE — Assessment & Plan Note (Addendum)
Reports that this is ongoing.  Again declines physical therapay.  She has hearing aids- does not use them.  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 Encouraged her to use her hearing aids.

## 2023-03-29 NOTE — Assessment & Plan Note (Signed)
  INR subtherapeutic at 1.9, but trending towards therapeutic range on current regimen of Coumadin 7.5mg  on weekdays and 5mg  on weekends. Discussed potential switch back to Xarelto in the new year depending on insurance coverage. -Continue current Coumadin regimen. -Check INR in 1 week.

## 2023-03-29 NOTE — Assessment & Plan Note (Signed)
Not currently on calcium. Add caltrate 600mg  bid and update dexa.

## 2023-03-29 NOTE — Assessment & Plan Note (Signed)
  Presented with persistent cough and chest congestion. No fever reported. Auscultation revealed coarse rhonchi -Start Augmentin. -Order chest X-ray to rule out pneumonia. -Continue Mucinex as needed. -Prescribe Tessalon for cough relief.

## 2023-03-29 NOTE — Patient Instructions (Signed)
VISIT SUMMARY:  During today's visit, we discussed your persistent cough and chest congestion, ongoing vertigo, osteopenia, and mental health. We also reviewed your current anticoagulation therapy and made plans for follow-up care.  YOUR PLAN:  -HISTORY OF BLOOD CLOT: Your INR is currently 1.9, which is just below the target range. Continue your current Coumadin regimen of 7.5mg  on weekdays and 5mg  on weekends. We will recheck your INR in one week to ensure it is within the therapeutic range.  -ACUTE BRONCHITIS: Acute bronchitis is an inflammation of the bronchial tubes in the lungs, usually caused by an infection. You have been prescribed Augmentin to treat the infection and Tessalon to help relieve your cough. Please continue taking Mucinex as needed and get a chest X-ray to rule out pneumonia.  -OSTEOPENIA: Osteopenia is a condition where bone mineral density is lower than normal, which can be a precursor to osteoporosis. We will order a repeat bone density scan to monitor your condition. Please start taking an over-the-counter calcium supplement, such as Caltrate 600mg  twice daily.  -VERTIGO: Vertigo is a sensation of feeling off balance, often caused by inner ear problems. You are currently managing this with over-the-counter medication. If this does not help, we may consider physical therapy in the future.  -DEPRESSION: Depression is a mood disorder that causes persistent feelings of sadness and loss of interest. You are stable on your current medications, Citalopram and Wellbutrin, and should continue with this regimen.  INSTRUCTIONS:  Please follow up in 3 months for a routine check-up. Additionally, have your INR checked in one week and get a chest X-ray as soon as possible.

## 2023-03-29 NOTE — Progress Notes (Signed)
Subjective:     Patient ID: Judy Weiss, female    DOB: 20-Jul-1945, 77 y.o.   MRN: [id number]  Chief Complaint  Patient presents with   Hystory of DVT    Here for follow up INR check    HPI  Discussed the use of AI scribe software for clinical note transcription with the patient, who gave verbal consent to proceed.  History of Present Illness   The patient, with a history of anticoagulation for recurrent DVT, presents with a persistent cough and chest congestion. She reports having been ill with similar symptoms three weeks prior, which resolved after a week and a half. However, the symptoms returned and have persisted. The cough is described as deep and is worse when lying down. She denies having a fever.  In addition to the cough, the patient has ongoing issues with vertigo. She has declined physical therapy for this in the past and is currently trying an over-the-counter medication to manage the symptoms.  The patient also reports having osteopenia, for which she is not currently taking calcium supplements. She has noticed a decrease in height, which she attributes to her scoliosis.  Regarding her mental health, the patient reports feeling down but has not been in a depressive hole for a while. She is currently taking citalopram and Wellbutrin for depression and reports that these medications are working well for her.  The patient is currently on warfarin (Coumadin) for anticoagulation. She was previously on Xarelto, but switched due to cost. The patient's INR is currently 1.9, just below the target of 2.0. She has not yet had a full week on the new dosing schedule.          Health Maintenance Due  Topic Date Due   Hepatitis C Screening  Never done   DTaP/Tdap/Td (1 - Tdap) Never done   Zoster Vaccines- Shingrix (1 of 2) Never done   COVID-19 Vaccine (6 - 2023-24 season) 12/19/2022    Past Medical History:  Diagnosis Date   Allergy    food allergies and some  additives. But she does not know what will cause.   Depression    GERD (gastroesophageal reflux disease)    had in past but now controlled. Has hiatal hernia.   Heart murmur    History of blood clots 1996?   left leg   History of colon polyps    History of hiatal hernia    Hx of blood clots    Hyperlipidemia    Status post dilation of esophageal narrowing     Past Surgical History:  Procedure Laterality Date   APPENDECTOMY  1972   DILATION AND CURETTAGE OF UTERUS     HIATAL HERNIA REPAIR  2016   INSERTION OF MESH N/A 01/09/2016   Procedure: INSERTION OF MESH;  Surgeon: Axel Filler, MD;  Location: [facility];  Service: General;  Laterality: N/A;   KNEE SURGERY Bilateral    hx of torn meniscus (arthroscopic surgery)   LEG SURGERY Left 2001   Had veins stripped    SHOULDER SURGERY Right    reports hx arthroscopic surgery    Family History  Problem Relation Age of Onset   Diabetes Mother    Hypothyroidism Mother    Seizures Mother    Liver disease Neg Hx    Esophageal cancer Neg Hx    Colon cancer Neg Hx     Social History   Socioeconomic History   Marital status: Divorced    Spouse name: Not  on file   Number of children: 3   Years of education: Not on file   Highest education level: Associate degree: occupational, Scientist, product/process development, or vocational program  Occupational History   Occupation: retired  Tobacco Use   Smoking status: Never   Smokeless tobacco: Never  Vaping Use   Vaping status: Never Used  Substance and Sexual Activity   Alcohol use: Yes    Comment: 2 cans "hard cider"/day   Drug use: No   Sexual activity: Not on file  Other Topics Concern   Not on file  Social History Narrative   Lives alone- can walk to her daughter's home   Daughter in [place]   Son in [place]   Son [place]   4 grandchildren (twin girls age 58)   Retired- Presenter, broadcasting at a Associate Professor x 25 years.   Divorced   4 cats and a dog   Enjoys antiquing.  Buys and  sells.  Enjoys shopping   Social Determinants of Health   Financial Resource Strain: Low Risk  (08/08/2022)   Overall Financial Resource Strain (CARDIA)    Difficulty of Paying Living Expenses: Not very hard  Food Insecurity: No Food Insecurity (08/08/2022)   Hunger Vital Sign    Worried About Running Out of Food in the Last Year: Never true    Ran Out of Food in the Last Year: Never true  Transportation Needs: No Transportation Needs (08/08/2022)   PRAPARE - Administrator, Civil Service (Medical): No    Lack of Transportation (Non-Medical): No  Physical Activity: Insufficiently Active (08/08/2022)   Exercise Vital Sign    Days of Exercise per Week: 1 day    Minutes of Exercise per Session: 20 min  Stress: No Stress Concern Present (08/08/2022)   [employer] of Occupational Health - Occupational Stress Questionnaire    Feeling of Stress : Only a little  Social Connections: Socially Isolated (08/08/2022)   Social Connection and Isolation Panel [NHANES]    Frequency of Communication with Friends and Family: Once a week    Frequency of Social Gatherings with Friends and Family: Once a week    Attends Religious Services: Never    Database administrator or Organizations: No    Attends Engineer, structural: Not on file    Marital Status: Divorced  Intimate Partner Violence: Not At Risk (06/18/2022)   Humiliation, Afraid, Rape, and Kick questionnaire    Fear of Current or Ex-Partner: No    Emotionally Abused: No    Physically Abused: No    Sexually Abused: No    Outpatient Medications Prior to Visit  Medication Sig Dispense Refill   aspirin EC 81 MG tablet Take 1 tablet (81 mg total) by mouth daily. (Patient taking differently: Take 162 mg by mouth daily. Take 2 tablets by mouth daily.)     buPROPion (WELLBUTRIN XL) 300 MG 24 hr tablet Take 1 tablet (300 mg total) by mouth every morning. 90 tablet 1   citalopram (CELEXA) 40 MG tablet TAKE 1 TABLET(40 MG) BY MOUTH  DAILY 30 tablet 0   COVID-19 mRNA bivalent vaccine, Pfizer, (PFIZER COVID-19 VAC BIVALENT) injection Inject into the muscle. 0.3 mL 0   cyanocobalamin 1000 MCG tablet Take 1,000 mcg by mouth daily.     pantoprazole (PROTONIX) 40 MG tablet Take 1 tablet (40 mg total) by mouth daily. 30 tablet 2   vitamin B-12 (CYANOCOBALAMIN) 500 MCG tablet Take 500 mcg by mouth daily.  VITAMIN D PO Take 2,000 Units by mouth daily.     zolpidem (AMBIEN) 5 MG tablet TAKE 1 TABLET BY MOUTH EVERY NIGHT AT BEDTIME AS NEEDED FOR SLEEP 30 tablet 0   omeprazole (PRILOSEC) 40 MG capsule Take 1 capsule (40 mg total) by mouth daily. 30 capsule 3   warfarin (COUMADIN) 5 MG tablet TAKE AS DIRECTED BY COUMADIN CLINIC 30 tablet 0   No facility-administered medications prior to visit.    No Known Allergies  ROS     Objective:    Physical Exam Constitutional:      General: She is not in acute distress.    Appearance: Normal appearance. She is well-developed.  HENT:     Head: Normocephalic and atraumatic.     Right Ear: Tympanic membrane, ear canal and external ear normal.     Left Ear: Tympanic membrane, ear canal and external ear normal.  Eyes:     General: No scleral icterus. Neck:     Thyroid: No thyromegaly.  Cardiovascular:     Rate and Rhythm: Normal rate and regular rhythm.     Heart sounds: Murmur heard.     Systolic murmur is present with a grade of 2/6.  Pulmonary:     Effort: Pulmonary effort is normal. No respiratory distress.     Breath sounds: Rhonchi present. No wheezing.     Comments: Coarse cough, bilateral rhonchi Musculoskeletal:     Cervical back: Neck supple.  Skin:    General: Skin is warm and dry.  Neurological:     Mental Status: She is alert and oriented to person, place, and time.  Psychiatric:        Mood and Affect: Mood normal.        Behavior: Behavior normal.        Thought Content: Thought content normal.        Judgment: Judgment normal.      BP 123/72 (BP  Location: Right Arm, Patient Position: Sitting, Cuff Size: Normal)   Pulse 75   Temp 98.5 F (36.9 C) (Oral)   Resp 16   Ht 5\' 2"  (1.575 m)   Wt 167 lb (75.8 kg)   SpO2 97%   BMI 30.54 kg/m  Wt Readings from Last 3 Encounters:  03/29/23 167 lb (75.8 kg)  10/28/22 161 lb 6.4 oz (73.2 kg)  10/27/22 161 lb (73 kg)       Assessment & Plan:   Problem List Items Addressed This Visit       Unprioritized   Weight loss    Wt Readings from Last 3 Encounters:  10/28/22 161 lb 6.4 oz (73.2 kg)  10/27/22 161 lb (73 kg)  10/05/22 164 lb (74.4 kg)  Weight has stabilized.  Monitor.       Osteopenia    Not currently on calcium. Add caltrate 600mg  bid and update dexa.      History of DVT (deep vein thrombosis) - Primary     INR subtherapeutic at 1.9, but trending towards therapeutic range on current regimen of Coumadin 7.5mg  on weekdays and 5mg  on weekends. Discussed potential switch back to Xarelto in the new year depending on insurance coverage. -Continue current Coumadin regimen. -Check INR in 1 week.       Relevant Orders   POCT INR (Completed)   Depression    Improved on citalopram.  Continue along with wellbutrin.        Bronchitis     Presented with persistent cough and chest congestion.  No fever reported. Auscultation revealed coarse rhonchi -Start Augmentin. -Order chest X-ray to rule out pneumonia. -Continue Mucinex as needed. -Prescribe Tessalon for cough relief.       Relevant Orders   DG Chest 2 View   Benign paroxysmal positional vertigo of right ear    Reports that this is ongoing.  Again declines physical therapay.  She has hearing aids- does not use them.  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 Encouraged her to use her hearing aids.      Other Visit Diagnoses     Postmenopausal estrogen deficiency       Relevant Orders   DG Bone Density       I have discontinued Babita Allemand's omeprazole. I am also having her start on Caltrate 600+D Plus Minerals,  amoxicillin-clavulanate, and benzonatate. Additionally, I am having her maintain her aspirin EC, cyanocobalamin, VITAMIN D PO, Pfizer COVID-19 Vac Bivalent, cyanocobalamin, buPROPion, pantoprazole, zolpidem, citalopram, and warfarin.  Meds ordered this encounter  Medications   warfarin (COUMADIN) 5 MG tablet    Sig: TAKE AS DIRECTED BY COUMADIN CLINIC    Dispense:  90 tablet    Refill:  2    Order Specific Question:   Supervising Provider    Answer:   Danise Edge A [[id number]]   Calcium Carbonate-Vit D-Min (CALTRATE 600+D PLUS MINERALS) 600-800 MG-UNIT TABS    Sig: Take 1 tablet by mouth in the morning and at bedtime.    Order Specific Question:   Supervising Provider    Answer:   Danise Edge A [[id number]]   amoxicillin-clavulanate (AUGMENTIN) 875-125 MG tablet    Sig: Take 1 tablet by mouth 2 (two) times daily.    Dispense:  14 tablet    Refill:  0    Order Specific Question:   Supervising Provider    Answer:   Danise Edge A [[id number]]   benzonatate (TESSALON) 100 MG capsule    Sig: Take 1 capsule (100 mg total) by mouth 3 (three) times daily as needed.    Dispense:  20 capsule    Refill:  0    Order Specific Question:   Supervising Provider    Answer:   Danise Edge A [[id number]]

## 2023-03-29 NOTE — Assessment & Plan Note (Signed)
Improved on citalopram.  Continue along with wellbutrin.

## 2023-03-31 ENCOUNTER — Telehealth: Payer: Self-pay | Admitting: Family

## 2023-03-31 ENCOUNTER — Encounter: Payer: Self-pay | Admitting: Family

## 2023-03-31 DIAGNOSIS — M810 Age-related osteoporosis without current pathological fracture: Secondary | ICD-10-CM | POA: Insufficient documentation

## 2023-03-31 MED ORDER — ALENDRONATE SODIUM 70 MG PO TABS: 70.0000 mg | ORAL_TABLET | ORAL | 4 refills | Status: DC

## 2023-03-31 NOTE — Telephone Encounter (Signed)
Patient notified of results and new prescription and specific instructions for taking fosamax and take calcium twice a day.

## 2023-03-31 NOTE — Telephone Encounter (Signed)
Bone density shows osteoprososis.  I would like her to add fosamax 70mg  once weekly.  Take in the AM and sit upright for 90 minutes after taking. Continue calcium supplement bid.

## 2023-03-31 NOTE — Telephone Encounter (Signed)
Patient also notified her xray was negative for pneumonia

## 2023-03-31 NOTE — Telephone Encounter (Signed)
Called a few times today but no answer. Lvm for patient to call back

## 2023-04-07 ENCOUNTER — Other Ambulatory Visit (HOSPITAL_BASED_OUTPATIENT_CLINIC_OR_DEPARTMENT_OTHER): Payer: Self-pay

## 2023-04-07 MED ORDER — CLOBETASOL PROPIONATE 0.05 % EX OINT
1.0000 | TOPICAL_OINTMENT | Freq: Two times a day (BID) | CUTANEOUS | 1 refills | Status: AC
  Filled 2023-04-07: qty 60, 30d supply, fill #0

## 2023-04-11 ENCOUNTER — Other Ambulatory Visit (HOSPITAL_BASED_OUTPATIENT_CLINIC_OR_DEPARTMENT_OTHER): Payer: Self-pay

## 2023-04-14 ENCOUNTER — Other Ambulatory Visit (HOSPITAL_BASED_OUTPATIENT_CLINIC_OR_DEPARTMENT_OTHER): Payer: Self-pay

## 2023-04-16 ENCOUNTER — Other Ambulatory Visit: Payer: Self-pay | Admitting: Family

## 2023-05-03 ENCOUNTER — Encounter: Payer: Self-pay | Admitting: Family

## 2023-05-03 DIAGNOSIS — R269 Unspecified abnormalities of gait and mobility: Secondary | ICD-10-CM

## 2023-05-20 ENCOUNTER — Telehealth: Payer: Self-pay | Admitting: Family

## 2023-05-20 NOTE — Telephone Encounter (Signed)
Copied from CRM [id number]. Topic: Medicare AWV >> May 20, 2023  2:07 PM Payton Doughty wrote: Reason for CRM: Called LVM 05/20/2023 to schedule AWV. Please schedule Virtual or Telehealth visits ONLY.   Verlee Rossetti; Care Guide Ambulatory Clinical Support [place] [facility] Direct Dial: [phone]

## 2023-05-23 ENCOUNTER — Other Ambulatory Visit: Payer: Self-pay | Admitting: Gastroenterology

## 2023-05-26 ENCOUNTER — Other Ambulatory Visit: Payer: Self-pay | Admitting: Family

## 2023-06-08 ENCOUNTER — Other Ambulatory Visit: Payer: Self-pay | Admitting: Gastroenterology

## 2023-06-16 ENCOUNTER — Other Ambulatory Visit: Payer: Self-pay | Admitting: Gastroenterology

## 2023-07-01 ENCOUNTER — Encounter: Payer: Self-pay | Admitting: Family

## 2023-07-04 NOTE — Telephone Encounter (Signed)
 Patient scheduled to come in for INR tomorrow. She reports she is taking Coumadin 5 mg daily and 7.5 on weekends. Last office visit note reads 7.5 Monday to Friday and 5 mg on weekends.

## 2023-07-05 ENCOUNTER — Ambulatory Visit

## 2023-07-05 ENCOUNTER — Ambulatory Visit (INDEPENDENT_AMBULATORY_CARE_PROVIDER_SITE_OTHER): Admitting: *Deleted

## 2023-07-05 DIAGNOSIS — Z86718 Personal history of other venous thrombosis and embolism: Secondary | ICD-10-CM | POA: Diagnosis not present

## 2023-07-05 LAB — POCT INR: INR: 1.7 — AB (ref 2.0–3.0)

## 2023-07-05 NOTE — Progress Notes (Signed)
 Pt here for INR check per  Goal INR =2.0-3.0  Last INR = 1.9  Pt currently takes Coumadin 7.5mg  Monday - Friday and 5mg  on Saturday and Sundays.   Pt thinks she may have been taking wrong.  Her last check was 03/29/23.  Pt denies recent antibiotics, no dietary changes and no unusual bruising / bleeding.  Had a fall on Sunday night.  INR today = 1.7  Pt advised over phone per Melissa to Take 1 and 1/2 tablet (7.5mg ) daily except for Fridays take 1 tablet (5mg ) and to recheck in 10 days.  Appointment scheduled for 07/15/23.

## 2023-07-06 ENCOUNTER — Telehealth: Payer: Self-pay | Admitting: *Deleted

## 2023-07-06 DIAGNOSIS — R2689 Other abnormalities of gait and mobility: Secondary | ICD-10-CM | POA: Diagnosis not present

## 2023-07-06 DIAGNOSIS — R269 Unspecified abnormalities of gait and mobility: Secondary | ICD-10-CM | POA: Diagnosis not present

## 2023-07-06 NOTE — Telephone Encounter (Signed)
 Patient came in yesterday for nv and stated that we needed to send her referral for PT to another place.  She did not know the name or address of place.  She stated that her daughter Sumner Boast would know.  Pt had given a phone number [phone] but I tried calling but no answer and just went to busy signal.  Pt stated that she think it may have been a fax number.    Left message on machine for pt daughter Sumner Boast to call back to see if she could give information.

## 2023-07-06 NOTE — Telephone Encounter (Signed)
 Spoke with pt daughter Sumner Boast and she stated that she is the Public relations account executive of the office we tried to send her to and would like [place] to resend referral cause they will now take it.  She has an appt at 4pm today.  Per referrals they will fax over now.

## 2023-07-12 ENCOUNTER — Ambulatory Visit

## 2023-07-13 ENCOUNTER — Other Ambulatory Visit: Payer: Self-pay | Admitting: Family

## 2023-07-13 DIAGNOSIS — G4700 Insomnia, unspecified: Secondary | ICD-10-CM

## 2023-07-15 ENCOUNTER — Ambulatory Visit: Admitting: Emergency Medicine

## 2023-07-15 ENCOUNTER — Ambulatory Visit

## 2023-07-15 DIAGNOSIS — I824Z2 Acute embolism and thrombosis of unspecified deep veins of left distal lower extremity: Secondary | ICD-10-CM | POA: Diagnosis not present

## 2023-07-15 LAB — POCT INR: INR: 3.3 — AB (ref 2.0–3.0)

## 2023-07-15 NOTE — Progress Notes (Signed)
 Pt here for INR check per Sandford Craze  Goal INR = 2.0-3.0  Last INR = 1.7  Pt currently takes Coumadin 1 and 1/2 tablet (7.5mg ) daily except for Fridays take 1 tablet (5mg )   Pt denies recent antibiotics, no dietary changes and no unusual bruising / bleeding.  INR today = 3.3  Pt advised per Melissa to take 1 Tablet (5 mg) on Tuesday and Friday and 1 1/2 Tablet (7.5 mg) on Monday, Wednesday, Thursday and Saturday.

## 2023-07-18 DIAGNOSIS — R2689 Other abnormalities of gait and mobility: Secondary | ICD-10-CM | POA: Diagnosis not present

## 2023-07-18 DIAGNOSIS — R269 Unspecified abnormalities of gait and mobility: Secondary | ICD-10-CM | POA: Diagnosis not present

## 2023-07-29 ENCOUNTER — Ambulatory Visit

## 2023-07-29 ENCOUNTER — Other Ambulatory Visit: Payer: Self-pay | Admitting: Family

## 2023-08-09 ENCOUNTER — Encounter: Payer: Self-pay | Admitting: Family

## 2023-08-10 ENCOUNTER — Other Ambulatory Visit: Payer: Self-pay

## 2023-08-11 ENCOUNTER — Other Ambulatory Visit: Payer: Self-pay

## 2023-08-11 MED ORDER — BUPROPION HCL ER (XL) 300 MG PO TB24: 300.0000 mg | ORAL_TABLET | Freq: Every day | ORAL | 1 refills | Status: DC

## 2023-08-12 ENCOUNTER — Telehealth: Payer: Self-pay | Admitting: Family

## 2023-08-12 NOTE — Telephone Encounter (Signed)
Please schedule nurse visit for INR check.

## 2023-08-12 NOTE — Telephone Encounter (Signed)
 Copied from CRM [id number]. Topic: General - Other >> Aug 11, 2023  3:41 PM [place] A wrote: Reason for CRM: Patient would like to schedule appointment to have her blood levels checked-please contact  No orders on file.

## 2023-08-15 NOTE — Telephone Encounter (Signed)
 Pt scheduled for 08/24/23.  Tried scheduling sooner but she wanted later time.

## 2023-08-24 ENCOUNTER — Ambulatory Visit: Admitting: Emergency Medicine

## 2023-08-24 DIAGNOSIS — Z86718 Personal history of other venous thrombosis and embolism: Secondary | ICD-10-CM | POA: Diagnosis not present

## 2023-08-24 LAB — POCT INR: INR: 1.9 — AB (ref 2.0–3.0)

## 2023-08-24 NOTE — Progress Notes (Signed)
 Pt here for INR check per Dorrene Gaucher   Goal INR = 2.0-3.0   Last INR = 3.3 (Patient was a No Show on 07/29/2023)   Pt currently takes Coumadin  1 and 1/2 tablet (7.5mg ) daily except for Fridays take 1 tablet (5mg )    Pt denies recent antibiotics, no dietary changes and no unusual bruising / bleeding. Pt didn't add Tuesday from last visit in March.   INR today = 1.9   Pt advised per Melissa to take 7.5 mg Tablet everyday and repeat Nurse visit in 2 weeks.

## 2023-09-06 ENCOUNTER — Ambulatory Visit (INDEPENDENT_AMBULATORY_CARE_PROVIDER_SITE_OTHER): Admitting: *Deleted

## 2023-09-06 DIAGNOSIS — Z7901 Long term (current) use of anticoagulants: Secondary | ICD-10-CM

## 2023-09-06 LAB — POCT INR: INR: 2.6 (ref 2.0–3.0)

## 2023-09-06 NOTE — Progress Notes (Signed)
 Pt here for INR check per Dorrene Gaucher   Goal INR = 2.0-3.0   Last INR = 1.9   Pt currently takes Coumadin  1 and 1/2 tablet (7.5mg ) daily     Pt denies recent antibiotics, no dietary changes and no unusual bruising / bleeding. Pt didn't add Tuesday from last visit in March.   INR today = 2.6   Pt advised per Melissa continue same dose Coumadin  (warfarin) 5mg  1 and 1/2 tablet daily and recheck in 1 month.  Pt was past due for follow up with Melissa so appointment scheduled with her 10/11/23 and they will recheck at visit.

## 2023-09-07 ENCOUNTER — Telehealth: Payer: Self-pay | Admitting: Family

## 2023-09-07 NOTE — Telephone Encounter (Signed)
 Copied from CRM [id number]. Topic: Medicare AWV >> Sep 07, 2023 11:39 AM Juliana Ocean wrote: Reason for CRM: LVM 09/07/2023 to schedule AWV. Please schedule Virtual or Telehealth visits ONLY  Rosalee Collins; Care Guide Ambulatory Clinical Support [place] [facility] Direct Dial: [phone]

## 2023-09-19 ENCOUNTER — Ambulatory Visit (HOSPITAL_BASED_OUTPATIENT_CLINIC_OR_DEPARTMENT_OTHER)
Admission: RE | Admit: 2023-09-19 | Discharge: 2023-09-19 | Disposition: A | Discharge status: Home / Self Care | Source: Ambulatory Visit | Attending: Medical | Admitting: Medical

## 2023-09-19 ENCOUNTER — Ambulatory Visit: Payer: Self-pay | Admitting: Medical

## 2023-09-19 ENCOUNTER — Ambulatory Visit (INDEPENDENT_AMBULATORY_CARE_PROVIDER_SITE_OTHER): Admitting: Medical

## 2023-09-19 VITALS — BP 129/78 | HR 72 | Temp 97.9°F | Resp 12 | Ht 62.0 in | Wt 162.8 lb

## 2023-09-19 DIAGNOSIS — R0781 Pleurodynia: Secondary | ICD-10-CM

## 2023-09-19 DIAGNOSIS — M25561 Pain in right knee: Secondary | ICD-10-CM | POA: Diagnosis not present

## 2023-09-19 DIAGNOSIS — M25562 Pain in left knee: Secondary | ICD-10-CM | POA: Diagnosis not present

## 2023-09-19 DIAGNOSIS — M419 Scoliosis, unspecified: Secondary | ICD-10-CM | POA: Diagnosis not present

## 2023-09-19 DIAGNOSIS — M25461 Effusion, right knee: Secondary | ICD-10-CM | POA: Diagnosis not present

## 2023-09-19 DIAGNOSIS — M25462 Effusion, left knee: Secondary | ICD-10-CM | POA: Diagnosis not present

## 2023-09-19 DIAGNOSIS — J9811 Atelectasis: Secondary | ICD-10-CM | POA: Diagnosis not present

## 2023-09-19 DIAGNOSIS — R0789 Other chest pain: Secondary | ICD-10-CM

## 2023-09-19 MED ORDER — SILVER SULFADIAZINE 1 % EX CREA: 1.0000 | TOPICAL_CREAM | Freq: Two times a day (BID) | CUTANEOUS | 0 refills | Status: DC
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
## 2023-09-21 DIAGNOSIS — R2689 Other abnormalities of gait and mobility: Secondary | ICD-10-CM | POA: Diagnosis not present

## 2023-09-26 DIAGNOSIS — R2689 Other abnormalities of gait and mobility: Secondary | ICD-10-CM | POA: Diagnosis not present

## 2023-09-28 DIAGNOSIS — R2689 Other abnormalities of gait and mobility: Secondary | ICD-10-CM | POA: Diagnosis not present

## 2023-10-03 ENCOUNTER — Other Ambulatory Visit: Payer: Self-pay | Admitting: Family

## 2023-10-04 DIAGNOSIS — R2689 Other abnormalities of gait and mobility: Secondary | ICD-10-CM | POA: Diagnosis not present

## 2023-10-06 DIAGNOSIS — R2689 Other abnormalities of gait and mobility: Secondary | ICD-10-CM | POA: Diagnosis not present

## 2023-10-10 DIAGNOSIS — R2689 Other abnormalities of gait and mobility: Secondary | ICD-10-CM | POA: Diagnosis not present

## 2023-10-11 ENCOUNTER — Ambulatory Visit (INDEPENDENT_AMBULATORY_CARE_PROVIDER_SITE_OTHER): Admitting: Family

## 2023-10-11 ENCOUNTER — Telehealth: Payer: Self-pay | Admitting: Family

## 2023-10-11 VITALS — BP 103/58 | HR 80 | Temp 98.4°F | Resp 16 | Ht 62.0 in | Wt 159.0 lb

## 2023-10-11 DIAGNOSIS — G4700 Insomnia, unspecified: Secondary | ICD-10-CM | POA: Diagnosis not present

## 2023-10-11 DIAGNOSIS — M25561 Pain in right knee: Secondary | ICD-10-CM

## 2023-10-11 DIAGNOSIS — Z86718 Personal history of other venous thrombosis and embolism: Secondary | ICD-10-CM | POA: Diagnosis not present

## 2023-10-11 DIAGNOSIS — F32A Depression, unspecified: Secondary | ICD-10-CM | POA: Diagnosis not present

## 2023-10-11 DIAGNOSIS — E785 Hyperlipidemia, unspecified: Secondary | ICD-10-CM

## 2023-10-11 DIAGNOSIS — M810 Age-related osteoporosis without current pathological fracture: Secondary | ICD-10-CM

## 2023-10-11 LAB — POCT INR
INR: 4.9 — AB (ref 2.0–3.0)
POC INR: 4.9

## 2023-10-11 NOTE — Patient Instructions (Addendum)
 Hold coumadin  today. Then restart on 6/26 one tablet (5mg ) once daily. VISIT SUMMARY:  Today, we addressed your elevated INR levels, osteoporosis medication adherence, depressive symptoms, and sleep disturbances. We discussed potential changes to your medications and strategies to improve your overall health management.  YOUR PLAN:  ELEVATED INR: Your INR level is elevated at 4.9, which increases your risk of bleeding. -We will adjust your Coumadin  dosing. -Please check if your insurance covers Xarelto , as it may offer more stable dosing without frequent monitoring. -If the cost of Xarelto  remains high, we will explore patient assistance programs.  OSTEOPOROSIS: You are not taking Fosamax  due to concerns about side effects. -We discussed Prolia as an alternative, which is administered twice a year and may improve adherence. -We will review your last bone density scan. -A note will be sent to your insurance for approval of Prolia.  DEPRESSION: You have persistent depressive symptoms despite being on Wellbutrin  for over twenty years. -Continue your current medication regimen. -If your symptoms worsen, we may consider a referral to a psychiatrist.  INSOMNIA: You are having trouble picking up your Ambien  prescription due to an expired driver's license and are using NyQuil as an alternative. -We will renew your Ambien  contract. -Please address the issue with your driver's license to facilitate prescription pickup.

## 2023-10-11 NOTE — Assessment & Plan Note (Signed)
 Fair control on wellbutrin  and citalopram .  Declines referral to psychiatry.

## 2023-10-11 NOTE — Assessment & Plan Note (Addendum)
 Has not been taking fosamax  due to side effects. She is interested in Prolia. Will seek insurance approval.

## 2023-10-11 NOTE — Telephone Encounter (Signed)
 Please initiate Prolia approval.

## 2023-10-11 NOTE — Assessment & Plan Note (Signed)
 Has been out of ambien , requesting refill.  Updated contract today.
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
## 2023-10-12 ENCOUNTER — Ambulatory Visit: Payer: Self-pay | Admitting: Family

## 2023-10-12 ENCOUNTER — Other Ambulatory Visit (HOSPITAL_COMMUNITY): Payer: Self-pay

## 2023-10-12 ENCOUNTER — Telehealth: Payer: Self-pay

## 2023-10-12 DIAGNOSIS — E785 Hyperlipidemia, unspecified: Secondary | ICD-10-CM

## 2023-10-12 LAB — COMPREHENSIVE METABOLIC PANEL WITH GFR
ALT: 9 U/L (ref 0–35)
AST: 15 U/L (ref 0–37)
Albumin: 4.3 g/dL (ref 3.5–5.2)
Alkaline Phosphatase: 55 U/L (ref 39–117)
BUN: 20 mg/dL (ref 6–23)
CO2: 29 meq/L (ref 19–32)
Calcium: 9.3 mg/dL (ref 8.4–10.5)
Chloride: 105 meq/L (ref 96–112)
Creatinine, Ser: 0.87 mg/dL (ref 0.40–1.20)
GFR: 62.92 mL/min (ref >60.00)
Glucose, Bld: 103 mg/dL — ABNORMAL HIGH (ref 70–99)
Potassium: 4.7 meq/L (ref 3.5–5.1)
Sodium: 142 meq/L (ref 135–145)
Total Bilirubin: 0.5 mg/dL (ref 0.2–1.2)
Total Protein: 6.6 g/dL (ref 6.0–8.3)

## 2023-10-12 LAB — LIPID PANEL
Cholesterol: 251 mg/dL — ABNORMAL HIGH (ref <200)
HDL: 61 mg/dL (ref >=50)
LDL Cholesterol (Calc): 170 mg/dL — ABNORMAL HIGH
Non-HDL Cholesterol (Calc): 190 mg/dL — ABNORMAL HIGH (ref <130)
Total CHOL/HDL Ratio: 4.1 (calc) (ref <5.0)
Triglycerides: 87 mg/dL (ref <150)

## 2023-10-12 MED ORDER — ATORVASTATIN CALCIUM 20 MG PO TABS: 20.0000 mg | ORAL_TABLET | Freq: Every day | ORAL | 1 refills | Status: DC

## 2023-10-12 MED ORDER — DENOSUMAB 60 MG/ML ~~LOC~~ SOSY: 60.0000 mg | PREFILLED_SYRINGE | Freq: Once | SUBCUTANEOUS | Status: AC

## 2023-10-12 NOTE — Telephone Encounter (Signed)
 Prolia initiated.  Will take about 1-2 weeks to run insurance.

## 2023-10-12 NOTE — Telephone Encounter (Signed)
 Cholesterol is very high. I would recommend that she add atorvastatin  20mg  once daily. Repeat lipids in 6 weeks.

## 2023-10-12 NOTE — Assessment & Plan Note (Addendum)
 Supratheraputic INR 4.9. Hold coumadin  today. Then restart on 6/26 one tablet (5mg ) once daily. Repeat INR 1 week.  Pt is going to look into cost for xarelto  again and let me know if it is affordable for her.

## 2023-10-12 NOTE — Telephone Encounter (Addendum)
 Prolia VOB initiated via [email or web address]  Next Prolia inj DUE: NEW START   PHARMACY BENEFIT: $546.85

## 2023-10-13 DIAGNOSIS — R2689 Other abnormalities of gait and mobility: Secondary | ICD-10-CM | POA: Diagnosis not present

## 2023-10-13 NOTE — Telephone Encounter (Signed)
 Please advise pt that her insurance will not cover prolia.  If she would like I can work on getting her reclast which is a once a year infusion.  Similar to fosamax  but given IV.

## 2023-10-13 NOTE — Telephone Encounter (Signed)
 Judy Weiss

## 2023-10-13 NOTE — Telephone Encounter (Signed)
 Completed referral for pharmacy benefit

## 2023-10-13 NOTE — Telephone Encounter (Signed)
 Prior Authorization form/request asks a question that requires your assistance. Please see the question below and advise accordingly. The PA will not be submitted until the necessary information is received.        Insurance requires failure or contraindication to BOTH an oral and IV bisphosphate. Patient only has failure to oral bisphosphate.   Patient can get Prolia through pharmacy benefits with no PA required. Patient's copay is $546.85.

## 2023-10-17 ENCOUNTER — Telehealth: Payer: Self-pay | Admitting: Family

## 2023-10-17 DIAGNOSIS — R2689 Other abnormalities of gait and mobility: Secondary | ICD-10-CM | POA: Diagnosis not present

## 2023-10-17 NOTE — Telephone Encounter (Signed)
 Pharmacy did not approve prolia  injection.  They will pay for the Reclast infusion which is a similar medicine to fosamax  but once a year through an IV infusion. Would she be interested in the Reclast?

## 2023-10-18 NOTE — Telephone Encounter (Signed)
 Patient notified of this information. She will like to think about it and she will call us  back

## 2023-10-19 ENCOUNTER — Ambulatory Visit

## 2023-10-23 ENCOUNTER — Other Ambulatory Visit: Payer: Self-pay | Admitting: Family

## 2023-10-23 DIAGNOSIS — Z86718 Personal history of other venous thrombosis and embolism: Secondary | ICD-10-CM

## 2023-10-23 DIAGNOSIS — F32A Depression, unspecified: Secondary | ICD-10-CM

## 2023-10-28 ENCOUNTER — Telehealth: Payer: Self-pay | Admitting: Family

## 2023-10-28 DIAGNOSIS — Z5181 Encounter for therapeutic drug level monitoring: Secondary | ICD-10-CM

## 2023-10-28 NOTE — Addendum Note (Signed)
 Addended by: DARYL SETTER on: 10/28/2023 05:34 PM   Modules accepted: Orders

## 2023-10-28 NOTE — Telephone Encounter (Signed)
 Unfortunately we don't have nurse visits available on some days d/t staffing, okay to check it via lab?

## 2023-10-28 NOTE — Telephone Encounter (Signed)
 Yes, future order placed for lab PT/INR. Please contact pt to schedule lab visit.

## 2023-10-28 NOTE — Telephone Encounter (Signed)
 Copied from CRM [id number]. Topic: Appointments - Scheduling Inquiry for Clinic >> Oct 28, 2023 11:27 AM Berneda FALCON wrote: Reason for CRM: Pt missed her INR check on 7/2 and showing next available is 7/22 (she does not wish to go anywhere but the [place] location for this). She would like to know if she can get in sooner please. Prefers afternoon but will come in the morning as well if needed  Pt callback is [phone]

## 2023-10-31 NOTE — Telephone Encounter (Signed)
 Patient scheduled to have PTINR with lab tomorrow

## 2023-11-01 ENCOUNTER — Other Ambulatory Visit (INDEPENDENT_AMBULATORY_CARE_PROVIDER_SITE_OTHER)

## 2023-11-01 DIAGNOSIS — Z5181 Encounter for therapeutic drug level monitoring: Secondary | ICD-10-CM

## 2023-11-02 LAB — PROTIME-INR
INR: 1.7 ratio — ABNORMAL HIGH (ref 0.8–1.0)
Prothrombin Time: 18.1 s — ABNORMAL HIGH (ref 9.6–13.1)

## 2023-11-03 ENCOUNTER — Ambulatory Visit: Payer: Self-pay | Admitting: Family

## 2023-11-03 NOTE — Telephone Encounter (Signed)
 INR is low.  Please change coumadin  to 5mg  once daily except 7.5 mg (1.5 tabs) twice weekly on Thursdays and Sunday's.  Repeat INR at nurse visit in 2 weeks.

## 2023-11-08 ENCOUNTER — Ambulatory Visit: Payer: Self-pay

## 2023-11-08 ENCOUNTER — Ambulatory Visit (INDEPENDENT_AMBULATORY_CARE_PROVIDER_SITE_OTHER): Admitting: Student

## 2023-11-08 ENCOUNTER — Encounter: Payer: Self-pay | Admitting: Student

## 2023-11-08 VITALS — BP 110/82 | HR 71 | Temp 98.0°F | Resp 16 | Ht 62.0 in | Wt 161.0 lb

## 2023-11-08 DIAGNOSIS — Z8619 Personal history of other infectious and parasitic diseases: Secondary | ICD-10-CM | POA: Insufficient documentation

## 2023-11-08 MED ORDER — VALACYCLOVIR HCL 1 G PO TABS: 1000.0000 mg | ORAL_TABLET | Freq: Three times a day (TID) | ORAL | 0 refills | Status: DC

## 2023-11-08 NOTE — Telephone Encounter (Signed)
 FYI Only or Action Required?: FYI only for provider.  Patient was last seen in primary care on 10/11/2023 by Daryl Setter, NP.  Called Nurse Triage reporting No chief complaint on file..  Symptoms began 2 days ago.  Interventions attempted: Nothing.  Symptoms are: gradually worsening.  Triage Disposition: See Physician Within 24 Hours  Patient/caregiver understands and will follow disposition?: Yes     Copied from CRM [id number]. Topic: Clinical - Red Word Triage >> Nov 08, 2023  9:34 AM Jayma L wrote: Red Word that prompted transfer to Nurse Triage: patient called in and stated she is having swelling in her neck past 2-3 days and might be shingles. Its getting worse and she's asking to be seen     Reason for Disposition  [1] Tender node in the neck AND [2] also has a sore throat AND [3] minimal/no runny nose or cough  Answer Assessment - Initial Assessment Questions 1. LOCATION: Where is the swollen node located? Is the matching node on the other side of the body also swollen?      Left sided neck/jaw 2. SIZE: How big is the node? (e.g., inches or centimeters; or compared to common objects such as pea, bean, marble, golf ball)      Not sure  3. ONSET: When did the swelling start?      2 days ago  4. NECK NODES: Is there a sore throat, runny nose or other symptoms of a cold?      Sore throat 5. GROIN OR ARMPIT NODES: Is there a sore, scratch, cut or painful red area on that arm or leg?      No 6. FEVER: Do you have a fever? If Yes, ask: What is it, how was it measured, and when did it start?      No 7. CAUSE: What do you think is causing the swollen lymph nodes?     Unsure 8. OTHER SYMPTOMS: Do you have any other symptoms? (e.g., node is tender to touch, skin redness over node, weight changes)     Bumps on head  Protocols used: Lymph Nodes - Swollen-A-AH

## 2023-11-08 NOTE — Progress Notes (Addendum)
   Acute Office Visit  Subjective:     Patient ID: Judy Weiss, female    DOB: Jan 24, 1946, 78 y.o.   MRN: [id number]  Chief Complaint  Patient presents with   Acute Visit    Patient presents today for neck pain, swelling and lumps in head since Sunday,11/06/23.    HPI Patient is in today for acute visit.Patient reports onset Sunday of left-sided scalp unilateral tingling/ burning pain progressing to moderate-severe tenderness, worse when lying on that side (pain when head on pillow). Also notes left tender  lymph node. Reports a history of shingles on the scalp and had close exposure to her daughter with shingles last week. Reports headache. No rash reported yet. Denies sinus pain or pressure.   ROS  Patient denies fever, chills, SOB, CP, palpitations, dyspnea, edema,  vision changes, N/V/D, abdominal pain, urinary symptoms, rash, weight changes, and recent illness or hospitalizations.     Objective:    BP 110/82   Pulse 71   Temp 98 F (36.7 C)   Resp 16   Ht 5' 2 (1.575 m)   Wt 161 lb (73 kg)   SpO2 95%   BMI 29.45 kg/m    Physical Exam  HEENT: - Head: Normocephalic, atraumatic. +TWP Left posterior- along cervical nerve.  No lesions present. - Eyes: Conjunctivae clear, no scleral icterus. - Ears: Tympanic membranes clear bilaterally, no bulging or erythema. - Nose: Nasal mucosa mildly erythematous, congested; no purulent discharge. - Throat: Mucosa moist. No tonsillar exudate or swelling. Uvula midline. No oral ulcers. - Neck: Supple. No lymphadenopathy or neck stiffness. No thyromegaly or tenderness. -Respiratory: Lungs clear to auscultation bilaterally. No wheezing, rales, or rhonchi. No respiratory distress. Normal work of breathing. -Cardiovascular: Regular rate and rhythm. +murmur.  -Skin: Warm, dry. No rash. -Neurological: Alert and oriented x3. No focal deficits.   No results found for any visits on 11/08/23.      Assessment & Plan:   Problem List  Items Addressed This Visit     History of shingles - Primary   Relevant Medications   valACYclovir  (VALTREX ) 1000 MG tablet  High suspicion of shingles due to presentation, symptoms, and recently exposure to  shingles. Valacyclovir  1000 mg TID sent to pharmacy.  Medication and common side effects reviewed with the patient; patient voiced understanding and had no further questions at this time. Encourage OTC Tylenol  for pain relief and use of cold compresses on sit. Lesions contagious until crusted over, do not touch area with bare hands and ensure proper hand hygiene to prevent spread. Return to clinic if symptoms persist or do not improve.  Red flag symptoms discussed.    Meds ordered this encounter  Medications   valACYclovir  (VALTREX ) 1000 MG tablet    Sig: Take 1 tablet (1,000 mg total) by mouth 3 (three) times daily.    Dispense:  21 tablet    Refill:  0    Supervising Provider:   DOMENICA BLACKBIRD A [[id number]]    No follow-ups on file.  Fabrizio Filip L Uyen Eichholz, NP

## 2023-11-15 ENCOUNTER — Ambulatory Visit (INDEPENDENT_AMBULATORY_CARE_PROVIDER_SITE_OTHER)

## 2023-11-15 VITALS — Ht 62.0 in | Wt 161.0 lb

## 2023-11-15 DIAGNOSIS — Z0000 Encounter for general adult medical examination without abnormal findings: Secondary | ICD-10-CM

## 2023-11-15 NOTE — Patient Instructions (Addendum)
 Ms. Judy Weiss , Thank you for taking time out of your busy schedule to complete your Annual Wellness Visit with me. I enjoyed our conversation and look forward to speaking with you again next year. I, as well as your care team,  appreciate your ongoing commitment to your health goals. Please review the following plan we discussed and let me know if I can assist you in the future. Your Game plan/ To Do List    Referrals: If you haven't heard from the office you've been referred to, please reach out to them at the phone provided.   Follow up Visits: Next Medicare AWV with our clinical staff: 11/20/24 @ 1:10p   Have you seen your provider in the last 6 months (3 months if uncontrolled diabetes)? 03/29/23 Next Office Visit with your provider:   Clinician Recommendations:  Aim for 30 minutes of exercise or brisk walking, 6-8 glasses of water, and 5 servings of fruits and vegetables each day.       This is a list of the screening recommended for you and due dates:  Health Maintenance  Topic Date Due   DTaP/Tdap/Td vaccine (1 - Tdap) Never done   Zoster (Shingles) Vaccine (1 of 2) Never done   COVID-19 Vaccine (6 - 2024-25 season) 12/19/2022   Flu Shot  11/18/2023   Medicare Annual Wellness Visit  11/14/2024   Pneumococcal Vaccine for age over 73  Completed   DEXA scan (bone density measurement)  Completed   Hepatitis B Vaccine  Aged Out   HPV Vaccine  Aged Out   Meningitis B Vaccine  Aged Out   Colon Cancer Screening  Discontinued    Advanced directives: (Declined) Advance directive discussed with you today. Even though you declined this today, please call our office should you change your mind, and we can give you the proper paperwork for you to fill out. Advance Care Planning is important because it:  [x]  Makes sure you receive the medical care that is consistent with your values, goals, and preferences  [x]  It provides guidance to your family and loved ones and reduces their decisional  burden about whether or not they are making the right decisions based on your wishes.  Follow the link provided in your after visit summary or read over the paperwork we have mailed to you to help you started getting your Advance Directives in place. If you need assistance in completing these, please reach out to us  so that we can help you!  See attachments for Preventive Care and Fall Prevention Tips.

## 2023-11-15 NOTE — Progress Notes (Signed)
 Subjective:   Judy Weiss is a 78 y.o. who presents for a Medicare Wellness preventive visit.  As a reminder, Annual Wellness Visits don't include a physical exam, and some assessments may be limited, especially if this visit is performed virtually. We may recommend an in-person follow-up visit with your provider if needed.  Visit Complete: Virtual I connected with  Judy Weiss on 11/15/23 by a audio enabled telemedicine application and verified that I am speaking with the correct person using two identifiers.  Patient Location: Home  Provider Location: Home Office  I discussed the limitations of evaluation and management by telemedicine. The patient expressed understanding and agreed to proceed.  Vital Signs: Because this visit was a virtual/telehealth visit, some criteria may be missing or patient reported. Any vitals not documented were not able to be obtained and vitals that have been documented are patient reported.    Persons Participating in Visit: Patient.  AWV Questionnaire: No: Patient Medicare AWV questionnaire was not completed prior to this visit.  Cardiac Risk Factors include: advanced age (>14men, >64 women)     Objective:    Today's Vitals   11/15/23 1319  Weight: 161 lb (73 kg)  Height: 5' 2 (1.575 m)   Body mass index is 29.45 kg/m.     11/15/2023    1:26 PM 10/28/2022   11:40 AM 06/18/2022    2:19 PM 05/21/2022    3:40 PM 11/10/2020    2:27 PM 07/27/2019    9:06 PM 10/09/2018    3:09 PM  Advanced Directives  Does Patient Have a Medical Advance Directive? No Yes Yes No No No No  Type of Special educational needs teacher of [place];Living will Healthcare Power of Attorney      Does patient want to make changes to medical advance directive?  No - Patient declined       Copy of Healthcare Power of Attorney in Chart?  No - copy requested No - copy requested      Would patient like information on creating a medical advance directive? No - Patient  declined    No - Patient declined  No - Patient declined      Data saved with a previous flowsheet row definition    Current Medications (verified) Outpatient Encounter Medications as of 11/15/2023  Medication Sig   alendronate  (FOSAMAX ) 70 MG tablet Take 1 tablet (70 mg total) by mouth every 7 (seven) days. Take with a full glass of water on an empty stomach.   aspirin  EC 81 MG tablet Take 1 tablet (81 mg total) by mouth daily. (Patient taking differently: Take 162 mg by mouth daily. Take 2 tablets by mouth daily.)   atorvastatin  (LIPITOR) 20 MG tablet Take 1 tablet (20 mg total) by mouth daily.   benzonatate  (TESSALON ) 100 MG capsule Take 1 capsule (100 mg total) by mouth 3 (three) times daily as needed.   buPROPion  (WELLBUTRIN  XL) 300 MG 24 hr tablet TAKE ONE TABLET BY MOUTH ONCE A DAY   Calcium  Carbonate-Vit D-Min (CALTRATE 600+D PLUS MINERALS) 600-800 MG-UNIT TABS Take 1 tablet by mouth in the morning and at bedtime.   citalopram  (CELEXA ) 40 MG tablet TAKE ONE TABLET BY MOUTH ONCE A DAY   clobetasol  ointment (TEMOVATE ) 0.05 % APPLY A THIN LAYER TO THE AFFECTED AREA(S) TOPICALLY 2 TIMES PER DAY FOR 1 MONTH, THEN AS NEEDED   cyanocobalamin  1000 MCG tablet Take 1,000 mcg by mouth daily.   omeprazole  (PRILOSEC) 40 MG capsule TAKE 1  CAPSULE(40 MG) BY MOUTH DAILY   silver  sulfADIAZINE  (SILVADENE ) 1 % cream Apply 1 Application topically 2 (two) times daily.   valACYclovir  (VALTREX ) 1000 MG tablet Take 1 tablet (1,000 mg total) by mouth 3 (three) times daily.   VITAMIN D  PO Take 2,000 Units by mouth daily.   warfarin (COUMADIN ) 5 MG tablet TAKE AS DIRECTED BY COUMADIN  CLINIC   zolpidem  (AMBIEN ) 5 MG tablet TAKE 1 TABLET BY MOUTH EVERY NIGHT AT BEDTIME AS NEEDED FOR SLEEP   Facility-Administered Encounter Medications as of 11/15/2023  Medication   denosumab  (PROLIA ) injection 60 mg    Allergies (verified) Patient has no known allergies.   History: Past Medical History:  Diagnosis Date    Allergy    food allergies and some additives. But she does not know what will cause.   Depression    GERD (gastroesophageal reflux disease)    had in past but now controlled. Has hiatal hernia.   Heart murmur    History of blood clots 1996?   left leg   History of colon polyps    History of hiatal hernia    Hx of blood clots    Hyperlipidemia    Osteoporosis    Status post dilation of esophageal narrowing    Past Surgical History:  Procedure Laterality Date   APPENDECTOMY  1972   DILATION AND CURETTAGE OF UTERUS     HIATAL HERNIA REPAIR  2016   INSERTION OF MESH N/A 01/09/2016   Procedure: INSERTION OF MESH;  Surgeon: Lynda Leos, MD;  Location: [facility];  Service: General;  Laterality: N/A;   KNEE SURGERY Bilateral    hx of torn meniscus (arthroscopic surgery)   LEG SURGERY Left 2001   Had veins stripped    SHOULDER SURGERY Right    reports hx arthroscopic surgery   Family History  Problem Relation Age of Onset   Diabetes Mother    Hypothyroidism Mother    Seizures Mother    Liver disease Neg Hx    Esophageal cancer Neg Hx    Colon cancer Neg Hx    Social History   Socioeconomic History   Marital status: Divorced    Spouse name: Not on file   Number of children: 3   Years of education: Not on file   Highest education level: 12th grade  Occupational History   Occupation: retired  Tobacco Use   Smoking status: Never   Smokeless tobacco: Never  Vaping Use   Vaping status: Never Used  Substance and Sexual Activity   Alcohol use: Yes    Comment: 2 cans hard cider/day   Drug use: No   Sexual activity: Not on file  Other Topics Concern   Not on file  Social History Narrative   Lives alone- can walk to her daughter's home   Daughter in [place]   Son in [place]   Son [place]   4 grandchildren (twin girls age 86)   Retired- Presenter, broadcasting at a Associate Professor x 25 years.   Divorced   4 cats and a dog   Enjoys antiquing.  Buys and sells.   Enjoys shopping   Social Drivers of Health   Financial Resource Strain: Low Risk  (11/15/2023)   Overall Financial Resource Strain (CARDIA)    Difficulty of Paying Living Expenses: Not hard at all  Food Insecurity: No Food Insecurity (11/15/2023)   Hunger Vital Sign    Worried About Running Out of Food in the Last Year:  Never true    Ran Out of Food in the Last Year: Never true  Transportation Needs: No Transportation Needs (11/15/2023)   PRAPARE - Administrator, Civil Service (Medical): No    Lack of Transportation (Non-Medical): No  Physical Activity: Sufficiently Active (11/15/2023)   Exercise Vital Sign    Days of Exercise per Week: 5 days    Minutes of Exercise per Session: 30 min  Recent Concern: Physical Activity - Insufficiently Active (10/10/2023)   Exercise Vital Sign    Days of Exercise per Week: 2 days    Minutes of Exercise per Session: 40 min  Stress: No Stress Concern Present (11/15/2023)   [employer] of Occupational Health - Occupational Stress Questionnaire    Feeling of Stress: Not at all  Social Connections: Socially Isolated (11/15/2023)   Social Connection and Isolation Panel    Frequency of Communication with Friends and Family: More than three times a week    Frequency of Social Gatherings with Friends and Family: More than three times a week    Attends Religious Services: Never    Database administrator or Organizations: No    Attends Engineer, structural: Never    Marital Status: Divorced    Tobacco Counseling Counseling given: Not Answered    Clinical Intake:  Pre-visit preparation completed: Yes  Pain : No/denies pain     BMI - recorded: 29.45 Nutritional Status: BMI > 30  Obese Nutritional Risks: None Diabetes: No  Lab Results  Component Value Date   HGBA1C 5.7 04/08/2020   HGBA1C 5.8 03/09/2018     How often do you need to have someone help you when you read instructions, pamphlets, or other written  materials from your doctor or pharmacy?: 1 - Never  Interpreter Needed?: No  Information entered by :: Rojelio Blush LPN   Activities of Daily Living     11/15/2023    1:24 PM  In your present state of health, do you have any difficulty performing the following activities:  Hearing? 0  Vision? 0  Difficulty concentrating or making decisions? 0  Walking or climbing stairs? 1  Comment Uses a Cane  Dressing or bathing? 0  Doing errands, shopping? 0  Preparing Food and eating ? N  Using the Toilet? N  In the past six months, have you accidently leaked urine? N  Do you have problems with loss of bowel control? N  Managing your Medications? N  Managing your Finances? N  Housekeeping or managing your Housekeeping? N    Patient Care Team: Daryl Setter, NP as PCP - General (Internal Medicine) Georjean Darice HERO, MD as Consulting Physician (Neurology)  I have updated your Care Teams any recent Medical Services you may have received from other providers in the past year.     Assessment:   This is a routine wellness examination for Judy Weiss.  Hearing/Vision screen Hearing Screening - Comments:: Denies hearing difficulties   Vision Screening - Comments:: Wears rx glasses - up to date with routine eye exams with  Deferred   Goals Addressed               This Visit's Progress     Increase physical activity (pt-stated)        Remain active.       Depression Screen     11/15/2023    1:22 PM 10/11/2023    3:06 PM 10/05/2022    4:49 PM 06/18/2022    2:23 PM  05/21/2022    3:07 PM 10/23/2021    3:51 PM 11/10/2020    2:30 PM  PHQ 2/9 Scores  PHQ - 2 Score 0 3 3 3  0 5 0  PHQ- 9 Score 0 8 6   13      Fall Risk     11/15/2023    1:25 PM 06/18/2022    2:20 PM 05/21/2022    3:06 PM 11/10/2020    2:29 PM 04/08/2020    3:09 PM  Fall Risk   Falls in the past year? 1 1 1 1 1   Number falls in past yr: 0 1 1 1 1   Injury with Fall? 0 1 1 1  0  Risk for fall due to : No Fall Risks  History of fall(s);Impaired balance/gait History of fall(s) History of fall(s);Impaired balance/gait   Follow up Falls evaluation completed Falls evaluation completed  Falls prevention discussed       Data saved with a previous flowsheet row definition    MEDICARE RISK AT HOME:  Medicare Risk at Home Any stairs in or around the home?: Yes If so, are there any without handrails?: No Home free of loose throw rugs in walkways, pet beds, electrical cords, etc?: Yes Adequate lighting in your home to reduce risk of falls?: Yes Life alert?: No Use of a cane, walker or w/c?: Yes Grab bars in the bathroom?: Yes Shower chair or bench in shower?: Yes Elevated toilet seat or a handicapped toilet?: No  TIMED UP AND GO:  Was the test performed?  No  Cognitive Function: 6CIT completed    10/06/2017    4:08 PM 08/24/2017   11:00 AM  MMSE - Mini Mental State Exam  Orientation to time 5 5  Orientation to Place 5 5  Registration 3 3  Attention/ Calculation 5 4  Recall 3 2  Language- name 2 objects 2 2  Language- repeat 1 1  Language- follow 3 step command 3 3  Language- read & follow direction 1 1  Write a sentence 1 1  Copy design 1 1  Total score 30 28      10/31/2018    2:00 PM 03/25/2017   11:00 AM  Montreal Cognitive Assessment   Visuospatial/ Executive (0/5) 5 5  Naming (0/3) 3 3  Attention: Read list of digits (0/2) 2 2  Attention: Read list of letters (0/1) 1 1  Attention: Serial 7 subtraction starting at 100 (0/3) 2 3  Language: Repeat phrase (0/2) 2 2  Language : Fluency (0/1) 0 0  Abstraction (0/2) 2 2  Delayed Recall (0/5) 4 4  Orientation (0/6) 6 6  Total 27 28      11/15/2023    1:26 PM 06/18/2022    2:28 PM 11/10/2020    2:39 PM  6CIT Screen  What Year? 0 points 0 points 0 points  What month? 0 points 0 points 0 points  What time? 0 points 0 points 0 points  Count back from 20 0 points 0 points 0 points  Months in reverse 0 points 0 points 0 points  Repeat  phrase 0 points 0 points 2 points  Total Score 0 points 0 points 2 points    Immunizations Immunization History  Administered Date(s) Administered   Fluad Quad(high Dose 65+) 04/08/2020, 02/20/2021   Fluad Trivalent(High Dose 65+) 02/25/2023   Influenza, High Dose Seasonal PF 07/04/2018   PFIZER(Purple Top)SARS-COV-2 Vaccination 05/10/2019, 05/31/2019, 02/06/2020, 11/03/2020   Pfizer Covid-19 Vaccine Bivalent Booster 47yrs &  up 04/16/2021   Pneumococcal Conjugate-13 07/04/2018   Pneumococcal Polysaccharide-23 02/20/2021    Screening Tests Health Maintenance  Topic Date Due   DTaP/Tdap/Td (1 - Tdap) Never done   Zoster Vaccines- Shingrix (1 of 2) Never done   COVID-19 Vaccine (6 - 2024-25 season) 12/19/2022   INFLUENZA VACCINE  11/18/2023   Medicare Annual Wellness (AWV)  11/14/2024   Pneumococcal Vaccine: 50+ Years  Completed   DEXA SCAN  Completed   Hepatitis B Vaccines  Aged Out   HPV VACCINES  Aged Out   Meningococcal B Vaccine  Aged Out   Colonoscopy  Discontinued    Health Maintenance  Health Maintenance Due  Topic Date Due   DTaP/Tdap/Td (1 - Tdap) Never done   Zoster Vaccines- Shingrix (1 of 2) Never done   COVID-19 Vaccine (6 - 2024-25 season) 12/19/2022   Health Maintenance Items Addressed:   Additional Screening:  Vision Screening: Recommended annual ophthalmology exams for early detection of glaucoma and other disorders of the eye. Would you like a referral to an eye doctor? No    Dental Screening: Recommended annual dental exams for proper oral hygiene  Community Resource Referral / Chronic Care Management: CRR required this visit?  No   CCM required this visit?  No   Plan:    I have personally reviewed and noted the following in the patient's chart:   Medical and social history Use of alcohol, tobacco or illicit drugs  Current medications and supplements including opioid prescriptions. Patient is not currently taking opioid  prescriptions. Functional ability and status Nutritional status Physical activity Advanced directives List of other physicians Hospitalizations, surgeries, and ER visits in previous 12 months Vitals Screenings to include cognitive, depression, and falls Referrals and appointments  In addition, I have reviewed and discussed with patient certain preventive protocols, quality metrics, and best practice recommendations. A written personalized care plan for preventive services as well as general preventive health recommendations were provided to patient.   Rojelio LELON Blush, LPN   2/[id number]/7974   After Visit Summary: (MyChart) Due to this being a telephonic visit, the after visit summary with patients personalized plan was offered to patient via MyChart   Notes: Nothing significant to report at this time.

## 2023-11-17 ENCOUNTER — Ambulatory Visit

## 2023-11-17 ENCOUNTER — Other Ambulatory Visit

## 2023-11-17 DIAGNOSIS — Z86718 Personal history of other venous thrombosis and embolism: Secondary | ICD-10-CM | POA: Diagnosis not present

## 2023-11-17 DIAGNOSIS — Z5181 Encounter for therapeutic drug level monitoring: Secondary | ICD-10-CM

## 2023-11-17 LAB — POCT INR: INR: 2.3 (ref 2.0–3.0)

## 2023-11-17 NOTE — Progress Notes (Signed)
 Pt here for INR check per Melissa  Goal INR = 2.0-3.0  Last INR = 1.7  Pt currently takes Coumadin  5mg  once daily except 7.5 mg (1.5 tabs) twice weekly on Thursdays and Sunday's. Pt advised she has stopped taking daily aspirin .   Pt denies recent antibiotics, no dietary changes and no unusual bruising / bleeding.  INR today = 2.3  Pt advised per Domenica no changes at the time and to follow up in 1 month.

## 2023-11-21 DIAGNOSIS — R2689 Other abnormalities of gait and mobility: Secondary | ICD-10-CM | POA: Diagnosis not present

## 2023-11-23 DIAGNOSIS — R2689 Other abnormalities of gait and mobility: Secondary | ICD-10-CM | POA: Diagnosis not present

## 2023-11-24 ENCOUNTER — Other Ambulatory Visit

## 2023-12-01 ENCOUNTER — Other Ambulatory Visit (INDEPENDENT_AMBULATORY_CARE_PROVIDER_SITE_OTHER)

## 2023-12-01 DIAGNOSIS — E785 Hyperlipidemia, unspecified: Secondary | ICD-10-CM | POA: Diagnosis not present

## 2023-12-02 ENCOUNTER — Ambulatory Visit: Payer: Self-pay | Admitting: Family

## 2023-12-02 LAB — LIPID PANEL
Cholesterol: 146 mg/dL (ref 0–200)
HDL: 53.8 mg/dL (ref >39.00)
LDL Cholesterol: 77 mg/dL (ref 0–99)
NonHDL: 92.25
Total CHOL/HDL Ratio: 3
Triglycerides: 77 mg/dL (ref 0.0–149.0)
VLDL: 15.4 mg/dL (ref 0.0–40.0)

## 2023-12-07 DIAGNOSIS — R2689 Other abnormalities of gait and mobility: Secondary | ICD-10-CM | POA: Diagnosis not present

## 2023-12-15 DIAGNOSIS — R2689 Other abnormalities of gait and mobility: Secondary | ICD-10-CM | POA: Diagnosis not present

## 2023-12-21 ENCOUNTER — Ambulatory Visit

## 2023-12-21 ENCOUNTER — Other Ambulatory Visit (HOSPITAL_BASED_OUTPATIENT_CLINIC_OR_DEPARTMENT_OTHER): Payer: Self-pay

## 2023-12-21 DIAGNOSIS — Z86718 Personal history of other venous thrombosis and embolism: Secondary | ICD-10-CM | POA: Diagnosis not present

## 2023-12-21 DIAGNOSIS — Z5181 Encounter for therapeutic drug level monitoring: Secondary | ICD-10-CM

## 2023-12-21 LAB — POCT INR: INR: 2.7 (ref 2.0–3.0)

## 2023-12-21 MED ORDER — FLUZONE HIGH-DOSE 0.5 ML IM SUSY
0.5000 mL | PREFILLED_SYRINGE | Freq: Once | INTRAMUSCULAR | 0 refills | Status: AC
  Filled 2023-12-21: qty 0.5, 1d supply, fill #0

## 2023-12-21 NOTE — Progress Notes (Signed)
 Pt here for INR check per Melissa  Goal INR = 2.0-3.0   Last INR = 2.3  Pt currently takes Coumadin  Coumadin  5mg  once daily except 7.5 mg (1.5 tabs) twice weekly on Thursdays and Sunday's   Pt denies recent antibiotics, no dietary changes and no unusual bruising / bleeding.  INR today = 2.7  Pt advised per Melissa, no changes. F/u in 1 month

## 2023-12-22 ENCOUNTER — Other Ambulatory Visit

## 2023-12-23 ENCOUNTER — Other Ambulatory Visit: Payer: Self-pay | Admitting: Gastroenterology

## 2023-12-30 DIAGNOSIS — R2689 Other abnormalities of gait and mobility: Secondary | ICD-10-CM | POA: Diagnosis not present

## 2024-01-11 DIAGNOSIS — R2689 Other abnormalities of gait and mobility: Secondary | ICD-10-CM | POA: Diagnosis not present

## 2024-01-12 ENCOUNTER — Other Ambulatory Visit: Payer: Self-pay | Admitting: Family

## 2024-01-12 DIAGNOSIS — Z86718 Personal history of other venous thrombosis and embolism: Secondary | ICD-10-CM

## 2024-01-16 ENCOUNTER — Other Ambulatory Visit: Payer: Self-pay | Admitting: Family

## 2024-01-16 DIAGNOSIS — G4700 Insomnia, unspecified: Secondary | ICD-10-CM

## 2024-01-20 ENCOUNTER — Other Ambulatory Visit: Payer: Self-pay | Admitting: Family

## 2024-01-20 ENCOUNTER — Ambulatory Visit (INDEPENDENT_AMBULATORY_CARE_PROVIDER_SITE_OTHER): Admitting: *Deleted

## 2024-01-20 DIAGNOSIS — Z7901 Long term (current) use of anticoagulants: Secondary | ICD-10-CM | POA: Diagnosis not present

## 2024-01-20 DIAGNOSIS — F32A Depression, unspecified: Secondary | ICD-10-CM

## 2024-01-20 LAB — POCT INR: INR: 2.1 (ref 2.0–3.0)

## 2024-01-20 NOTE — Progress Notes (Signed)
 Pt here for INR check per Melissa   Goal INR = 2.0-3.0    Last INR = 2.7   Pt currently takes Coumadin  5mg  once daily except 7.5 mg (1.5 tabs) twice weekly on Thursdays and Sunday's    Pt denies recent antibiotics, no dietary changes and no unusual bruising / bleeding.   INR today = 2.1   Pt advised per Dr. Amon, no changes. F/u in 1 month

## 2024-02-09 ENCOUNTER — Other Ambulatory Visit: Payer: Self-pay | Admitting: Family

## 2024-02-09 DIAGNOSIS — F32A Depression, unspecified: Secondary | ICD-10-CM

## 2024-02-14 ENCOUNTER — Other Ambulatory Visit: Payer: Self-pay | Admitting: Gastroenterology

## 2024-02-14 ENCOUNTER — Ambulatory Visit: Admitting: Family

## 2024-02-14 VITALS — BP 123/66 | HR 67 | Temp 98.5°F | Resp 16 | Ht 62.0 in | Wt 159.2 lb

## 2024-02-14 DIAGNOSIS — M25562 Pain in left knee: Secondary | ICD-10-CM | POA: Insufficient documentation

## 2024-02-14 DIAGNOSIS — R202 Paresthesia of skin: Secondary | ICD-10-CM

## 2024-02-14 DIAGNOSIS — R739 Hyperglycemia, unspecified: Secondary | ICD-10-CM

## 2024-02-14 DIAGNOSIS — I872 Venous insufficiency (chronic) (peripheral): Secondary | ICD-10-CM

## 2024-02-14 DIAGNOSIS — Z86718 Personal history of other venous thrombosis and embolism: Secondary | ICD-10-CM

## 2024-02-14 NOTE — Assessment & Plan Note (Signed)
 Discussed importance of compression stockings.
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
## 2024-02-14 NOTE — Assessment & Plan Note (Signed)
  Left knee pain, likely osteoarthritis or Baker's cyst Chronic pain behind left knee, not improved with physical therapy. Possible causes include osteoarthritis or enlarged Baker's cyst.  - Recommend Tylenol  for pain management.  - Consider further testing or referral to orthopedics if pain persists or worsens.

## 2024-02-14 NOTE — Patient Instructions (Signed)
 VISIT SUMMARY:  Today, we discussed the numbness and swelling in your feet, as well as the pain behind your left knee. We also reviewed your current medications and planned for routine follow-up care.  YOUR PLAN:  PERIPHERAL NEUROPATHY WITH LOWER EXTREMITY SWELLING: You have chronic numbness and swelling in your feet, which has been getting worse over time. Previous tests showed good circulation, so we are considering other causes like vitamin B12 deficiency or diabetes. -We will check your vitamin B12 and folate levels. -We will recheck your diabetes test. -Continue using compression stockings to help with the symptoms.  LEFT KNEE PAIN, LIKELY OSTEOARTHRITIS OR BAKER'S CYST: You have chronic pain behind your left knee that has not improved with physical therapy. This could be due to osteoarthritis or an enlarged Baker's cyst. -Take Tylenol  to help manage the pain. -If the pain persists or gets worse, we may need to do further testing or refer you to an orthopedic specialist.  FOLLOW-UP: We need to review your medications and update your prescriptions. Your mood is stable on Wellbutrin , but winter can be challenging for you. -Schedule a routine follow-up appointment before the end of the year to review your medications and update your Ambien  prescription.

## 2024-02-14 NOTE — Assessment & Plan Note (Addendum)
 Remains anticoagulated with coumadin . INR has been therapeutic.

## 2024-02-15 ENCOUNTER — Ambulatory Visit: Payer: Self-pay | Admitting: Family

## 2024-02-15 LAB — HEMOGLOBIN A1C: Hgb A1c MFr Bld: 6 % (ref 4.6–6.5)

## 2024-02-15 LAB — B12 AND FOLATE PANEL
Folate: 7.2 ng/mL (ref >5.9)
Vitamin B-12: 551 pg/mL (ref 211–911)

## 2024-02-21 ENCOUNTER — Ambulatory Visit (INDEPENDENT_AMBULATORY_CARE_PROVIDER_SITE_OTHER): Admitting: Family

## 2024-02-21 VITALS — BP 122/61 | HR 65 | Temp 98.4°F | Resp 16 | Ht 62.0 in | Wt 159.0 lb

## 2024-02-21 DIAGNOSIS — Z86718 Personal history of other venous thrombosis and embolism: Secondary | ICD-10-CM

## 2024-02-21 DIAGNOSIS — E785 Hyperlipidemia, unspecified: Secondary | ICD-10-CM

## 2024-02-21 DIAGNOSIS — F32A Depression, unspecified: Secondary | ICD-10-CM

## 2024-02-21 LAB — POCT INR: INR: 1.6 — AB (ref 2.0–3.0)

## 2024-02-21 MED ORDER — CITALOPRAM HYDROBROMIDE 40 MG PO TABS: 40.0000 mg | ORAL_TABLET | Freq: Every day | ORAL | Status: AC

## 2024-02-21 MED ORDER — WARFARIN SODIUM 5 MG PO TABS: ORAL_TABLET | ORAL | 0 refills | Status: AC

## 2024-02-21 NOTE — Progress Notes (Signed)
 Subjective:     Patient ID: Judy Weiss, female    DOB: [birth year], 78 y.o.   MRN: [id number]  Chief Complaint  Patient presents with   DVT    Here for follow up INR today    Insomnia    Here for follow up last [facility] 10/11/23    Insomnia    Discussed the use of AI scribe software for clinical note transcription with the patient, who gave verbal consent to proceed.  History of Present Illness Judy Weiss is an 78 year old female who presents for follow-up of her INR and medication management.  Her recent INR is 1.6. She takes Coumadin  (warfarin) 5 mg daily, with 7.5 mg on Thursdays and Sundays. She has not taken her dose today but took it last night. She requires a refill soon.  She is concerned about her cholesterol, which was previously in the 250 range. Atorvastatin  (Lipitor) has lowered it to 187. She worries about potential side effects, particularly neuropathy.  She reports improved sleep with magnesium supplementation, though she has difficulty falling asleep. She occasionally uses an antihistamine for sleep and is trying to reduce its use. She takes Wellbutrin  and citalopram  at maximum doses for mood, describing her mood as 'blah'. She is hesitant to see a psychiatrist.  Her family history includes diabetes in her mother and siblings, though she remains borderline. She experiences persistent pain behind her left knee but declines referral to orthopedics at this time. She has received her flu shot for the year.   Health Maintenance Due  Topic Date Due   DTaP/Tdap/Td (1 - Tdap) Never done   Zoster Vaccines- Shingrix (1 of 2) Never done   COVID-19 Vaccine (6 - 2025-26 season) 12/19/2023    Past Medical History:  Diagnosis Date   Allergy    food allergies and some additives. But she does not know what will cause.   Depression    GERD (gastroesophageal reflux disease)    had in past but now controlled. Has hiatal hernia.   Heart murmur    History of blood clots  1996?   left leg   History of colon polyps    History of hiatal hernia    Hx of blood clots    Hyperlipidemia    Osteoporosis    Status post dilation of esophageal narrowing     Past Surgical History:  Procedure Laterality Date   APPENDECTOMY  1972   DILATION AND CURETTAGE OF UTERUS     HIATAL HERNIA REPAIR  2016   INSERTION OF MESH N/A 01/09/2016   Procedure: INSERTION OF MESH;  Surgeon: Lynda Leos, MD;  Location: [facility];  Service: General;  Laterality: N/A;   KNEE SURGERY Bilateral    hx of torn meniscus (arthroscopic surgery)   LEG SURGERY Left 2001   Had veins stripped    SHOULDER SURGERY Right    reports hx arthroscopic surgery    Family History  Problem Relation Age of Onset   Diabetes Mother    Hypothyroidism Mother    Seizures Mother    Liver disease Neg Hx    Esophageal cancer Neg Hx    Colon cancer Neg Hx     Social History   Socioeconomic History   Marital status: Divorced    Spouse name: Not on file   Number of children: 3   Years of education: Not on file   Highest education level: 12th grade  Occupational History   Occupation: retired  Tobacco Use  Smoking status: Never   Smokeless tobacco: Never  Vaping Use   Vaping status: Never Used  Substance and Sexual Activity   Alcohol use: Yes    Comment: 2 cans hard cider/day   Drug use: No   Sexual activity: Not on file  Other Topics Concern   Not on file  Social History Narrative   Lives alone- can walk to her daughter's home   Daughter in [place]   Son in [place]   Son [place]   4 grandchildren (twin girls age 59)   Retired- presenter, broadcasting at a associate professor x 25 years.   Divorced   4 cats and a dog   Enjoys antiquing.  Buys and sells.  Enjoys shopping   Social Drivers of Health   Financial Resource Strain: Low Risk  (11/15/2023)   Overall Financial Resource Strain (CARDIA)    Difficulty of Paying Living Expenses: Not hard at all  Food Insecurity: No Food  Insecurity (11/15/2023)   Hunger Vital Sign    Worried About Running Out of Food in the Last Year: Never true    Ran Out of Food in the Last Year: Never true  Transportation Needs: No Transportation Needs (11/15/2023)   PRAPARE - Administrator, Civil Service (Medical): No    Lack of Transportation (Non-Medical): No  Physical Activity: Sufficiently Active (11/15/2023)   Exercise Vital Sign    Days of Exercise per Week: 5 days    Minutes of Exercise per Session: 30 min  Recent Concern: Physical Activity - Insufficiently Active (10/10/2023)   Exercise Vital Sign    Days of Exercise per Week: 2 days    Minutes of Exercise per Session: 40 min  Stress: No Stress Concern Present (11/15/2023)   [employer] of Occupational Health - Occupational Stress Questionnaire    Feeling of Stress: Not at all  Social Connections: Socially Isolated (11/15/2023)   Social Connection and Isolation Panel    Frequency of Communication with Friends and Family: More than three times a week    Frequency of Social Gatherings with Friends and Family: More than three times a week    Attends Religious Services: Never    Database Administrator or Organizations: No    Attends Banker Meetings: Never    Marital Status: Divorced  Catering Manager Violence: Not At Risk (11/15/2023)   Humiliation, Afraid, Rape, and Kick questionnaire    Fear of Current or Ex-Partner: No    Emotionally Abused: No    Physically Abused: No    Sexually Abused: No    Outpatient Medications Prior to Visit  Medication Sig Dispense Refill   atorvastatin  (LIPITOR) 20 MG tablet Take 1 tablet (20 mg total) by mouth daily. 90 tablet 1   buPROPion  (WELLBUTRIN  XL) 300 MG 24 hr tablet TAKE ONE TABLET BY MOUTH ONCE A DAY 90 tablet 0   Calcium  Carbonate-Vit D-Min (CALTRATE 600+D PLUS MINERALS) 600-800 MG-UNIT TABS Take 1 tablet by mouth in the morning and at bedtime.     clobetasol  ointment (TEMOVATE ) 0.05 % APPLY A THIN  LAYER TO THE AFFECTED AREA(S) TOPICALLY 2 TIMES PER DAY FOR 1 MONTH, THEN AS NEEDED 60 g 1   cyanocobalamin  1000 MCG tablet Take 1,000 mcg by mouth daily.     omeprazole  (PRILOSEC) 40 MG capsule TAKE 1 CAPSULE(40 MG) BY MOUTH DAILY 30 capsule 3   pantoprazole  (PROTONIX ) 40 MG tablet TAKE ONE TABLET BY MOUTH ONCE A DAY 30 tablet  0   VITAMIN D  PO Take 2,000 Units by mouth daily.     zolpidem  (AMBIEN ) 5 MG tablet TAKE 1 TABLET BY MOUTH EVERY NIGHT AT BEDTIME AS NEEDED FOR SLEEP 30 tablet 0   warfarin (COUMADIN ) 5 MG tablet TAKE AS DIRECTED BY COUMADIN  CLINIC 90 tablet 0   Facility-Administered Medications Prior to Visit  Medication Dose Route Frequency Provider Last Rate Last Admin   denosumab  (PROLIA ) injection 60 mg  60 mg Subcutaneous Once O'Sullivan, Danial Hlavac, NP        No Known Allergies  Review of Systems  Psychiatric/Behavioral:  The patient has insomnia.    See HPI    Objective:    Physical Exam Constitutional:      General: She is not in acute distress.    Appearance: Normal appearance. She is well-developed.  HENT:     Head: Normocephalic and atraumatic.     Right Ear: External ear normal.     Left Ear: External ear normal.  Eyes:     General: No scleral icterus. Neck:     Thyroid : No thyromegaly.  Cardiovascular:     Rate and Rhythm: Normal rate and regular rhythm.     Heart sounds: Normal heart sounds. No murmur heard. Pulmonary:     Effort: Pulmonary effort is normal. No respiratory distress.     Breath sounds: Normal breath sounds. No wheezing.  Musculoskeletal:     Cervical back: Neck supple.  Skin:    General: Skin is warm and dry.  Neurological:     Mental Status: She is alert and oriented to person, place, and time.  Psychiatric:        Mood and Affect: Mood normal.        Behavior: Behavior normal.        Thought Content: Thought content normal.        Judgment: Judgment normal.      BP 122/61 (BP Location: Right Arm, Patient Position: Sitting,  Cuff Size: Small)   Pulse 65   Temp 98.4 F (36.9 C) (Oral)   Resp 16   Ht 5' 2 (1.575 m)   Wt 159 lb (72.1 kg)   SpO2 100%   BMI 29.08 kg/m  Wt Readings from Last 3 Encounters:  02/21/24 159 lb (72.1 kg)  02/14/24 159 lb 3.2 oz (72.2 kg)  11/15/23 161 lb (73 kg)       Assessment & Plan:   Problem List Items Addressed This Visit       Unprioritized   Hyperlipidemia    Cholesterol at 250 mg/dL. 10-year cardiovascular risk at 28%, supporting statin use. Discussed that statin use can decrease risk of heart attack and stroke and neuropathy is not a typical side effect of atorvastatin . - Check cholesterol levels to evaluate atorvastatin  effect.       Relevant Medications   warfarin (COUMADIN ) 5 MG tablet   Other Relevant Orders   Lipid panel   Comp Met (CMET)   History of DVT (deep vein thrombosis) - Primary    INR subtherapeutic at 1.6, requiring Coumadin  dose adjustment. - Adjust Coumadin  to 5 mg and 7.5 mg alternating every other day. - Recheck INR in 10 days around March 02, 2024. - Refill Coumadin  at [facility].      Relevant Medications   warfarin (COUMADIN ) 5 MG tablet   Other Relevant Orders   POCT INR (Completed)   Depression      02/21/2024    2:28 PM 11/15/2023    1:22  PM 10/11/2023    3:06 PM  PHQ9 SCORE ONLY  PHQ-9 Total Score 6 0  8      Data saved with a previous flowsheet row definition   Mood appears fair on citalopram  and wellbutrin  xl.  Mood feels a bit blah. Recommended counseling or psych referral but she declines at this time.       Relevant Medications   citalopram  (CELEXA ) 40 MG tablet    I have changed Jaine Kilty's warfarin. I am also having her start on citalopram . Additionally, I am having her maintain her VITAMIN D  PO, cyanocobalamin , Caltrate 600+D Plus Minerals, clobetasol  ointment, omeprazole , atorvastatin , buPROPion , pantoprazole , and zolpidem . We will continue to administer denosumab .  Meds ordered this encounter   Medications   warfarin (COUMADIN ) 5 MG tablet    Sig: Take 5 mg once daily alternating with 7.5mg  once daily    Dispense:  135 tablet    Refill:  0    Supervising Provider:   DOMENICA BLACKBIRD A [[id number]]   citalopram  (CELEXA ) 40 MG tablet    Sig: Take 1 tablet (40 mg total) by mouth daily.    Supervising Provider:   DOMENICA BLACKBIRD A [id number]

## 2024-02-21 NOTE — Patient Instructions (Signed)
 VISIT SUMMARY:  Today, we reviewed your INR levels and adjusted your Coumadin  dosage. We also discussed your cholesterol management, mood, and knee pain. You received your flu shot for the year, and we planned your follow-up care.  YOUR PLAN:  VENOUS THROMBOEMBOLISM: Your INR level is currently below the therapeutic range at 1.6. -Adjust your Coumadin  dosage to 5 mg and 7.5 mg alternating every other day. -Recheck your INR in 10 days around March 02, 2024. -Refill your Coumadin  at [facility].  HYPERLIPIDEMIA: Your cholesterol level was previously high but has improved with atorvastatin . -Check your cholesterol levels to evaluate the effect of atorvastatin . -Discuss the benefits of atorvastatin  versus potential side effects.  DEPRESSION: Your mood is not well-controlled with your current medications. -Consider counseling or seeing a psychiatrist if you are open to it. -Take Wellbutrin  in the morning.  CHRONIC KNEE PAIN: You have persistent pain behind your knee. -No current plan for orthopedic referral as per your preference.  GENERAL HEALTH MAINTENANCE: You are up to date with your flu vaccination. -Schedule a follow-up appointment in four months. -Ensure you have a Coumadin  nurse appointment for INR monitoring.

## 2024-02-21 NOTE — Assessment & Plan Note (Signed)
  INR subtherapeutic at 1.6, requiring Coumadin  dose adjustment. - Adjust Coumadin  to 5 mg and 7.5 mg alternating every other day. - Recheck INR in 10 days around March 02, 2024. - Refill Coumadin  at [facility].

## 2024-02-21 NOTE — Assessment & Plan Note (Addendum)
    02/21/2024    2:28 PM 11/15/2023    1:22 PM 10/11/2023    3:06 PM  PHQ9 SCORE ONLY  PHQ-9 Total Score 6 0  8      Data saved with a previous flowsheet row definition   Mood appears fair on citalopram  and wellbutrin  xl.  Mood feels a bit blah. Recommended counseling or psych referral but she declines at this time.

## 2024-02-21 NOTE — Assessment & Plan Note (Signed)
  Cholesterol at 250 mg/dL. 10-year cardiovascular risk at 28%, supporting statin use. Discussed that statin use can decrease risk of heart attack and stroke and neuropathy is not a typical side effect of atorvastatin . - Check cholesterol levels to evaluate atorvastatin  effect.

## 2024-02-22 ENCOUNTER — Ambulatory Visit: Payer: Self-pay | Admitting: Family

## 2024-02-22 LAB — COMPREHENSIVE METABOLIC PANEL WITH GFR
ALT: 10 U/L (ref 0–35)
AST: 15 U/L (ref 0–37)
Albumin: 4.1 g/dL (ref 3.5–5.2)
Alkaline Phosphatase: 53 U/L (ref 39–117)
BUN: 21 mg/dL (ref 6–23)
CO2: 28 meq/L (ref 19–32)
Calcium: 8.9 mg/dL (ref 8.4–10.5)
Chloride: 107 meq/L (ref 96–112)
Creatinine, Ser: 0.79 mg/dL (ref 0.40–1.20)
GFR: 70.46 mL/min (ref >60.00)
Glucose, Bld: 87 mg/dL (ref 70–99)
Potassium: 4.8 meq/L (ref 3.5–5.1)
Sodium: 143 meq/L (ref 135–145)
Total Bilirubin: 0.4 mg/dL (ref 0.2–1.2)
Total Protein: 6.5 g/dL (ref 6.0–8.3)

## 2024-02-22 LAB — LIPID PANEL
Cholesterol: 156 mg/dL (ref 0–200)
HDL: 53.8 mg/dL (ref >39.00)
LDL Cholesterol: 88 mg/dL (ref 0–99)
NonHDL: 101.74
Total CHOL/HDL Ratio: 3
Triglycerides: 68 mg/dL (ref 0.0–149.0)
VLDL: 13.6 mg/dL (ref 0.0–40.0)

## 2024-03-21 ENCOUNTER — Telehealth: Payer: Self-pay | Admitting: *Deleted

## 2024-03-21 NOTE — Telephone Encounter (Unsigned)
 Copied from CRM #[id number]. Topic: Clinical - Request for Lab/Test Order >> Mar 21, 2024  3:58 PM Judy Weiss wrote: Reason for CRM: Patient called in requesting a lab appointment, would need orders in before can scheduled lab appointment

## 2024-03-21 NOTE — Telephone Encounter (Signed)
 We just did labs on 11/4 and they were normal. Is there something special she is wanting to check?

## 2024-03-22 ENCOUNTER — Other Ambulatory Visit: Payer: Self-pay | Admitting: Family

## 2024-03-22 DIAGNOSIS — F32A Depression, unspecified: Secondary | ICD-10-CM

## 2024-03-22 NOTE — Telephone Encounter (Signed)
 Patient notified of provider's comments. She will wait until visit to see if there is anything else she may need

## 2024-03-27 ENCOUNTER — Other Ambulatory Visit: Payer: Self-pay | Admitting: Family

## 2024-03-27 DIAGNOSIS — G4700 Insomnia, unspecified: Secondary | ICD-10-CM

## 2024-03-31 ENCOUNTER — Other Ambulatory Visit: Payer: Self-pay | Admitting: Family

## 2024-03-31 DIAGNOSIS — E785 Hyperlipidemia, unspecified: Secondary | ICD-10-CM

## 2024-05-06 ENCOUNTER — Emergency Department (HOSPITAL_BASED_OUTPATIENT_CLINIC_OR_DEPARTMENT_OTHER)
Admission: EM | Admit: 2024-05-06 | Discharge: 2024-05-06 | Disposition: A | Discharge status: Home / Self Care | Attending: Emergency Medicine | Admitting: Emergency Medicine

## 2024-05-06 ENCOUNTER — Emergency Department (HOSPITAL_BASED_OUTPATIENT_CLINIC_OR_DEPARTMENT_OTHER)

## 2024-05-06 ENCOUNTER — Encounter (HOSPITAL_BASED_OUTPATIENT_CLINIC_OR_DEPARTMENT_OTHER): Payer: Self-pay

## 2024-05-06 ENCOUNTER — Other Ambulatory Visit: Payer: Self-pay

## 2024-05-06 DIAGNOSIS — R443 Hallucinations, unspecified: Secondary | ICD-10-CM

## 2024-05-06 DIAGNOSIS — Z86718 Personal history of other venous thrombosis and embolism: Secondary | ICD-10-CM | POA: Insufficient documentation

## 2024-05-06 DIAGNOSIS — R531 Weakness: Secondary | ICD-10-CM | POA: Diagnosis not present

## 2024-05-06 DIAGNOSIS — R42 Dizziness and giddiness: Secondary | ICD-10-CM | POA: Diagnosis not present

## 2024-05-06 DIAGNOSIS — Z7901 Long term (current) use of anticoagulants: Secondary | ICD-10-CM | POA: Diagnosis not present

## 2024-05-06 DIAGNOSIS — R002 Palpitations: Secondary | ICD-10-CM | POA: Insufficient documentation

## 2024-05-06 DIAGNOSIS — R7989 Other specified abnormal findings of blood chemistry: Secondary | ICD-10-CM | POA: Insufficient documentation

## 2024-05-06 DIAGNOSIS — R442 Other hallucinations: Secondary | ICD-10-CM | POA: Diagnosis not present

## 2024-05-06 LAB — URINALYSIS, W/ REFLEX TO CULTURE (INFECTION SUSPECTED)
Bilirubin Urine: NEGATIVE
Glucose, UA: NEGATIVE mg/dL
Hgb urine dipstick: NEGATIVE
Ketones, ur: NEGATIVE mg/dL
Nitrite: NEGATIVE
Protein, ur: NEGATIVE mg/dL
Specific Gravity, Urine: 1.025 (ref 1.005–1.030)
pH: 7 (ref 5.0–8.0)

## 2024-05-06 LAB — CBC WITH DIFFERENTIAL/PLATELET
Abs Immature Granulocytes: 0.02 K/uL (ref 0.00–0.07)
Basophils Absolute: 0 K/uL (ref 0.0–0.1)
Basophils Relative: 1 %
Eosinophils Absolute: 0.1 K/uL (ref 0.0–0.5)
Eosinophils Relative: 2 %
HCT: 34.2 % — ABNORMAL LOW (ref 36.0–46.0)
Hemoglobin: 11.1 g/dL — ABNORMAL LOW (ref 12.0–15.0)
Immature Granulocytes: 0 %
Lymphocytes Relative: 23 %
Lymphs Abs: 1.1 K/uL (ref 0.7–4.0)
MCH: 28.1 pg (ref 26.0–34.0)
MCHC: 32.5 g/dL (ref 30.0–36.0)
MCV: 86.6 fL (ref 80.0–100.0)
Monocytes Absolute: 0.3 K/uL (ref 0.1–1.0)
Monocytes Relative: 7 %
Neutro Abs: 3.3 K/uL (ref 1.7–7.7)
Neutrophils Relative %: 67 %
Platelets: 202 K/uL (ref 150–400)
RBC: 3.95 MIL/uL (ref 3.87–5.11)
RDW: 12.7 % (ref 11.5–15.5)
WBC: 4.9 K/uL (ref 4.0–10.5)
nRBC: 0 % (ref 0.0–0.2)

## 2024-05-06 LAB — TROPONIN T, HIGH SENSITIVITY: Troponin T High Sensitivity: 18 ng/L (ref 0–19)

## 2024-05-06 LAB — COMPREHENSIVE METABOLIC PANEL WITH GFR
ALT: 10 U/L (ref 0–44)
AST: 20 U/L (ref 15–41)
Albumin: 3.8 g/dL (ref 3.5–5.0)
Alkaline Phosphatase: 69 U/L (ref 38–126)
Anion gap: 8 (ref 5–15)
BUN: 14 mg/dL (ref 8–23)
CO2: 27 mmol/L (ref 22–32)
Calcium: 9 mg/dL (ref 8.9–10.3)
Chloride: 105 mmol/L (ref 98–111)
Creatinine, Ser: 0.69 mg/dL (ref 0.44–1.00)
GFR, Estimated: >60 mL/min
Glucose, Bld: 105 mg/dL — ABNORMAL HIGH (ref 70–99)
Potassium: 3.9 mmol/L (ref 3.5–5.1)
Sodium: 140 mmol/L (ref 135–145)
Total Bilirubin: 0.5 mg/dL (ref 0.0–1.2)
Total Protein: 6.2 g/dL — ABNORMAL LOW (ref 6.5–8.1)

## 2024-05-06 LAB — PROTIME-INR
INR: 3.5 — ABNORMAL HIGH (ref 0.8–1.2)
Prothrombin Time: 36.9 s — ABNORMAL HIGH (ref 11.4–15.2)

## 2024-05-06 NOTE — ED Triage Notes (Signed)
 Pt arrives with c/o palpitations that started last night while pt was sitting down. Pt reports generalized weakness and during the episode pt reports her vision changed and it looked like there were spider webs in her vision. 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 Pt reports nausea.

## 2024-05-06 NOTE — Discharge Instructions (Addendum)
 Make an appointment to have close follow-up with your primary care doctor.  Your INR (Coumadin  level) was slightly elevated and needs to be rechecked closely by your doctor.  Return to the emergency room if you have any worsening symptoms.

## 2024-05-06 NOTE — ED Provider Notes (Signed)
 " [facility] Provider Note   CSN: [id number] Arrival date & time: 05/06/24  1711     Patient presents with: Palpitations   Loma Dubuque is a 79 y.o. female.   Patient is a 79 year old female who presents with weakness and palpitations.  She has a history of prior DVT and is on Coumadin , GERD, hyperlipidemia.  She said last night she was sitting in a chair and felt like her heart started racing.  This lasted about 5 minutes.  She got up and that had resolved but she was feeling weak all over and she started seeing spider webs.  She said they were very clear and she felt like she could reach out and touch them.  She said everything in the house seem to be in different places and she could not figure out how to get to her bed or how to get into her bed.  It is unclear whether she was just feeling weak and could not get up in her bed or whether she was having trouble figuring out how to get into her bed.  She did eventually get in the bed and went to sleep.  This morning and today she has felt a little weaker than normal and more tired.  She is not having any more hallucinations.  She does not have any other vision changes.  No speech deficits.  No chest discomfort or shortness of breath.  No fevers, cough or cold symptoms.  No urinary symptoms.  She does have some baseline difficulty with her balance and dizziness but thinks that it is a little bit worse today.  She did not have any sensation of worsening dizziness last night or that she may pass out.  She denies any history of similar symptoms in the past.  She did have some flulike symptoms about 1 to 2 weeks ago but did not take Tamiflu.  Those symptoms have resolved.  Her daughter is at bedside and says she does have some worsening memory issues but no significant problems with this.  She lives independently.       Prior to Admission medications  Medication Sig Start Date End Date Taking?  Authorizing Provider  atorvastatin  (LIPITOR) 20 MG tablet Take 1 tablet (20 mg total) by mouth daily. 04/02/24   O'Sullivan, Melissa, NP  buPROPion  (WELLBUTRIN  XL) 300 MG 24 hr tablet Take 1 tablet (300 mg total) by mouth daily. 03/22/24   O'Sullivan, Melissa, NP  Calcium  Carbonate-Vit D-Min (CALTRATE 600+D PLUS MINERALS) 600-800 MG-UNIT TABS Take 1 tablet by mouth in the morning and at bedtime. 03/29/23   O'Sullivan, Melissa, NP  citalopram  (CELEXA ) 40 MG tablet Take 1 tablet (40 mg total) by mouth daily. 02/21/24   O'Sullivan, Melissa, NP  clobetasol  ointment (TEMOVATE ) 0.05 % APPLY A THIN LAYER TO THE AFFECTED AREA(S) TOPICALLY 2 TIMES PER DAY FOR 1 MONTH, THEN AS NEEDED 04/06/23     cyanocobalamin  1000 MCG tablet Take 1,000 mcg by mouth daily.    [provider]  omeprazole  (PRILOSEC) 40 MG capsule TAKE 1 CAPSULE(40 MG) BY MOUTH DAILY 06/17/23   McMichael, Bayley M, PA-C  pantoprazole  (PROTONIX ) 40 MG tablet TAKE ONE TABLET BY MOUTH ONCE A DAY 12/23/23   Charlanne Groom, MD  VITAMIN D  PO Take 2,000 Units by mouth daily.    [provider]  warfarin (COUMADIN ) 5 MG tablet Take 5 mg once daily alternating with 7.5mg  once daily 02/21/24   O'Sullivan, Melissa, NP  zolpidem  (AMBIEN ) 5 MG tablet TAKE 1 TABLET BY MOUTH EVERY NIGHT AT BEDTIME AS NEEDED FOR SLEEP 03/27/24 09/23/24  O'Sullivan, Melissa, NP    Allergies: Patient has no known allergies.    Review of Systems  Constitutional:  Positive for fatigue. Negative for chills, diaphoresis and fever.  HENT:  Negative for congestion, rhinorrhea and sneezing.   Eyes: Negative.   Respiratory:  Negative for cough, chest tightness and shortness of breath.   Cardiovascular:  Negative for chest pain and leg swelling.  Gastrointestinal:  Negative for abdominal pain, diarrhea, nausea and vomiting.  Genitourinary:  Negative for difficulty urinating, flank pain and frequency.  Musculoskeletal:  Negative for arthralgias and back pain.  Skin:   Negative for rash.  Neurological:  Positive for dizziness and weakness (Generalized). Negative for speech difficulty, numbness and headaches.  Psychiatric/Behavioral:  Positive for hallucinations.     Updated Vital Signs BP 132/76 (BP Location: Right Arm)   Pulse 70   Temp 98.2 F (36.8 C)   Resp 18   SpO2 98%   Physical Exam Constitutional:      Appearance: She is well-developed.  HENT:     Head: Normocephalic and atraumatic.  Eyes:     Pupils: Pupils are equal, round, and reactive to light.  Cardiovascular:     Rate and Rhythm: Normal rate and regular rhythm.     Heart sounds: Normal heart sounds.  Pulmonary:     Effort: Pulmonary effort is normal. No respiratory distress.     Breath sounds: Normal breath sounds. No wheezing or rales.  Chest:     Chest wall: No tenderness.  Abdominal:     General: Bowel sounds are normal.     Palpations: Abdomen is soft.     Tenderness: There is no abdominal tenderness. There is no guarding or rebound.  Musculoskeletal:        General: Normal range of motion.     Cervical back: Normal range of motion and neck supple.  Lymphadenopathy:     Cervical: No cervical adenopathy.  Skin:    General: Skin is warm and dry.     Findings: No rash.  Neurological:     Mental Status: She is alert and oriented to person, place, and time.     Comments: Motor 5/5 all extremities Sensation grossly intact to LT all extremities Finger to Nose intact, no pronator drift CN II-XII grossly intact Visual fields full to confrontation      (all labs ordered are listed, but only abnormal results are displayed) Labs Reviewed  COMPREHENSIVE METABOLIC PANEL WITH GFR - Abnormal; Notable for the following components:      Result Value   Glucose, Bld 105 (*)    Total Protein 6.2 (*)    All other components within normal limits  CBC WITH DIFFERENTIAL/PLATELET - Abnormal; Notable for the following components:   Hemoglobin 11.1 (*)    HCT 34.2 (*)    All  other components within normal limits  URINALYSIS, W/ REFLEX TO CULTURE (INFECTION SUSPECTED) - Abnormal; Notable for the following components:   Leukocytes,Ua SMALL (*)    Bacteria, UA RARE (*)    All other components within normal limits  PROTIME-INR - Abnormal; Notable for the following components:   Prothrombin Time 36.9 (*)    INR 3.5 (*)    All other components within normal limits  URINE CULTURE  TROPONIN T, HIGH SENSITIVITY    EKG: EKG Interpretation Date/Time:  Sunday May 06 2024 17:22:22 EST Ventricular  Rate:  64 PR Interval:  136 QRS Duration:  142 QT Interval:  449 QTC Calculation: 464 R Axis:   52  Text Interpretation: Sinus rhythm Right bundle branch block since last tracing no significant change Confirmed by Lenor Hollering [phone]) on 05/06/2024 5:28:56 PM  Radiology: CT Head Wo Contrast Result Date: 05/06/2024 EXAM: CT HEAD WITHOUT CONTRAST 05/06/2024 06:16:00 PM TECHNIQUE: CT of the head was performed without the administration of intravenous contrast. Automated exposure control, iterative reconstruction, and/or weight based adjustment of the mA/kV was utilized to reduce the radiation dose to as low as reasonably achievable. COMPARISON: 05/21/2022 CLINICAL HISTORY: Mental status change, unknown cause. FINDINGS: BRAIN AND VENTRICLES: No acute hemorrhage. No evidence of acute infarct. No hydrocephalus. No extra-axial collection. No mass effect or midline shift. 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 ORBITS: No acute abnormality. SINUSES: No acute abnormality. SOFT TISSUES AND SKULL: No acute soft tissue abnormality. No skull fracture. IMPRESSION: 1. No acute intracranial abnormality. 2. 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 Electronically signed by: Franky Crease MD 05/06/2024 06:27 PM EST RP Workstation: [id number]     Procedures   Medications Ordered in the ED - No data to display                                   Medical Decision Making Amount and/or Complexity of Data Reviewed Labs: ordered. Radiology: ordered.   This patient presents to the ED for concern of palpitations, weakness, hallucinations, this involves an extensive number of treatment options, and is a complaint that carries with it a high risk of complications and morbidity.  I considered the following differential and admission for this acute, potentially life threatening condition.  The differential diagnosis includes arrhythmia, stroke, intracranial hemorrhage, brain mass, electrolyte abnormality, anemia, infection, TIA  MDM:    Patient is a 79 year old female who presents with an episode of palpitations last night in the sense that her heart was racing.  Following that she felt generally weak and felt a little more off balance.  She was also having what sounds like hallucinations and was having trouble figuring out how to get into bed.  She does not have any focal neurologic deficits.  Her gait is somewhat slow but no ataxia.  Labs reviewed and are nonconcerning.  Her INR is slightly elevated.  Her head CT does not show any acute abnormality.  EKG does not show any arrhythmias.  Discussed going to [facility] to get an MRI to definitively rule out a stroke.  However patient and family do not want to do this.  They will follow-up with her PCP on Monday.  I did advise that while I do not see any current stroke symptoms, she did have some concerns on the description that she was giving me and I cannot definitively rule out a stroke without an MRI.  They do express understanding of this.  They advised that they will call their doctor on Tuesday when office opens.  She was discharged home in good condition.  Her INR was slightly elevated I also advised her she needs to have this rechecked.  Return precautions were given.  (Labs, imaging, consults)  Labs: I Ordered, and personally interpreted labs.  The pertinent results  include: Normal CBC, no significant anemia, electrolytes nonconcerning  Imaging Studies ordered: I ordered imaging studies including CT head I independently visualized and  interpreted imaging. I agree with the radiologist interpretation  Additional history obtained from daughter at bedside.  External records from outside source obtained and reviewed including prior notes  Cardiac Monitoring: The patient was maintained on a cardiac monitor.  If on the cardiac monitor, I personally viewed and interpreted the cardiac monitored which showed an underlying rhythm of: Sinus rhythm  Reevaluation: After the interventions noted above, I reevaluated the patient and found that they have :improved  Social Determinants of Health:    Disposition: Discharged to home  Co morbidities that complicate the patient evaluation  Past Medical History:  Diagnosis Date   Allergy    food allergies and some additives. But she does not know what will cause.   Depression    GERD (gastroesophageal reflux disease)    had in past but now controlled. Has hiatal hernia.   Heart murmur    History of blood clots 1996?   left leg   History of colon polyps    History of hiatal hernia    Hx of blood clots    Hyperlipidemia    Osteoporosis    Status post dilation of esophageal narrowing      Medicines No orders of the defined types were placed in this encounter.   I have reviewed the patients home medicines and have made adjustments as needed  Problem List / ED Course: Problem List Items Addressed This Visit   None Visit Diagnoses       Palpitations    -  Primary     Hallucinations                    Final diagnoses:  Palpitations  Hallucinations    ED Discharge Orders     None          Lenor Hollering, MD 05/06/24 2037  "

## 2024-05-06 NOTE — ED Notes (Signed)
 Pt pacing and very anxious to leave; VS deferred at this time

## 2024-05-07 LAB — URINE CULTURE: Culture: NO GROWTH

## 2024-05-08 ENCOUNTER — Encounter: Payer: Self-pay | Admitting: Internal Medicine

## 2024-05-08 ENCOUNTER — Ambulatory Visit (INDEPENDENT_AMBULATORY_CARE_PROVIDER_SITE_OTHER): Admitting: Internal Medicine

## 2024-05-08 VITALS — BP 128/82 | HR 69 | Temp 98.4°F | Resp 16 | Ht 62.0 in | Wt 150.4 lb

## 2024-05-08 DIAGNOSIS — R002 Palpitations: Secondary | ICD-10-CM

## 2024-05-08 DIAGNOSIS — G459 Transient cerebral ischemic attack, unspecified: Secondary | ICD-10-CM | POA: Diagnosis not present

## 2024-05-08 DIAGNOSIS — R404 Transient alteration of awareness: Secondary | ICD-10-CM

## 2024-05-08 DIAGNOSIS — R634 Abnormal weight loss: Secondary | ICD-10-CM

## 2024-05-08 DIAGNOSIS — F1029 Alcohol dependence with unspecified alcohol-induced disorder: Secondary | ICD-10-CM

## 2024-05-08 DIAGNOSIS — Z7901 Long term (current) use of anticoagulants: Secondary | ICD-10-CM

## 2024-05-08 LAB — POCT INR: INR: 4.7 — AB (ref 2.0–3.0)

## 2024-05-08 NOTE — Progress Notes (Unsigned)
 "  Subjective:    Patient ID: Judy Weiss, female    DOB: February 05, 1946, 79 y.o.   MRN: [id number]  DOS:  05/08/2024 ER follow-up  Discussed the use of AI scribe software for clinical note transcription with the patient, who gave verbal consent to proceed.  History of Present Illness Judy Weiss is an 79 year old female who presents for a ER f/u here w/ her daughter Prior to the ER admission had a episode of palpitation without associated chest pain difficulty breathing or edema. It lasted couple of minutes, subsequently she felt slightly disoriented, mildly dizzy, saw spider webs. No head injury, no headache. Denies any stroke symptoms such as diplopia or slurred speech. Duration of  symptoms unclear. She was able to contact her daughter few hours after , then the daughter took her to the emergency room. At the time of the ER eval, the only symptom was profound fatigue.  The daughter did not notice any slurred speech or deficits.  Workup at thr ER 05/06/2024: UA with few bacteria, urine culture negative. CT head: No acute. CMP and CBC:  mild anemia with a hemoglobin of 11.1, previous 13.2 EKG: NSR, seems slightly different than the previous from 2018.  Also: - Takes Coumadin  7.5 mg daily - Does not take Coumadin  at a consistent time and occasionally forgets doses  She denies chest pain, dyspnea, leg swelling, slurred speech, facial numbness, weakness or paralysis, nausea, vomiting, blood in stool, recent head injury, or major headache  Substance use - Reduced alcohol intake from two eight-ounce cans to one per night - No use of tobacco, CBD, or other substances   Review of Systems See above   Past Medical History:  Diagnosis Date   Allergy    food allergies and some additives. But she does not know what will cause.   Depression    GERD (gastroesophageal reflux disease)    had in past but now controlled. Has hiatal hernia.   Heart murmur    History of blood clots  1996?   left leg   History of colon polyps    History of hiatal hernia    Hx of blood clots    Hyperlipidemia    Osteoporosis    Status post dilation of esophageal narrowing     Past Surgical History:  Procedure Laterality Date   APPENDECTOMY  1972   DILATION AND CURETTAGE OF UTERUS     HIATAL HERNIA REPAIR  2016   INSERTION OF MESH N/A 01/09/2016   Procedure: INSERTION OF MESH;  Surgeon: Lynda Leos, MD;  Location: [facility];  Service: General;  Laterality: N/A;   KNEE SURGERY Bilateral    hx of torn meniscus (arthroscopic surgery)   LEG SURGERY Left 2001   Had veins stripped    SHOULDER SURGERY Right    reports hx arthroscopic surgery    Current Outpatient Medications  Medication Instructions   atorvastatin  (LIPITOR) 20 mg, Oral, Daily   buPROPion  (WELLBUTRIN  XL) 300 mg, Oral, Daily   Calcium  Carbonate-Vit D-Min (CALTRATE 600+D PLUS MINERALS) 600-800 MG-UNIT TABS 1 tablet, Oral, 2 times daily   citalopram  (CELEXA ) 40 mg, Oral, Daily   clobetasol  ointment (TEMOVATE ) 0.05 % APPLY A THIN LAYER TO THE AFFECTED AREA(S) TOPICALLY 2 TIMES PER DAY FOR 1 MONTH, THEN AS NEEDED   cyanocobalamin  1,000 mcg, Daily   omeprazole  (PRILOSEC) 40 MG capsule TAKE 1 CAPSULE(40 MG) BY MOUTH DAILY   pantoprazole  (PROTONIX ) 40 mg, Oral, Daily   VITAMIN D   PO 2,000 Units, Daily   warfarin (COUMADIN ) 5 MG tablet Take 5 mg once daily alternating with 7.5mg  once daily   zolpidem  (AMBIEN ) 5 MG tablet Oral, At bedtime PRN, for sleep       Objective:   Physical Exam BP 128/82   Pulse 69   Temp 98.4 F (36.9 C) (Oral)   Resp 16   Ht 5' 2 (1.575 m)   Wt 150 lb 6 oz (68.2 kg)   SpO2 96%   BMI 27.50 kg/m  General: Well developed, NAD, BMI noted Neck: Normal carotid pulses HEENT:  Normocephalic . Face symmetric, atraumatic.  HOH Lungs:  CTA B Normal respiratory effort, no intercostal retractions, no accessory muscle use. Heart: RRR,  no murmur.  Lower extremities: no pretibial edema  bilaterally  Skin: Exposed areas without rash. Not pale. Not jaundice Neurologic:  alert & oriented X3.  Speech normal, gait appropriate for age and unassisted Strength symmetric and appropriate for age.  Psych: Cognition and judgment appear intact.  Cooperative with normal attention span and concentration.  Behavior appropriate. No anxious or depressed appearing.     Assessment    79 year old female, PMH includes high cholesterol, previous clots, on Coumadin , EtOH abuse, presents for ER follow-up  Single episode of palpitations: EKG at the ER shows sinus rhythm.  Troponin was negative.  No further symptoms Plan: Cardiology referral, rule out occult arrhythmia TIA?  Episode of mild confusion, dizziness.  CT head in the ER negative.  Neuroexam today is normal.  No memory deficits. Plan: MRI, neuroreferral.  B12 folic acid  Coumadin  management: Recent INR 3.5, INR today 4.7. Currently on Coumadin  7.5 mg daily. 52.5 mg a week Will confirm INR tomorrow without blood stick. Hold for 3 days, restart at 5 mg daily except Tuesday Friday 7.5 mg. Recheck in 2 weeks. EtOH: Drinks 1 beer a day.  Less than before Weight loss: Saw GI 2024 for dyspepsia and weight loss. CT abdomen showed no acute findings.  They rec EGD and a colonoscopy (not done to my knowledge). weight 2024: 170 pounds, 161 pounds.  Current weight 150 pounds. Check TSH Follow-up with PCP 1 month Time spent 40 minutes, extensive chart review, multiple issues addressed today "

## 2024-05-08 NOTE — Patient Instructions (Signed)
 Please read your instructions carefully.     Go to the front desk for the checkout Please make an appointment for blood work tomorrow.  No need to be fast Also arrange a visit with Melissa in 1 month   No Coumadin  for 3 days Then restart 5 mg every day, except Tuesday and Friday: 7.5 mg Arrange for Coumadin  check in 2 weeks  TIA ? Brain MRI, neurology referral  Palpitations: Cardiology referral   ER or call 911 if: Palpitations again, disorientation, any stroke symptoms

## 2024-05-09 ENCOUNTER — Encounter: Payer: Self-pay | Admitting: Internal Medicine

## 2024-05-10 ENCOUNTER — Other Ambulatory Visit: Payer: Self-pay | Admitting: Family

## 2024-05-10 ENCOUNTER — Ambulatory Visit: Payer: Self-pay | Admitting: Internal Medicine

## 2024-05-10 ENCOUNTER — Other Ambulatory Visit (INDEPENDENT_AMBULATORY_CARE_PROVIDER_SITE_OTHER)

## 2024-05-10 DIAGNOSIS — R634 Abnormal weight loss: Secondary | ICD-10-CM

## 2024-05-10 DIAGNOSIS — Z7901 Long term (current) use of anticoagulants: Secondary | ICD-10-CM

## 2024-05-10 DIAGNOSIS — F1029 Alcohol dependence with unspecified alcohol-induced disorder: Secondary | ICD-10-CM | POA: Diagnosis not present

## 2024-05-10 DIAGNOSIS — G4700 Insomnia, unspecified: Secondary | ICD-10-CM

## 2024-05-10 LAB — PROTIME-INR
INR: 3.5 ratio — ABNORMAL HIGH (ref 0.8–1.0)
Prothrombin Time: 36 s — ABNORMAL HIGH (ref 9.6–13.1)

## 2024-05-10 NOTE — Telephone Encounter (Signed)
 Requesting: Ambien   Contract: 11/05/21 UDS: 07/15/22 Last Visit: 02/21/24 Next Visit: None Last Refill: 03/27/24 #30 and 0RF   Please Advise

## 2024-05-11 LAB — TSH: TSH: 3.36 u[IU]/mL (ref 0.35–5.50)

## 2024-05-11 LAB — B12 AND FOLATE PANEL
Folate: 8.1 ng/mL
Vitamin B-12: 636 pg/mL (ref 211–911)

## 2024-05-15 NOTE — Progress Notes (Signed)
 Patient contacted and scheduled to come in 05/29/24 for [place] INR check

## 2024-05-15 NOTE — Progress Notes (Signed)
 Called patient but no answer, left voice mail for patient to call back.

## 2024-05-25 ENCOUNTER — Ambulatory Visit: Admission: RE | Admit: 2024-05-25 | Source: Ambulatory Visit

## 2024-05-29 ENCOUNTER — Ambulatory Visit

## 2024-06-13 ENCOUNTER — Ambulatory Visit: Admitting: Family

## 2024-11-20 ENCOUNTER — Ambulatory Visit
